# Patient Record
Sex: Female | Born: 1945 | Race: White | Hispanic: No | Marital: Married | Smoking: Former smoker
Health system: Southern US, Community
[De-identification: ages and names within clinical notes are randomized; demographics above are authoritative.]

## PROBLEM LIST (undated history)

## (undated) DIAGNOSIS — J189 Pneumonia, unspecified organism: Secondary | ICD-10-CM

## (undated) DIAGNOSIS — E785 Hyperlipidemia, unspecified: Secondary | ICD-10-CM

## (undated) DIAGNOSIS — C801 Malignant (primary) neoplasm, unspecified: Secondary | ICD-10-CM

## (undated) DIAGNOSIS — F32A Depression, unspecified: Secondary | ICD-10-CM

## (undated) DIAGNOSIS — J449 Chronic obstructive pulmonary disease, unspecified: Secondary | ICD-10-CM

## (undated) DIAGNOSIS — I10 Essential (primary) hypertension: Secondary | ICD-10-CM

## (undated) DIAGNOSIS — M1990 Unspecified osteoarthritis, unspecified site: Secondary | ICD-10-CM

## (undated) DIAGNOSIS — F329 Major depressive disorder, single episode, unspecified: Secondary | ICD-10-CM

## (undated) DIAGNOSIS — Z8719 Personal history of other diseases of the digestive system: Secondary | ICD-10-CM

## (undated) HISTORY — PX: CATARACT EXTRACTION: SUR2

## (undated) HISTORY — PX: COLONOSCOPY: SHX174

## (undated) HISTORY — PX: TONSILLECTOMY: SUR1361

---

## 1997-05-28 HISTORY — PX: CARDIAC CATHETERIZATION: SHX172

## 2000-01-29 ENCOUNTER — Emergency Department (HOSPITAL_COMMUNITY): Admission: EM | Admit: 2000-01-29 | Discharge: 2000-01-29 | Payer: Self-pay | Admitting: Emergency Medicine

## 2000-01-31 ENCOUNTER — Emergency Department (HOSPITAL_COMMUNITY): Admission: EM | Admit: 2000-01-31 | Discharge: 2000-01-31 | Payer: Self-pay

## 2000-05-28 HISTORY — PX: CERVICAL FUSION: SHX112

## 2001-04-21 ENCOUNTER — Encounter: Payer: Self-pay | Admitting: Family Medicine

## 2001-04-21 ENCOUNTER — Encounter: Admission: RE | Admit: 2001-04-21 | Discharge: 2001-04-21 | Payer: Self-pay | Admitting: Family Medicine

## 2001-05-15 ENCOUNTER — Observation Stay (HOSPITAL_COMMUNITY): Admission: RE | Admit: 2001-05-15 | Discharge: 2001-05-16 | Payer: Self-pay | Admitting: Neurosurgery

## 2001-07-01 ENCOUNTER — Encounter: Admission: RE | Admit: 2001-07-01 | Discharge: 2001-09-29 | Payer: Self-pay | Admitting: Neurosurgery

## 2005-06-25 ENCOUNTER — Encounter: Admission: RE | Admit: 2005-06-25 | Discharge: 2005-09-23 | Payer: Self-pay | Admitting: Nurse Practitioner

## 2005-11-21 ENCOUNTER — Other Ambulatory Visit: Admission: RE | Admit: 2005-11-21 | Discharge: 2005-11-21 | Payer: Self-pay | Admitting: *Deleted

## 2005-12-13 ENCOUNTER — Encounter: Admission: RE | Admit: 2005-12-13 | Discharge: 2005-12-13 | Payer: Self-pay | Admitting: Family Medicine

## 2008-06-03 ENCOUNTER — Other Ambulatory Visit: Admission: RE | Admit: 2008-06-03 | Discharge: 2008-06-03 | Payer: Self-pay | Admitting: Family Medicine

## 2009-06-17 ENCOUNTER — Encounter: Admission: RE | Admit: 2009-06-17 | Discharge: 2009-06-17 | Payer: Self-pay | Admitting: Orthopedic Surgery

## 2009-06-20 ENCOUNTER — Encounter: Admission: RE | Admit: 2009-06-20 | Discharge: 2009-06-20 | Payer: Self-pay | Admitting: Family Medicine

## 2009-12-13 ENCOUNTER — Other Ambulatory Visit: Admission: RE | Admit: 2009-12-13 | Discharge: 2009-12-13 | Payer: Self-pay | Admitting: Family Medicine

## 2010-10-13 NOTE — Op Note (Signed)
[place]  Patient:    [name], [name] Visit Number: [id number] MRN: [id number]          Service Type: SUR Location: [id number] Attending Physician:  Cristi Loron Dictated by:   Cristi Loron, M.D. Proc. Date: 05/15/01 Admit Date:  05/15/2001 Discharge Date: 05/16/2001                             Operative Report  PREOPERATIVE DIAGNOSES:  C5-6 herniated nucleus pulposus, spinal stenosis, degenerative disk disease, cervical myelopathy, cervicalgia.  POSTOPERATIVE DIAGNOSES:  C5-6 herniated nucleus pulposus, spinal stenosis, degenerative disk disease, cervical myelopathy, cervicalgia.  PROCEDURE:  C5-6 extensive anterior cervical diskectomy, interbody iliac crest allograft arthrodesis, anterior cervical plating (Codman titanium plate and screws).  SURGEON:  Cristi Loron, M.D.  ANESTHESIA:  General endotracheal.  ESTIMATED BLOOD LOSS:  100 cc.  SPECIMENS:  None.  DRAINS:  None.  COMPLICATIONS:  None.  BRIEF HISTORY:  The patient is a 65 year old white female who has suffered from neck and bilateral arm pain.  She failed medical management and was worked up with a cervical MRI that demonstrated a large herniated disk at C5-6 with spinal cord gliosis at that level.  I recommended surgery.  The patient weighed the risks, benefits, and alternatives of surgery and decided to proceed with the operation.  DESCRIPTION OF PROCEDURE:  The patient was brought to the operating room by the anesthesia team.  General endotracheal anesthesia was induced.  The patient remained in supine position.  A roll was placed under her shoulder to place her neck in slight extension.  Her anterior cervical region was then prepared with Betadine scrub and Betadine solution, sterile drapes were applied.  I then injected the area to be incised with Marcaine with epinephrine solution and used a scalpel to make a transverse incision in the patients  left anterior neck.  I used the Metzenbaum scissors to divide the platysma muscle and then to dissect medial to the sternocleidomastoid muscle, jugular vein, and carotid artery.  I bluntly dissected down toward the anterior cervical spine, carefully identifying the esophagus and retracting it medially.  I cleared the soft tissue from the anterior cervical spine using Kitner swabs.  I inserted a bent spinal needle into the upper exposed interspace and then obtained the intraoperative radiograph to confirm the location.  I then used electrocautery to detach the medial border of the longus colli muscle bilaterally from the C5-6 interspace.  I inserted the Caspar self-retaining retractor for exposure and then used the 15 blade scalpel to incise the C5-6 intervertebral disk.  I performed a partial diskectomy using the pituitary forceps and the Karlin curettes.  I then inserted the distraction screws at C5 and C6 and distracted the C5-6 interspace.  I used the high-speed drill to decorticate the vertebral end plates at C5 and C6 and drill away the remainder of the C5-6 intervertebral disk.  I thinned out the posterior longitudinal ligament with a drill and then incised it with the arachnoid knife.  There was a huge central disk herniation, which I removed with the pituitary forceps.  I then removed the remainder of the posterior longitudinal ligament with the Kerrison punch, undercutting the vertebral end plates, decompressing the thecal sac.  I performed a foraminotomy about the bilateral C6 nerve roots.  Having completed the decompression, I now turned my attention to the arthrodesis.  I obtained the iliac crest  tricortical allograft bone graft and fashioned it to these approximate dimensions:  7 mm in height, 1 cm in depth. I inserted the bone graft in the distracted interspace and then removed the distraction screws.  There was a good, snug fit of the bone graft.  I now turned my attention  to anterior spinal instrumentation.  I obtained the appropriate length Codman anterior cervical plate and laid it along the anterior aspect of the vertebral bodies at C5 and C6, drilled two holes at C5, two at C6, tapped the holes, then secured the plate to the vertebral bodies with two 12 mm screws at each level.  I then obtained the intraoperative radiograph that demonstrated good position of the plate, screws, and interbody graft, and then secured these screws to the plate using the cam tightener.  I then achieved stringent hemostasis using bipolar electrocautery and Gelfoam. I copiously irrigated the wound out with bacitracin solution and removed the solution, then removed the Caspar self-retaining retractor.  I inspected the esophagus for any damage.  There was none evident.  I then reapproximated the patients platysma muscle with interrupted 3-0 Vicryl suture, the subcutaneous tissue with interrupted 3-0 Vicryl suture, and the skin with Steri-Strips and benzoin.  The wound was then coated with bacitracin ointment and sterile dressings applied.  The drapes were removed.  The patient was subsequently extubated by the anesthesia team and transported to the postanesthesia care unit in stable condition.  All sponge, instrument, and needle counts were correct at the end of this case. Dictated by:   Cristi Loron, M.D. Attending Physician:  Tressie Stalker D DD:  05/15/01 TD:  05/17/01 Job: [id number]

## 2011-04-13 ENCOUNTER — Other Ambulatory Visit: Payer: Self-pay | Admitting: Orthopedic Surgery

## 2011-04-13 ENCOUNTER — Ambulatory Visit
Admission: RE | Admit: 2011-04-13 | Discharge: 2011-04-13 | Disposition: A | Discharge status: Home / Self Care | Payer: Self-pay | Source: Ambulatory Visit | Attending: Orthopedic Surgery | Admitting: Orthopedic Surgery

## 2011-04-13 DIAGNOSIS — M79641 Pain in right hand: Secondary | ICD-10-CM

## 2011-06-29 HISTORY — PX: CARPAL TUNNEL RELEASE: SHX101

## 2011-08-10 ENCOUNTER — Encounter (HOSPITAL_BASED_OUTPATIENT_CLINIC_OR_DEPARTMENT_OTHER): Payer: Self-pay | Admitting: *Deleted

## 2011-08-10 NOTE — Progress Notes (Signed)
Had rt ctr [facility] 2/13-they did not do ekg To come in for ekg,bmet

## 2011-08-13 ENCOUNTER — Encounter (HOSPITAL_BASED_OUTPATIENT_CLINIC_OR_DEPARTMENT_OTHER)
Admission: RE | Admit: 2011-08-13 | Discharge: 2011-08-13 | Disposition: A | Discharge status: Home / Self Care | Payer: BC Managed Care – PPO | Source: Ambulatory Visit | Attending: Orthopedic Surgery | Admitting: Orthopedic Surgery

## 2011-08-13 LAB — BASIC METABOLIC PANEL
CO2: 29 mEq/L (ref 19–32)
Chloride: 98 mEq/L (ref 96–112)
GFR calc non Af Amer: >90 mL/min (ref >90)
Glucose, Bld: 159 mg/dL — ABNORMAL HIGH (ref 70–99)
Potassium: 4 mEq/L (ref 3.5–5.1)

## 2011-08-15 ENCOUNTER — Encounter (HOSPITAL_BASED_OUTPATIENT_CLINIC_OR_DEPARTMENT_OTHER): Payer: Self-pay | Admitting: *Deleted

## 2011-08-15 ENCOUNTER — Ambulatory Visit (HOSPITAL_BASED_OUTPATIENT_CLINIC_OR_DEPARTMENT_OTHER)
Admission: RE | Admit: 2011-08-15 | Discharge: 2011-08-15 | Disposition: A | Discharge status: Home / Self Care | Payer: BC Managed Care – PPO | Source: Ambulatory Visit | Attending: Orthopedic Surgery | Admitting: Orthopedic Surgery

## 2011-08-15 ENCOUNTER — Encounter (HOSPITAL_BASED_OUTPATIENT_CLINIC_OR_DEPARTMENT_OTHER)
Admission: RE | Disposition: A | Discharge status: Home / Self Care | Payer: Self-pay | Source: Ambulatory Visit | Attending: Orthopedic Surgery

## 2011-08-15 ENCOUNTER — Encounter (HOSPITAL_BASED_OUTPATIENT_CLINIC_OR_DEPARTMENT_OTHER): Payer: Self-pay | Admitting: Anesthesiology

## 2011-08-15 ENCOUNTER — Ambulatory Visit (HOSPITAL_BASED_OUTPATIENT_CLINIC_OR_DEPARTMENT_OTHER): Payer: BC Managed Care – PPO | Admitting: Anesthesiology

## 2011-08-15 DIAGNOSIS — M7130 Other bursal cyst, unspecified site: Secondary | ICD-10-CM | POA: Insufficient documentation

## 2011-08-15 DIAGNOSIS — Z01810 Encounter for preprocedural cardiovascular examination: Secondary | ICD-10-CM | POA: Insufficient documentation

## 2011-08-15 DIAGNOSIS — E119 Type 2 diabetes mellitus without complications: Secondary | ICD-10-CM | POA: Insufficient documentation

## 2011-08-15 DIAGNOSIS — F3289 Other specified depressive episodes: Secondary | ICD-10-CM | POA: Insufficient documentation

## 2011-08-15 DIAGNOSIS — I10 Essential (primary) hypertension: Secondary | ICD-10-CM | POA: Insufficient documentation

## 2011-08-15 DIAGNOSIS — E785 Hyperlipidemia, unspecified: Secondary | ICD-10-CM | POA: Insufficient documentation

## 2011-08-15 DIAGNOSIS — M129 Arthropathy, unspecified: Secondary | ICD-10-CM | POA: Insufficient documentation

## 2011-08-15 DIAGNOSIS — G5602 Carpal tunnel syndrome, left upper limb: Secondary | ICD-10-CM

## 2011-08-15 DIAGNOSIS — J45909 Unspecified asthma, uncomplicated: Secondary | ICD-10-CM | POA: Insufficient documentation

## 2011-08-15 DIAGNOSIS — G5600 Carpal tunnel syndrome, unspecified upper limb: Secondary | ICD-10-CM | POA: Insufficient documentation

## 2011-08-15 DIAGNOSIS — F329 Major depressive disorder, single episode, unspecified: Secondary | ICD-10-CM | POA: Insufficient documentation

## 2011-08-15 HISTORY — DX: Essential (primary) hypertension: I10

## 2011-08-15 HISTORY — DX: Major depressive disorder, single episode, unspecified: F32.9

## 2011-08-15 HISTORY — DX: Depression, unspecified: F32.A

## 2011-08-15 HISTORY — DX: Hyperlipidemia, unspecified: E78.5

## 2011-08-15 HISTORY — PX: CARPAL TUNNEL RELEASE: SHX101

## 2011-08-15 HISTORY — DX: Unspecified osteoarthritis, unspecified site: M19.90

## 2011-08-15 LAB — GLUCOSE, CAPILLARY: Glucose-Capillary: 150 mg/dL — ABNORMAL HIGH (ref 70–99)

## 2011-08-15 SURGERY — CARPAL TUNNEL RELEASE
Anesthesia: General | Site: Wrist | Laterality: Left | Wound class: Clean

## 2011-08-15 MED ORDER — FENTANYL CITRATE 0.05 MG/ML IJ SOLN: 50.0000 ug | INTRAMUSCULAR | Status: DC | PRN

## 2011-08-15 MED ORDER — BUPIVACAINE HCL (PF) 0.5 % IJ SOLN
INTRAMUSCULAR | Status: DC | PRN
  Administered 2011-08-15: 5 mL

## 2011-08-15 MED ORDER — CHLORHEXIDINE GLUCONATE 4 % EX LIQD: 60.0000 mL | Freq: Once | CUTANEOUS | Status: DC

## 2011-08-15 MED ORDER — CEFAZOLIN SODIUM 1-5 GM-% IV SOLN: 1.0000 g | INTRAVENOUS | Status: DC

## 2011-08-15 MED ORDER — METOCLOPRAMIDE HCL 5 MG/ML IJ SOLN
INTRAMUSCULAR | Status: DC | PRN
  Administered 2011-08-15: 10 mg via INTRAVENOUS

## 2011-08-15 MED ORDER — MIDAZOLAM HCL 2 MG/2ML IJ SOLN: 1.0000 mg | INTRAMUSCULAR | Status: DC | PRN

## 2011-08-15 MED ORDER — LIDOCAINE HCL (PF) 1 % IJ SOLN
INTRAMUSCULAR | Status: DC | PRN
  Administered 2011-08-15: 5 mL

## 2011-08-15 MED ORDER — MIDAZOLAM HCL 5 MG/5ML IJ SOLN
INTRAMUSCULAR | Status: DC | PRN
  Administered 2011-08-15: 2 mg via INTRAVENOUS

## 2011-08-15 MED ORDER — LIDOCAINE HCL (CARDIAC) 20 MG/ML IV SOLN
INTRAVENOUS | Status: DC | PRN
  Administered 2011-08-15: 50 mg via INTRAVENOUS

## 2011-08-15 MED ORDER — FENTANYL CITRATE 0.05 MG/ML IJ SOLN
INTRAMUSCULAR | Status: DC | PRN
  Administered 2011-08-15 (×2): 50 ug via INTRAVENOUS

## 2011-08-15 MED ORDER — PROPOFOL 10 MG/ML IV EMUL
INTRAVENOUS | Status: DC | PRN
  Administered 2011-08-15: 120 mg via INTRAVENOUS

## 2011-08-15 MED ORDER — EPHEDRINE SULFATE 50 MG/ML IJ SOLN
INTRAMUSCULAR | Status: DC | PRN
  Administered 2011-08-15: 10 mg via INTRAVENOUS

## 2011-08-15 MED ORDER — LACTATED RINGERS IV SOLN
INTRAVENOUS | Status: DC
  Administered 2011-08-15 (×2): via INTRAVENOUS

## 2011-08-15 MED ORDER — ONDANSETRON HCL 4 MG/2ML IJ SOLN
INTRAMUSCULAR | Status: DC | PRN
  Administered 2011-08-15: 4 mg via INTRAVENOUS

## 2011-08-15 SURGICAL SUPPLY — 48 items
BANDAGE ELASTIC 3 VELCRO ST LF (GAUZE/BANDAGES/DRESSINGS) ×2 IMPLANT
BANDAGE GAUZE ELAST BULKY 4 IN (GAUZE/BANDAGES/DRESSINGS) ×2 IMPLANT
BLADE SURG 15 STRL LF DISP TIS (BLADE) ×1 IMPLANT
BLADE SURG 15 STRL SS (BLADE) ×2
BNDG CMPR 9X4 STRL LF SNTH (GAUZE/BANDAGES/DRESSINGS) ×1
BNDG ESMARK 4X9 LF (GAUZE/BANDAGES/DRESSINGS) ×2 IMPLANT
CLOTH BEACON ORANGE TIMEOUT ST (SAFETY) ×2 IMPLANT
CORDS BIPOLAR (ELECTRODE) ×1 IMPLANT
COVER MAYO STAND STRL (DRAPES) ×2 IMPLANT
COVER TABLE BACK 60X90 (DRAPES) ×2 IMPLANT
CUFF TOURNIQUET SINGLE 18IN (TOURNIQUET CUFF) ×1 IMPLANT
DRAIN PENROSE 1/4X12 LTX STRL (WOUND CARE) IMPLANT
DRAPE EXTREMITY T 121X128X90 (DRAPE) ×2 IMPLANT
DRAPE SURG 17X23 STRL (DRAPES) ×2 IMPLANT
DRSG EMULSION OIL 3X3 NADH (GAUZE/BANDAGES/DRESSINGS) ×2 IMPLANT
GAUZE SPONGE 4X4 12PLY STRL LF (GAUZE/BANDAGES/DRESSINGS) ×1 IMPLANT
GAUZE SPONGE 4X4 16PLY XRAY LF (GAUZE/BANDAGES/DRESSINGS) IMPLANT
GLOVE BIO SURGEON STRL SZ8 (GLOVE) ×2 IMPLANT
GLOVE BIOGEL PI IND STRL 8 (GLOVE) IMPLANT
GLOVE BIOGEL PI INDICATOR 8 (GLOVE) ×1
GLOVE SURG SS PI 8.0 STRL IVOR (GLOVE) ×1 IMPLANT
GOWN PREVENTION PLUS XLARGE (GOWN DISPOSABLE) ×1 IMPLANT
GOWN PREVENTION PLUS XXLARGE (GOWN DISPOSABLE) ×2 IMPLANT
KNIFE CARPAL TUNNEL (BLADE) ×1 IMPLANT
LOOP VESSEL MAXI BLUE (MISCELLANEOUS) IMPLANT
NDL HYPO 25X1 1.5 SAFETY (NEEDLE) ×1 IMPLANT
NDL SAFETY ECLIPSE 18X1.5 (NEEDLE) ×2 IMPLANT
NEEDLE HYPO 18GX1.5 SHARP (NEEDLE)
NEEDLE HYPO 25X1 1.5 SAFETY (NEEDLE) ×4 IMPLANT
NS IRRIG 1000ML POUR BTL (IV SOLUTION) ×1 IMPLANT
PACK BASIN DAY SURGERY FS (CUSTOM PROCEDURE TRAY) ×2 IMPLANT
PAD ALCOHOL SWAB (MISCELLANEOUS) ×4 IMPLANT
PAD CAST 3X4 CTTN HI CHSV (CAST SUPPLIES) ×1 IMPLANT
PADDING CAST ABS 3INX4YD NS (CAST SUPPLIES)
PADDING CAST ABS 4INX4YD NS (CAST SUPPLIES) ×1
PADDING CAST ABS COTTON 3X4 (CAST SUPPLIES) IMPLANT
PADDING CAST ABS COTTON 4X4 ST (CAST SUPPLIES) ×1 IMPLANT
PADDING CAST COTTON 3X4 STRL (CAST SUPPLIES) ×2
SPLINT FIBERGLASS 3X35 (CAST SUPPLIES) ×1 IMPLANT
STOCKINETTE 4X48 STRL (DRAPES) ×2 IMPLANT
STOCKINETTE SYNTHETIC 3 UNSTER (CAST SUPPLIES) IMPLANT
SUT PROLENE 4 0 PS 2 18 (SUTURE) ×3 IMPLANT
SYR BULB 3OZ (MISCELLANEOUS) ×2 IMPLANT
SYR CONTROL 10ML LL (SYRINGE) ×3 IMPLANT
TOWEL OR 17X24 6PK STRL BLUE (TOWEL DISPOSABLE) ×2 IMPLANT
TRAY DSU PREP LF (CUSTOM PROCEDURE TRAY) ×1 IMPLANT
UNDERPAD 30X30 INCONTINENT (UNDERPADS AND DIAPERS) ×2 IMPLANT
WATER STERILE IRR 1000ML POUR (IV SOLUTION) ×1 IMPLANT

## 2011-08-15 NOTE — Anesthesia Procedure Notes (Addendum)
Performed by: Signa Kell C   Procedure Name: LMA Insertion Date/Time: 08/15/2011 8:44 AM Performed by: Burna Cash Pre-anesthesia Checklist: Patient identified, Emergency Drugs available, Suction available and Patient being monitored Patient Re-evaluated:Patient Re-evaluated prior to inductionOxygen Delivery Method: Circle System Utilized Preoxygenation: Pre-oxygenation with 100% oxygen Intubation Type: IV induction Ventilation: Mask ventilation without difficulty LMA: LMA inserted LMA Size: 4.0 Number of attempts: 1 Airway Equipment and Method: bite block Placement Confirmation: positive ETCO2 Tube secured with: Tape Dental Injury: Teeth and Oropharynx as per pre-operative assessment

## 2011-08-15 NOTE — Op Note (Signed)
[name], [name]                       ACCOUNT NO.:  MEDICAL RECORD NO.:  [id number]  LOCATION:                                 FACILITY:  PHYSICIAN:  Kelsey Done, MD       DATE OF BIRTH:  DATE OF PROCEDURE:  08/15/2011 DATE OF DISCHARGE:                              OPERATIVE REPORT   PREOPERATIVE DIAGNOSES: 1. Left hand carpal tunnel syndrome. 2. Left wrist mass, greater than 3-cm deep mass, subfascial.  POSTOPERATIVE DIAGNOSES: 1. Left hand carpal tunnel syndrome. 2. Left wrist mass, greater than 3-cm deep mass, subfascial.  ATTENDING PHYSICIAN:  Kelsey Covert IV, MD, who scrubbed and present for the entire procedure.  ASSISTANT SURGEON:  None.  ANESTHESIA:  General via LMA.  TOURNIQUET TIME:  Less than 30 minutes at 250 mmHg.  SURGICAL INDICATIONS:  Ms. Baxter is a right-hand-dominant female with signs and symptoms consistent with left hand volar mass as well as carpal tunnel.  The patient elected to undergo the above procedure. Risks, benefits, and alternatives were discussed in detail with the patient and signed informed consent was obtained.  Risks include, but not limited to, bleeding, infection, damage to nearby nerves, arteries, or tendons, loss of motion of the wrist and digits, and need for further surgical intervention.  DESCRIPTION OF THE PROCEDURE:  The patient was appropriately identified in the preop holding area, mark with a permanent marker was made on the left wrist to indicate correct operative site.  The patient was brought back to the operating room and placed supine on the anesthesia room table where general anesthesia was administered.  The patient received preoperative antibiotics.  A well-padded tourniquet was then placed on the left brachium, sealed with 1000 drape.  The left upper extremity was then prepped and draped in normal sterile fashion.  Time-out was called, correct side was identified, and procedure was then begun.   Attention was then turned to left wrist.  Attention was turned to carpal tunnel where a several centimeter incision was made directly in the mid palm. Dissection was then carried down through the skin and subcutaneous tissue.  The palmar fascia was incised longitudinally.  Direct exposure of the transverse carpal ligament was then carried out both proximally and distally, and then under direct visualization, the transverse carpal ligament was released along the ulnar margin.  The contents of the carpal tunnel were inspected.  No other significant abnormalities or space-occupying lesions were noted within the contents of the carpal tunnel.  Following this, the wound was then thoroughly irrigated, several mL of 0.25% Marcaine was infiltrated locally.  The skin was then closed using horizontal mattress nylon sutures.  Attention was then turned to the left, toward the volar mass.  Through a separate incision, a small V-shaped incision, dissection was then carried down through the skin and subcutaneous tissue.  The fascia layer was incised, and deep to the fascia layer, the volar mass was then encountered.  Circumferential dissection was then carried out of the mass all the way down to the radiocarpal joint.  Small joint arthrotomy was then carried out, and the mass was then  excised at the base of its stalk.  No other abnormality was noted upon arthrotomy.  Bipolar cautery was used to cauterize the capsule at the origin of the stalk.  The tourniquet was then deflated. The mass was greater than 3 cm in length.  The wound was then thoroughly irrigated, the tourniquet was then deflated.  After the tourniquet was then deflated, hemostasis was obtained.  The skin was then closed using simple and horizontal mattress Prolene sutures.  Adaptic dressing, sterile compressive bandage were then applied.  The patient tolerated the procedure well, was placed in a short-arm volar splint, and extubated and  taken to recovery room in good condition.  POSTOPERATIVE PLAN:  The patient will be discharged home, seen back in the office in approximately 12 days for wound check, suture removal, and then transition to a short-arm volar splint for the next 2 weeks and then gradually use and activity.  INTRAOPERATIVE SPECIMENS:  Volar mass to pathology.     Kelsey Done, MD     FWO/MEDQ  D:  08/15/2011  T:  08/15/2011  Job:  [id number]

## 2011-08-15 NOTE — Brief Op Note (Signed)
08/15/2011  9:28 AM  PATIENT:  [name]  66 y.o. female  PRE-OPERATIVE DIAGNOSIS:  left carpal tunnel syndrome Left wrist mass   POST-OPERATIVE DIAGNOSIS:  left carpal tunnel syndrome Left wrist mass  PROCEDURE:  Procedure(s) (LRB): CARPAL TUNNEL RELEASE (Left) Left wrist mass excision deep mass  SURGEON:  Surgeon(s) and Role:    * Sharma Covert, MD - Primary  PHYSICIAN ASSISTANT:   ASSISTANTS: none   ANESTHESIA:   general  EBL:  Total I/O In: 1000 [I.V.:1000] Out: -   BLOOD ADMINISTERED:none  DRAINS: none   LOCAL MEDICATIONS USED:  MARCAINE     SPECIMEN:  Source of Specimen:  wrist  DISPOSITION OF SPECIMEN:  PATHOLOGY  COUNTS:  YES  TOURNIQUET:  * Missing tourniquet times found for documented tourniquets in log:  29159 * Total Tourniquet Time Documented: Upper Arm (Left) - 22 minutes  DICTATION: .  PLAN OF CARE: Discharge to home after PACU  PATIENT DISPOSITION:  PACU - hemodynamically stable.   Delay start of Pharmacological VTE agent (>24hrs) due to surgical blood loss or risk of bleeding: not applicable

## 2011-08-15 NOTE — Transfer of Care (Signed)
Immediate Anesthesia Transfer of Care Note  Patient: [name]  Procedure(s) Performed: Procedure(s) (LRB): CARPAL TUNNEL RELEASE (Left)  Patient Location: PACU  Anesthesia Type: General  Level of Consciousness: sedated  Airway & Oxygen Therapy: Patient Spontanous Breathing and Patient connected to face mask oxygen  Post-op Assessment: Report given to PACU RN and Post -op Vital signs reviewed and stable  Post vital signs: Reviewed and stable  Complications: No apparent anesthesia complications

## 2011-08-15 NOTE — Anesthesia Postprocedure Evaluation (Signed)
  Anesthesia Post-op Note  Patient: [name]  Procedure(s) Performed: Procedure(s) (LRB): CARPAL TUNNEL RELEASE (Left)  Patient Location: PACU  Anesthesia Type: General  Level of Consciousness: awake and alert   Airway and Oxygen Therapy: Patient Spontanous Breathing  Post-op Pain: mild  Post-op Assessment: Post-op Vital signs reviewed, Patient's Cardiovascular Status Stable, Respiratory Function Stable, Patent Airway, No signs of Nausea or vomiting and Pain level controlled  Post-op Vital Signs: stable  Complications: No apparent anesthesia complications

## 2011-08-15 NOTE — H&P (Signed)
[name] is an 66 y.o. female.   Chief Complaint: Left wrist mass and s/s c/w left hand carpal tunnel HPI: Pt underwent similar procedure about six weeks ago Identical symptoms on left side pt elects surgery Pt seen in office and office notes reflect history   Past Medical History  Diagnosis Date  . Asthma   . Hypertension   . Diabetes mellitus     diet controlled  . Arthritis   . Depression   . Hyperlipemia     Past Surgical History  Procedure Date  . Cervical fusion 2002  . Tonsillectomy   . Colonoscopy   . Carpal tunnel release 2/13    right-GSC  . Cardiac catheterization 1999    History reviewed. No pertinent family history. Social History:  reports that she has been smoking.  She does not have any smokeless tobacco history on file. She reports that she does not drink alcohol or use illicit drugs.  Allergies:  Allergies  Allergen Reactions  . Penicillins Hives    Medications Prior to Admission  Medication Dose Route Frequency Provider Last Rate Last Dose  . ceFAZolin (ANCEF) IVPB 1 g/50 mL premix  1 g Intravenous 60 min Pre-Op Sharma Covert, MD      . chlorhexidine (HIBICLENS) 4 % liquid 4 application  60 mL Topical Once Sharma Covert, MD      . fentaNYL (SUBLIMAZE) injection 50-100 mcg  50-100 mcg Intravenous PRN Bedelia Person, MD      . lactated ringers infusion   Intravenous Continuous Hart Robinsons, MD 20 mL/hr at 08/15/11 [phone]    . midazolam (VERSED) injection 1-2 mg  1-2 mg Intravenous PRN Bedelia Person, MD       Medications Prior to Admission  Medication Sig Dispense Refill  . Ascorbic Acid (VITAMIN C) 1000 MG tablet Take 1,000 mg by mouth daily.      Marland Kitchen aspirin 81 MG tablet Take 81 mg by mouth daily.      . citalopram (CELEXA) 20 MG tablet Take 20 mg by mouth daily.      Marland Kitchen ipratropium (ATROVENT HFA) 17 MCG/ACT inhaler Inhale 2 puffs into the lungs every 6 (six) hours.      Marland Kitchen omeprazole (PRILOSEC) 20 MG capsule Take 20 mg by mouth daily.      Marland Kitchen  pyridOXINE (VITAMIN B-6) 100 MG tablet Take 100 mg by mouth daily.      . simvastatin (ZOCOR) 40 MG tablet Take 40 mg by mouth every evening.      . valsartan (DIOVAN) 80 MG tablet Take 80 mg by mouth daily.      Marland Kitchen HYDROcodone-acetaminophen (NORCO) 5-325 MG per tablet Take 1 tablet by mouth every 6 (six) hours as needed.        Results for orders placed during the hospital encounter of 08/15/11 (from the past 48 hour(s))  BASIC METABOLIC PANEL     Status: Abnormal   Collection Time   08/13/11 11:30 AM      Component Value Range Comment   Sodium 136  135 - 145 (mEq/L)    Potassium 4.0  3.5 - 5.1 (mEq/L)    Chloride 98  96 - 112 (mEq/L)    CO2 29  19 - 32 (mEq/L)    Glucose, Bld 159 (*) 70 - 99 (mg/dL)    BUN 11  6 - 23 (mg/dL)    Creatinine, Ser 9.60 (*) 0.50 - 1.10 (mg/dL)    Calcium 9.6  8.4 - 10.5 (  mg/dL)    GFR calc non Af Amer >90  >90 (mL/min)    GFR calc Af Amer >90  >90 (mL/min)   POCT HEMOGLOBIN-HEMACUE     Status: Abnormal   Collection Time   08/15/11  7:41 AM      Component Value Range Comment   Hemoglobin 15.7 (*) 12.0 - 15.0 (g/dL)    No results found.  No recent illnesses or hospitalizations  Blood pressure 136/80, pulse 96, temperature 97.8 F (36.6 C), temperature source Oral, resp. rate 18, height 5' (1.524 m), weight 52.164 kg (115 lb), SpO2 93.00%. General Appearance:  Alert, cooperative, no distress, appears stated age  Head:  Normocephalic, without obvious abnormality, atraumatic  Eyes:  Pupils equal, conjunctiva/corneas clear,         Throat: Lips, mucosa, and tongue normal; teeth and gums normal  Neck: No visible masses     Lungs:   respirations unlabored  Chest Wall:  No tenderness or deformity  Heart:  Regular rate and rhythm,  Abdomen:   Soft, non-tender,         Extremities: Left wrist: skin intact, mild thenar atrophy Volar mass adjacent to radial artery Good digital and thumb mobility Fingers warm well perfuse  Pulses: 2+ and symmetric    Skin: Skin color, texture, turgor normal, no rashes or lesions     Neurologic: Normal    Assessment/Plan Left hand carpal tunnel  Left wrist volar mass  Left hand carpal tunnel release Left wrist deep mass excision  R/B/A DISCUSSED WITH PT IN OFFICE.  PT VOICED UNDERSTANDING OF PLAN CONSENT SIGNED DAY OF SURGERY PT SEEN AND EXAMINED PRIOR TO OPERATIVE PROCEDURE/DAY OF SURGERY SITE MARKED. QUESTIONS ANSWERED WILL [facility] FOLLOWING SURGERY  Sharma Covert 08/15/2011, 8:26 AM

## 2011-08-15 NOTE — Anesthesia Preprocedure Evaluation (Signed)
Anesthesia Evaluation  Patient identified by MRN, date of birth, ID band Patient awake    Reviewed: Allergy & Precautions, H&P , NPO status , Patient's Chart, lab work & pertinent test results  Airway Mallampati: I TM Distance: >3 FB Neck ROM: Full    Dental   Pulmonary asthma ,    Pulmonary exam normal       Cardiovascular hypertension,     Neuro/Psych Depression    GI/Hepatic   Endo/Other  Diabetes mellitus-  Renal/GU      Musculoskeletal   Abdominal   Peds  Hematology   Anesthesia Other Findings   Reproductive/Obstetrics                           Anesthesia Physical Anesthesia Plan  ASA: III  Anesthesia Plan: General   Post-op Pain Management:    Induction: Intravenous  Airway Management Planned: LMA  Additional Equipment:   Intra-op Plan:   Post-operative Plan: Extubation in OR  Informed Consent: I have reviewed the patients History and Physical, chart, labs and discussed the procedure including the risks, benefits and alternatives for the proposed anesthesia with the patient or authorized representative who has indicated his/her understanding and acceptance.     Plan Discussed with: CRNA and Surgeon  Anesthesia Plan Comments:         Anesthesia Quick Evaluation

## 2011-08-15 NOTE — Discharge Instructions (Signed)
KEEP BANDAGE CLEAN AND DRY CALL OFFICE FOR F/U APPT [phone] in 12 days KEEP HAND ELEVATED ABOVE HEART OK TO APPLY ICE TO OPERATIVE AREA CONTACT OFFICE IF ANY WORSENING PAIN OR CONCERNS.    Post Anesthesia Home Care Instructions  Activity: Get plenty of rest for the remainder of the day. A responsible adult should stay with you for 24 hours following the procedure.  For the next 24 hours, DO NOT: -Drive a car -Advertising copywriter -Drink alcoholic beverages -Take any medication unless instructed by your physician -Make any legal decisions or sign important papers.  Meals: Start with liquid foods such as gelatin or soup. Progress to regular foods as tolerated. Avoid greasy, spicy, heavy foods. If nausea and/or vomiting occur, drink only clear liquids until the nausea and/or vomiting subsides. Call your physician if vomiting continues.  Special Instructions/Symptoms: Your throat may feel dry or sore from the anesthesia or the breathing tube placed in your throat during surgery. If this causes discomfort, gargle with warm salt water. The discomfort should disappear within 24 hours.    Call your surgeon if you experience:   1.  Fever over 101.0. 2.  Inability to urinate. 3.  Nausea and/or vomiting. 4.  Extreme swelling or bruising at the surgical site. 5.  Continued bleeding from the incision. 6.  Increased pain, redness or drainage from the incision 7.  Problems related to your pain medication.           HAND SURGERY    HOME CARE INSTRUCTIONS    The following instructions have been prepared to help you care for yourself upon your return home today.  Wound Care:  Keep your hand elevated above the level of your heart. Do not allow it to dangle by your side. Keep the dressing dry and do not remove it unless your doctor advises you to do so. He will usually change it at the time of you post-op visit. Moving your fingers is advised to stimulate circulation but will depend on the  site of your surgery. Of course, if you have a splint applied your doctor will advise you about movement.  Activity:  Do not drive or operate machinery today. Rest today and then you may return to your normal activity and work as indicated by your physician.  Diet: Drink liquids today or eat a light diet. You may resume a regular diet tomorrow.  General expectations: Pain for two or three days. Fingers may become slightly swollen.   Unexpected Observations- Call your doctor if any of these occur: Severe pain not relieved by pain medication. Elevated temperature. Dressing soaked with blood. Inability to move fingers. White or bluish color to fingers.  Return to office:  CALL OFFICE FOR F/U APPT [phone] in 12 days

## 2011-08-16 ENCOUNTER — Encounter (HOSPITAL_BASED_OUTPATIENT_CLINIC_OR_DEPARTMENT_OTHER): Payer: Self-pay | Admitting: Orthopedic Surgery

## 2011-09-13 ENCOUNTER — Ambulatory Visit (HOSPITAL_COMMUNITY)
Admission: RE | Admit: 2011-09-13 | Discharge: 2011-09-13 | Disposition: A | Discharge status: Home / Self Care | Payer: BC Managed Care – PPO | Source: Ambulatory Visit | Attending: Family Medicine | Admitting: Family Medicine

## 2011-09-13 DIAGNOSIS — R0989 Other specified symptoms and signs involving the circulatory and respiratory systems: Secondary | ICD-10-CM | POA: Insufficient documentation

## 2011-09-13 MED ORDER — ALBUTEROL SULFATE (5 MG/ML) 0.5% IN NEBU
2.5000 mg | INHALATION_SOLUTION | Freq: Once | RESPIRATORY_TRACT | Status: AC
  Administered 2011-09-13: 2.5 mg via RESPIRATORY_TRACT

## 2013-10-06 ENCOUNTER — Other Ambulatory Visit: Payer: Self-pay | Admitting: Family Medicine

## 2013-10-06 ENCOUNTER — Other Ambulatory Visit (HOSPITAL_COMMUNITY)
Admission: RE | Admit: 2013-10-06 | Discharge: 2013-10-06 | Disposition: A | Discharge status: Home / Self Care | Payer: BC Managed Care – PPO | Source: Ambulatory Visit | Attending: Family Medicine | Admitting: Family Medicine

## 2013-10-06 DIAGNOSIS — Z1151 Encounter for screening for human papillomavirus (HPV): Secondary | ICD-10-CM | POA: Insufficient documentation

## 2013-10-06 DIAGNOSIS — Z124 Encounter for screening for malignant neoplasm of cervix: Secondary | ICD-10-CM | POA: Insufficient documentation

## 2013-10-16 ENCOUNTER — Ambulatory Visit (INDEPENDENT_AMBULATORY_CARE_PROVIDER_SITE_OTHER): Payer: BC Managed Care – PPO | Admitting: Physical Therapy

## 2013-10-16 DIAGNOSIS — M6281 Muscle weakness (generalized): Secondary | ICD-10-CM

## 2013-10-16 DIAGNOSIS — M542 Cervicalgia: Secondary | ICD-10-CM

## 2013-10-16 DIAGNOSIS — M25519 Pain in unspecified shoulder: Secondary | ICD-10-CM

## 2013-10-16 DIAGNOSIS — R5381 Other malaise: Secondary | ICD-10-CM

## 2013-10-27 ENCOUNTER — Encounter (INDEPENDENT_AMBULATORY_CARE_PROVIDER_SITE_OTHER): Payer: BC Managed Care – PPO | Admitting: Physical Therapy

## 2013-10-27 DIAGNOSIS — M25519 Pain in unspecified shoulder: Secondary | ICD-10-CM

## 2013-10-27 DIAGNOSIS — M6281 Muscle weakness (generalized): Secondary | ICD-10-CM

## 2013-10-27 DIAGNOSIS — M542 Cervicalgia: Secondary | ICD-10-CM

## 2013-10-27 DIAGNOSIS — R5381 Other malaise: Secondary | ICD-10-CM

## 2013-11-02 ENCOUNTER — Other Ambulatory Visit: Payer: Self-pay | Admitting: Gastroenterology

## 2013-11-03 ENCOUNTER — Encounter (INDEPENDENT_AMBULATORY_CARE_PROVIDER_SITE_OTHER): Payer: BC Managed Care – PPO | Admitting: Physical Therapy

## 2013-11-03 DIAGNOSIS — M542 Cervicalgia: Secondary | ICD-10-CM

## 2013-11-03 DIAGNOSIS — M25519 Pain in unspecified shoulder: Secondary | ICD-10-CM

## 2013-11-03 DIAGNOSIS — R5381 Other malaise: Secondary | ICD-10-CM

## 2013-11-03 DIAGNOSIS — M6281 Muscle weakness (generalized): Secondary | ICD-10-CM

## 2013-11-16 ENCOUNTER — Encounter (INDEPENDENT_AMBULATORY_CARE_PROVIDER_SITE_OTHER): Payer: BC Managed Care – PPO | Admitting: Physical Therapy

## 2013-11-16 DIAGNOSIS — M6281 Muscle weakness (generalized): Secondary | ICD-10-CM

## 2013-11-16 DIAGNOSIS — M542 Cervicalgia: Secondary | ICD-10-CM

## 2013-11-16 DIAGNOSIS — M25519 Pain in unspecified shoulder: Secondary | ICD-10-CM

## 2013-11-16 DIAGNOSIS — R5381 Other malaise: Secondary | ICD-10-CM

## 2013-11-23 ENCOUNTER — Encounter (INDEPENDENT_AMBULATORY_CARE_PROVIDER_SITE_OTHER): Payer: BC Managed Care – PPO | Admitting: Physical Therapy

## 2013-11-23 DIAGNOSIS — M542 Cervicalgia: Secondary | ICD-10-CM

## 2013-11-23 DIAGNOSIS — M6281 Muscle weakness (generalized): Secondary | ICD-10-CM

## 2013-11-23 DIAGNOSIS — M25519 Pain in unspecified shoulder: Secondary | ICD-10-CM

## 2013-12-01 ENCOUNTER — Encounter (INDEPENDENT_AMBULATORY_CARE_PROVIDER_SITE_OTHER): Payer: BC Managed Care – PPO | Admitting: Physical Therapy

## 2013-12-01 DIAGNOSIS — M6281 Muscle weakness (generalized): Secondary | ICD-10-CM

## 2013-12-01 DIAGNOSIS — M542 Cervicalgia: Secondary | ICD-10-CM

## 2013-12-01 DIAGNOSIS — M25519 Pain in unspecified shoulder: Secondary | ICD-10-CM

## 2013-12-01 DIAGNOSIS — R5381 Other malaise: Secondary | ICD-10-CM

## 2013-12-08 ENCOUNTER — Encounter (INDEPENDENT_AMBULATORY_CARE_PROVIDER_SITE_OTHER): Payer: BC Managed Care – PPO | Admitting: Physical Therapy

## 2013-12-08 DIAGNOSIS — M542 Cervicalgia: Secondary | ICD-10-CM

## 2013-12-08 DIAGNOSIS — R5381 Other malaise: Secondary | ICD-10-CM

## 2013-12-08 DIAGNOSIS — M25519 Pain in unspecified shoulder: Secondary | ICD-10-CM

## 2013-12-08 DIAGNOSIS — M6281 Muscle weakness (generalized): Secondary | ICD-10-CM

## 2013-12-15 ENCOUNTER — Encounter (INDEPENDENT_AMBULATORY_CARE_PROVIDER_SITE_OTHER): Payer: BC Managed Care – PPO | Admitting: Physical Therapy

## 2013-12-15 DIAGNOSIS — M25519 Pain in unspecified shoulder: Secondary | ICD-10-CM

## 2013-12-15 DIAGNOSIS — M6281 Muscle weakness (generalized): Secondary | ICD-10-CM

## 2013-12-15 DIAGNOSIS — R5381 Other malaise: Secondary | ICD-10-CM

## 2013-12-15 DIAGNOSIS — M542 Cervicalgia: Secondary | ICD-10-CM

## 2013-12-22 ENCOUNTER — Encounter (INDEPENDENT_AMBULATORY_CARE_PROVIDER_SITE_OTHER): Payer: BC Managed Care – PPO | Admitting: Physical Therapy

## 2013-12-22 DIAGNOSIS — R5381 Other malaise: Secondary | ICD-10-CM

## 2013-12-22 DIAGNOSIS — M25519 Pain in unspecified shoulder: Secondary | ICD-10-CM

## 2013-12-22 DIAGNOSIS — M6281 Muscle weakness (generalized): Secondary | ICD-10-CM

## 2013-12-22 DIAGNOSIS — M542 Cervicalgia: Secondary | ICD-10-CM

## 2015-10-18 ENCOUNTER — Other Ambulatory Visit: Payer: Self-pay | Admitting: Family Medicine

## 2015-10-18 DIAGNOSIS — Z1231 Encounter for screening mammogram for malignant neoplasm of breast: Secondary | ICD-10-CM

## 2015-11-28 ENCOUNTER — Ambulatory Visit
Admission: RE | Admit: 2015-11-28 | Discharge: 2015-11-28 | Disposition: A | Discharge status: Home / Self Care | Payer: Medicare Other | Source: Ambulatory Visit | Attending: Family Medicine | Admitting: Family Medicine

## 2015-11-28 DIAGNOSIS — Z1231 Encounter for screening mammogram for malignant neoplasm of breast: Secondary | ICD-10-CM

## 2016-06-06 ENCOUNTER — Encounter: Payer: Medicare Other | Attending: Family Medicine | Admitting: *Deleted

## 2016-06-06 DIAGNOSIS — E119 Type 2 diabetes mellitus without complications: Secondary | ICD-10-CM | POA: Insufficient documentation

## 2016-06-06 DIAGNOSIS — Z713 Dietary counseling and surveillance: Secondary | ICD-10-CM | POA: Insufficient documentation

## 2016-06-06 NOTE — Patient Instructions (Addendum)
Plan:  Aim for 2-3 Carb Choices per meal (30-45 grams)  Aim for 0-1 Carbs per snack if hungry  Include protein in moderation with your meals and snacks Consider reading food labels for Total Carbohydrate of foods Consider  increasing your activity level by Arm Chair Exercises or other workout videos on your iPad for 10 minutes daily and increase as tolerated Consider checking BG at alternate times per day including after a meal or exericise on occasion

## 2016-06-06 NOTE — Progress Notes (Signed)
Diabetes Self-Management Education  Visit Type: First/Initial  Appt. Start Time: 0830 Appt. End Time: 1000  06/06/2016  [name], identified by name and date of birth, is a 71 y.o. female with a diagnosis of Diabetes: Type 2. She was diagnosed in 2005 but has been in excellent control the past 10 years. She is aware that her A1c has started to increase but is not aware of the actual value. 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 Willing to exercise but needs a plan of action.  ASSESSMENT  Height 5' (1.524 m), weight 128 lb 1.6 oz (58.1 kg). Body mass index is 25.02 kg/m.      Diabetes Self-Management Education - 06/06/16 0840      Visit Information   Visit Type First/Initial     Initial Visit   Diabetes Type Type 2   Are you taking your medications as prescribed? Not on Medications   Date Diagnosed 2005     Health Coping   How would you rate your overall health? Good     Psychosocial Assessment   Self-care barriers None   Other persons present Patient   Patient Concerns Nutrition/Meal planning;Glycemic Control   Special Needs None   Preferred Learning Style Auditory;Visual;Hands on   How often do you need to have someone help you when you read instructions, pamphlets, or other written materials from your doctor or pharmacy? 1 - Never   What is the last grade level you completed in school? GED     Pre-Education Assessment   Patient understands the diabetes disease and treatment process. Needs Review   Patient understands incorporating nutritional management into lifestyle. Needs Review   Patient undertands incorporating physical activity into lifestyle. Needs Review   Patient understands monitoring blood glucose, interpreting and using results Needs Review   Patient  understands prevention, detection, and treatment of acute complications. Needs Review   Patient understands prevention, detection, and treatment of chronic complications. Needs Review   Patient understands how to develop strategies to address psychosocial issues. Needs Review   Patient understands how to develop strategies to promote health/change behavior. Needs Review     Complications   Last HgB A1C per patient/outside source --  not provided by MD   How often do you check your blood sugar? 0 times/day (not testing)   Have you had a dilated eye exam in the past 12 months? Yes   Have you had a dental exam in the past 12 months? No   Are you checking your feet? Yes   How many days per week are you checking your feet? 7     Dietary Intake   Breakfast unsweetened cereal with 2% milk OR oatmeal occasionally OR infrequently sausage and egg on toast   Snack (morning) not usually now   Lunch sandwich with chicken, cheese and small serving mayo, small serving of chips   Snack (afternoon) no   Dinner meat, starch and always vegetables, occasionally bread or corn bread   Snack (evening) if hungry - PNB crackers OR occasionally something sweet   Beverage(s) coffee, water     Exercise   Exercise Type Light (walking / raking leaves)   How many days per week to you exercise? 1   How many minutes per day do you  exercise? 20   Total minutes per week of exercise 20     Patient Education   Previous Diabetes Education Yes (please comment)  2005   Disease state  Definition of diabetes, type 1 and 2, and the diagnosis of diabetes   Nutrition management  Role of diet in the treatment of diabetes and the relationship between the three main macronutrients and blood glucose level;Food label reading, portion sizes and measuring food.;Carbohydrate counting   Physical activity and exercise  Role of exercise on diabetes management, blood pressure control and cardiac health.;Helped patient identify appropriate  exercises in relation to his/her diabetes, diabetes complications and other health issue.   Monitoring Taught/evaluated SMBG meter.;Identified appropriate SMBG and/or A1C goals.   Chronic complications Relationship between chronic complications and blood glucose control   Psychosocial adjustment Role of stress on diabetes     Individualized Goals (developed by patient)   Nutrition Follow meal plan discussed   Physical Activity Exercise 3-5 times per week   Monitoring  test blood glucose pre and post meals as discussed     Post-Education Assessment   Patient understands the diabetes disease and treatment process. Demonstrates understanding / competency   Patient understands incorporating nutritional management into lifestyle. Demonstrates understanding / competency   Patient undertands incorporating physical activity into lifestyle. Demonstrates understanding / competency   Patient understands using medications safely. Demonstrates understanding / competency   Patient understands monitoring blood glucose, interpreting and using results Demonstrates understanding / competency   Patient understands prevention, detection, and treatment of acute complications. Demonstrates understanding / competency   Patient understands prevention, detection, and treatment of chronic complications. Demonstrates understanding / competency   Patient understands how to develop strategies to address psychosocial issues. Demonstrates understanding / competency   Patient understands how to develop strategies to promote health/change behavior. Demonstrates understanding / competency     Outcomes   Expected Outcomes Demonstrated interest in learning. Expect positive outcomes   Future DMSE PRN   Program Status Completed      Individualized Plan for Diabetes Self-Management Training:   Learning Objective:  Patient will have a greater understanding of diabetes self-management. Patient education plan is to attend  individual and/or group sessions per assessed needs and concerns.   Plan:   Patient Instructions  Plan:  Aim for 2-3 Carb Choices per meal (30-45 grams)  Aim for 0-1 Carbs per snack if hungry  Include protein in moderation with your meals and snacks Consider reading food labels for Total Carbohydrate of foods Consider  increasing your activity level by Arm Chair Exercises or other workout videos on your iPad for 10 minutes daily and increase as tolerated Consider checking BG at alternate times per day including after a meal or exericise on occasion     Expected Outcomes:  Demonstrated interest in learning. Expect positive outcomes  Education material provided: Living Well with Diabetes, A1C conversion sheet, Meal plan card and Carbohydrate counting sheet  If problems or questions, patient to contact team via:  Phone and Email  Future DSME appointment: PRN

## 2017-02-22 ENCOUNTER — Emergency Department (HOSPITAL_COMMUNITY): Payer: Medicare Other

## 2017-02-22 ENCOUNTER — Encounter (HOSPITAL_COMMUNITY): Payer: Self-pay

## 2017-02-22 ENCOUNTER — Inpatient Hospital Stay (HOSPITAL_COMMUNITY)
Admission: EM | Admit: 2017-02-22 | Discharge: 2017-02-26 | DRG: 194 | Disposition: A | Discharge status: Home / Self Care | Payer: Medicare Other | Attending: Internal Medicine | Admitting: Internal Medicine

## 2017-02-22 DIAGNOSIS — J381 Polyp of vocal cord and larynx: Secondary | ICD-10-CM | POA: Diagnosis present

## 2017-02-22 DIAGNOSIS — M1991 Primary osteoarthritis, unspecified site: Secondary | ICD-10-CM | POA: Diagnosis present

## 2017-02-22 DIAGNOSIS — J440 Chronic obstructive pulmonary disease with acute lower respiratory infection: Secondary | ICD-10-CM | POA: Diagnosis present

## 2017-02-22 DIAGNOSIS — E785 Hyperlipidemia, unspecified: Secondary | ICD-10-CM | POA: Diagnosis present

## 2017-02-22 DIAGNOSIS — E119 Type 2 diabetes mellitus without complications: Secondary | ICD-10-CM

## 2017-02-22 DIAGNOSIS — Z7982 Long term (current) use of aspirin: Secondary | ICD-10-CM

## 2017-02-22 DIAGNOSIS — J189 Pneumonia, unspecified organism: Principal | ICD-10-CM | POA: Diagnosis present

## 2017-02-22 DIAGNOSIS — I10 Essential (primary) hypertension: Secondary | ICD-10-CM | POA: Diagnosis present

## 2017-02-22 DIAGNOSIS — F329 Major depressive disorder, single episode, unspecified: Secondary | ICD-10-CM | POA: Diagnosis present

## 2017-02-22 DIAGNOSIS — J449 Chronic obstructive pulmonary disease, unspecified: Secondary | ICD-10-CM | POA: Diagnosis present

## 2017-02-22 DIAGNOSIS — Z79899 Other long term (current) drug therapy: Secondary | ICD-10-CM

## 2017-02-22 DIAGNOSIS — R918 Other nonspecific abnormal finding of lung field: Secondary | ICD-10-CM

## 2017-02-22 DIAGNOSIS — F32A Depression, unspecified: Secondary | ICD-10-CM | POA: Diagnosis present

## 2017-02-22 DIAGNOSIS — R042 Hemoptysis: Secondary | ICD-10-CM | POA: Diagnosis present

## 2017-02-22 DIAGNOSIS — F17200 Nicotine dependence, unspecified, uncomplicated: Secondary | ICD-10-CM | POA: Diagnosis present

## 2017-02-22 DIAGNOSIS — I119 Hypertensive heart disease without heart failure: Secondary | ICD-10-CM | POA: Diagnosis present

## 2017-02-22 DIAGNOSIS — J441 Chronic obstructive pulmonary disease with (acute) exacerbation: Secondary | ICD-10-CM | POA: Diagnosis present

## 2017-02-22 HISTORY — DX: Chronic obstructive pulmonary disease, unspecified: J44.9

## 2017-02-22 LAB — URINALYSIS, ROUTINE W REFLEX MICROSCOPIC
BACTERIA UA: NONE SEEN
BILIRUBIN URINE: NEGATIVE
Glucose, UA: 150 mg/dL — AB
HGB URINE DIPSTICK: NEGATIVE
Ketones, ur: NEGATIVE mg/dL
NITRITE: NEGATIVE
PROTEIN: NEGATIVE mg/dL
Specific Gravity, Urine: 1.016 (ref 1.005–1.030)
pH: 6 (ref 5.0–8.0)

## 2017-02-22 LAB — BASIC METABOLIC PANEL
Anion gap: 15 (ref 5–15)
BUN: 10 mg/dL (ref 6–20)
CALCIUM: 9 mg/dL (ref 8.9–10.3)
CHLORIDE: 94 mmol/L — AB (ref 101–111)
CO2: 26 mmol/L (ref 22–32)
CREATININE: 0.58 mg/dL (ref 0.44–1.00)
Glucose, Bld: 161 mg/dL — ABNORMAL HIGH (ref 65–99)
Potassium: 4.2 mmol/L (ref 3.5–5.1)
SODIUM: 135 mmol/L (ref 135–145)

## 2017-02-22 LAB — CBC WITH DIFFERENTIAL/PLATELET
BASOS ABS: 0 10*3/uL (ref 0.0–0.1)
BASOS PCT: 0 %
Eosinophils Absolute: 0.2 10*3/uL (ref 0.0–0.7)
Eosinophils Relative: 2 %
HEMATOCRIT: 41.3 % (ref 36.0–46.0)
Hemoglobin: 14.2 g/dL (ref 12.0–15.0)
Lymphocytes Relative: 26 %
Lymphs Abs: 3.2 10*3/uL (ref 0.7–4.0)
MCH: 32.9 pg (ref 26.0–34.0)
MCHC: 34.4 g/dL (ref 30.0–36.0)
MCV: 95.8 fL (ref 78.0–100.0)
Monocytes Absolute: 1.2 10*3/uL — ABNORMAL HIGH (ref 0.1–1.0)
Monocytes Relative: 9 %
NEUTROS ABS: 8 10*3/uL — AB (ref 1.7–7.7)
Neutrophils Relative %: 63 %
Platelets: 429 10*3/uL — ABNORMAL HIGH (ref 150–400)
RBC: 4.31 MIL/uL (ref 3.87–5.11)
RDW: 12.2 % (ref 11.5–15.5)
WBC: 12.6 10*3/uL — AB (ref 4.0–10.5)

## 2017-02-22 LAB — TROPONIN I

## 2017-02-22 LAB — I-STAT CG4 LACTIC ACID, ED: Lactic Acid, Venous: 1.11 mmol/L (ref 0.5–1.9)

## 2017-02-22 MED ORDER — ALBUTEROL SULFATE (2.5 MG/3ML) 0.083% IN NEBU
2.5000 mg | INHALATION_SOLUTION | Freq: Once | RESPIRATORY_TRACT | Status: AC
  Administered 2017-02-22: 2.5 mg via RESPIRATORY_TRACT
  Filled 2017-02-22: qty 3

## 2017-02-22 MED ORDER — IPRATROPIUM-ALBUTEROL 0.5-2.5 (3) MG/3ML IN SOLN
3.0000 mL | Freq: Once | RESPIRATORY_TRACT | Status: AC
  Administered 2017-02-22: 3 mL via RESPIRATORY_TRACT
  Filled 2017-02-22: qty 3

## 2017-02-22 NOTE — ED Provider Notes (Signed)
[facility] DEPT Provider Note   CSN: [id number] Arrival date & time: 02/22/17  1533     History   Chief Complaint Chief Complaint  Patient presents with  . Pneumonia  . Cough    HPI [name] is a 71 y.o. female.   Pneumonia   Cough     Pt was seen at 2150. Per pt, c/o gradual onset and persistence of constant "cough" for the past 3+ weeks. Pt states she has been evaluated by her PMD multiple times, dx pneumonia, rx antibiotics (pt on #5/10 day course). Pt's PMD told pt to come to the ED for further evaluation "with a CT scan" and likely admission today for "worsening pneumonia." Has been associated with home fevers to "99" and occasional wheezing. Denies CP/palpitations, no SOB, no abd pain, no N/V/D, no back pain, no calf/LE pain or unilateral swelling.   Past Medical History:  Diagnosis Date  . Arthritis   . Asthma   . COPD (chronic obstructive pulmonary disease) ([place])   . Depression   . Diabetes mellitus    diet controlled  . Hyperlipemia   . Hypertension     There are no active problems to display for this patient.   Past Surgical History:  Procedure Laterality Date  . CARDIAC CATHETERIZATION  1999  . CARPAL TUNNEL RELEASE  2/13   right-[facility]  . CARPAL TUNNEL RELEASE  08/15/2011   Procedure: CARPAL TUNNEL RELEASE;  Surgeon: Linna Hoff, MD;  Location: [facility];  Service: Orthopedics;  Laterality: Left;  . CERVICAL FUSION  2002  . COLONOSCOPY    . TONSILLECTOMY      OB History    No data available       Home Medications    Prior to Admission medications   Medication Sig Start Date End Date Taking? Authorizing Provider  aspirin 81 MG tablet Take 81 mg by mouth daily.   Yes [provider]  citalopram (CELEXA) 20 MG tablet Take 10 mg by mouth daily.    Yes [provider]  losartan (COZAAR) 50 MG tablet Take 50 mg by mouth daily. 12/26/16  Yes [provider]  omeprazole (PRILOSEC) 20 MG capsule Take  20 mg by mouth daily.   Yes [provider]  simvastatin (ZOCOR) 40 MG tablet Take 40 mg by mouth every evening.   Yes [provider]  Ascorbic Acid (VITAMIN C) 1000 MG tablet Take 1,000 mg by mouth daily.    [provider]  ipratropium (ATROVENT HFA) 17 MCG/ACT inhaler Inhale 2 puffs into the lungs every 6 (six) hours.    [provider]  pyridOXINE (VITAMIN B-6) 100 MG tablet Take 100 mg by mouth daily.    [provider]  valsartan (DIOVAN) 80 MG tablet Take 80 mg by mouth daily.    [provider]    Family History Family History  Problem Relation Age of Onset  . Diabetes Mother   . Cancer Father     Social History Social History  Substance Use Topics  . Smoking status: Current Every Day Smoker    Packs/day: 0.50  . Smokeless tobacco: Never Used  . Alcohol use No     Allergies   Penicillins and Tiotropium bromide monohydrate   Review of Systems Review of Systems  Respiratory: Positive for cough.   ROS: Statement: All systems negative except as marked or noted in the HPI; Constitutional: Negative for fever and chills. ; ; Eyes: Negative for eye  pain, redness and discharge. ; ; ENMT: Negative for ear pain, hoarseness, nasal congestion, sinus pressure and sore throat. ; ; Cardiovascular: Negative for chest pain, palpitations, diaphoresis, dyspnea and peripheral edema. ; ; Respiratory: +cough, wheezing. Negative for stridor. ; ; Gastrointestinal: Negative for nausea, vomiting, diarrhea, abdominal pain, blood in stool, hematemesis, jaundice and rectal bleeding. . ; ; Genitourinary: Negative for dysuria, flank pain and hematuria. ; ; Musculoskeletal: Negative for back pain and neck pain. Negative for swelling and trauma.; ; Skin: Negative for pruritus, rash, abrasions, blisters, bruising and skin lesion.; ; Neuro: Negative for headache, lightheadedness and neck stiffness. Negative for weakness, altered level of consciousness,  altered mental status, extremity weakness, paresthesias, involuntary movement, seizure and syncope.        Physical Exam Updated Vital Signs BP 132/63 (BP Location: Right Arm)   Pulse (!) 125   Temp 99.8 F (37.7 C) (Rectal)   Resp (!) 24   Ht 5' (1.524 m)   Wt 54.4 kg (120 lb)   SpO2 94%   BMI 23.44 kg/m   Patient Vitals for the past 24 hrs:  BP Temp Temp src Pulse Resp SpO2 Height Weight  02/22/17 2222 132/63 99.8 F (37.7 C) Rectal (!) 125 (!) 24 94 % - -  02/22/17 1626 - - - - - - 5' (1.524 m) 54.4 kg (120 lb)  02/22/17 1624 132/81 98.3 F (36.8 C) Oral (!) 115 16 95 % - -     Physical Exam 2155: Physical examination:  Nursing notes reviewed; Vital signs and O2 SAT reviewed;  Constitutional: Well developed, Well nourished, Well hydrated, In no acute distress; Head:  Normocephalic, atraumatic; Eyes: EOMI, PERRL, No scleral icterus; ENMT: Mouth and pharynx normal, Mucous membranes moist; Neck: Supple, Full range of motion, No lymphadenopathy; Cardiovascular: Tachycardic rate and rhythm, No gallop; Respiratory: Breath sounds diminished & equal bilaterally, No wheezes. Dry cough during exam.  Speaking full sentences with ease, Normal respiratory effort/excursion; Chest: Nontender, Movement normal; Abdomen: Soft, Nontender, Nondistended, Normal bowel sounds; Genitourinary: No CVA tenderness; Extremities: Pulses normal, No tenderness, No edema, No calf edema or asymmetry.; Neuro: AA&Ox3, Major CN grossly intact.  Speech clear. No gross focal motor or sensory deficits in extremities. Climbs on and off stretcher easily by herself. Gait steady.; Skin: Color normal, Warm, Dry.    ED Treatments / Results  Labs (all labs ordered are listed, but only abnormal results are displayed)   EKG  EKG Interpretation  Date/Time:  Friday February 22 2017 22:23:40 EDT Ventricular Rate:  125 PR Interval:    QRS Duration: 114 QT Interval:  334 QTC Calculation: 482 R Axis:   46 Text  Interpretation:  Sinus tachycardia Ventricular premature complex Consider right atrial enlargement Baseline wander Artifact When compared with ECG of 08/13/2011 Rate faster Confirmed by Francine Graven [phone]) on 02/22/2017 10:38:44 PM       Radiology   Procedures Procedures (including critical care time)  Medications Ordered in ED Medications  ipratropium-albuterol (DUONEB) 0.5-2.5 (3) MG/3ML nebulizer solution 3 mL (3 mLs Nebulization Given 02/22/17 2249)  albuterol (PROVENTIL) (2.5 MG/3ML) 0.083% nebulizer solution 2.5 mg (2.5 mg Nebulization Given 02/22/17 2249)     Initial Impression / Assessment and Plan / ED Course  I have reviewed the triage vital signs and the nursing notes.  Pertinent labs & imaging results that were available during my care of the patient were reviewed by me and considered in my medical decision making (see chart for details).  MDM Reviewed: previous  chart, nursing note and vitals Reviewed previous: labs and ECG Interpretation: labs, ECG and x-ray   Results for orders placed or performed during the hospital encounter of 75/10/25  Basic metabolic panel  Result Value Ref Range   Sodium 135 135 - 145 mmol/L   Potassium 4.2 3.5 - 5.1 mmol/L   Chloride 94 (L) 101 - 111 mmol/L   CO2 26 22 - 32 mmol/L   Glucose, Bld 161 (H) 65 - 99 mg/dL   BUN 10 6 - 20 mg/dL   Creatinine, Ser 0.58 0.44 - 1.00 mg/dL   Calcium 9.0 8.9 - 10.3 mg/dL   GFR calc non Af Amer >60 >60 mL/min   GFR calc Af Amer >60 >60 mL/min   Anion gap 15 5 - 15  CBC with Differential  Result Value Ref Range   WBC 12.6 (H) 4.0 - 10.5 K/uL   RBC 4.31 3.87 - 5.11 MIL/uL   Hemoglobin 14.2 12.0 - 15.0 g/dL   HCT 41.3 36.0 - 46.0 %   MCV 95.8 78.0 - 100.0 fL   MCH 32.9 26.0 - 34.0 pg   MCHC 34.4 30.0 - 36.0 g/dL   RDW 12.2 11.5 - 15.5 %   Platelets 429 (H) 150 - 400 K/uL   Neutrophils Relative % 63 %   Neutro Abs 8.0 (H) 1.7 - 7.7 K/uL   Lymphocytes Relative 26 %   Lymphs Abs 3.2 0.7 -  4.0 K/uL   Monocytes Relative 9 %   Monocytes Absolute 1.2 (H) 0.1 - 1.0 K/uL   Eosinophils Relative 2 %   Eosinophils Absolute 0.2 0.0 - 0.7 K/uL   Basophils Relative 0 %   Basophils Absolute 0.0 0.0 - 0.1 K/uL  Urinalysis, Routine w reflex microscopic  Result Value Ref Range   Color, Urine YELLOW YELLOW   APPearance CLEAR CLEAR   Specific Gravity, Urine 1.016 1.005 - 1.030   pH 6.0 5.0 - 8.0   Glucose, UA 150 (A) NEGATIVE mg/dL   Hgb urine dipstick NEGATIVE NEGATIVE   Bilirubin Urine NEGATIVE NEGATIVE   Ketones, ur NEGATIVE NEGATIVE mg/dL   Protein, ur NEGATIVE NEGATIVE mg/dL   Nitrite NEGATIVE NEGATIVE   Leukocytes, UA TRACE (A) NEGATIVE   RBC / HPF 0-5 0 - 5 RBC/hpf   WBC, UA 0-5 0 - 5 WBC/hpf   Bacteria, UA NONE SEEN NONE SEEN   Squamous Epithelial / LPF 0-5 (A) NONE SEEN   Mucus PRESENT   Troponin I  Result Value Ref Range   Troponin I <0.03 <0.03 ng/mL  I-Stat CG4 Lactic Acid, ED  Result Value Ref Range   Lactic Acid, Venous 1.11 0.5 - 1.9 mmol/L   Dg Chest 2 View Result Date: 02/22/2017 CLINICAL DATA:  Shortness of breath.  Cough. EXAM: CHEST  2 VIEW COMPARISON:  Radiograph earlier this day. Also 02/05/2017, 02/01/2017 FINDINGS: Unchanged right apical opacity from exam earlier this day, which has a slightly mass like appearance. Additional patchy opacities throughout the right mid and upper lung zone as well as left mid lung. There is elevation of the right hemidiaphragm with questionable small effusion. No pneumothorax. The heart is normal in size. There is right hilar prominence. IMPRESSION: Increasing right apical opacity, slightly mass like in appearance. Given increase over the past 3 weeks, concerning for malignancy. Recommend chest CT characterization, preferably with IV contrast. Electronically Signed   By: Jeb Levering M.D.   On: 02/22/2017 22:29    0005:  Short neb given for cough. Pt  remains afebrile, labs with mildly elevated WBC count, normal lactic acid.  CXR with increasing opacity, question mass; CT-A chest pending. Pending pt ambulation with O2 Sat. Dispo based on results. Sign out to Dr. Lita Mains.      Final Clinical Impressions(s) / ED Diagnoses   Final diagnoses:  None    New Prescriptions New Prescriptions   No medications on file      Francine Graven, DO 02/23/17 0012

## 2017-02-22 NOTE — ED Notes (Addendum)
EKG given to EDP,Mcmanus,MD., for review.

## 2017-02-22 NOTE — ED Triage Notes (Signed)
Patient states she has been on antibiotics x 5 days then patient was started on a 10 day dose for pneumonia. Patient was told to come to the ED cause of fever and no improvement.

## 2017-02-23 ENCOUNTER — Emergency Department (HOSPITAL_COMMUNITY): Payer: Medicare Other

## 2017-02-23 ENCOUNTER — Inpatient Hospital Stay (HOSPITAL_COMMUNITY): Payer: Medicare Other

## 2017-02-23 ENCOUNTER — Encounter (HOSPITAL_COMMUNITY): Payer: Self-pay | Admitting: Radiology

## 2017-02-23 DIAGNOSIS — E119 Type 2 diabetes mellitus without complications: Secondary | ICD-10-CM | POA: Diagnosis present

## 2017-02-23 DIAGNOSIS — R9431 Abnormal electrocardiogram [ECG] [EKG]: Secondary | ICD-10-CM | POA: Diagnosis not present

## 2017-02-23 DIAGNOSIS — F329 Major depressive disorder, single episode, unspecified: Secondary | ICD-10-CM | POA: Diagnosis present

## 2017-02-23 DIAGNOSIS — J189 Pneumonia, unspecified organism: Secondary | ICD-10-CM | POA: Diagnosis present

## 2017-02-23 DIAGNOSIS — E785 Hyperlipidemia, unspecified: Secondary | ICD-10-CM | POA: Diagnosis present

## 2017-02-23 DIAGNOSIS — Z7982 Long term (current) use of aspirin: Secondary | ICD-10-CM | POA: Diagnosis not present

## 2017-02-23 DIAGNOSIS — M1991 Primary osteoarthritis, unspecified site: Secondary | ICD-10-CM | POA: Diagnosis present

## 2017-02-23 DIAGNOSIS — J381 Polyp of vocal cord and larynx: Secondary | ICD-10-CM | POA: Diagnosis present

## 2017-02-23 DIAGNOSIS — J441 Chronic obstructive pulmonary disease with (acute) exacerbation: Secondary | ICD-10-CM | POA: Diagnosis present

## 2017-02-23 DIAGNOSIS — F32A Depression, unspecified: Secondary | ICD-10-CM | POA: Diagnosis present

## 2017-02-23 DIAGNOSIS — J449 Chronic obstructive pulmonary disease, unspecified: Secondary | ICD-10-CM | POA: Diagnosis present

## 2017-02-23 DIAGNOSIS — F17200 Nicotine dependence, unspecified, uncomplicated: Secondary | ICD-10-CM | POA: Diagnosis present

## 2017-02-23 DIAGNOSIS — R918 Other nonspecific abnormal finding of lung field: Secondary | ICD-10-CM | POA: Diagnosis present

## 2017-02-23 DIAGNOSIS — I10 Essential (primary) hypertension: Secondary | ICD-10-CM | POA: Diagnosis present

## 2017-02-23 DIAGNOSIS — R042 Hemoptysis: Secondary | ICD-10-CM | POA: Diagnosis present

## 2017-02-23 DIAGNOSIS — I119 Hypertensive heart disease without heart failure: Secondary | ICD-10-CM | POA: Diagnosis present

## 2017-02-23 DIAGNOSIS — Z79899 Other long term (current) drug therapy: Secondary | ICD-10-CM | POA: Diagnosis not present

## 2017-02-23 DIAGNOSIS — J440 Chronic obstructive pulmonary disease with acute lower respiratory infection: Secondary | ICD-10-CM | POA: Diagnosis present

## 2017-02-23 LAB — ECHOCARDIOGRAM COMPLETE
HEIGHTINCHES: 60 in
WEIGHTICAEL: 1920 [oz_av]

## 2017-02-23 LAB — GLUCOSE, CAPILLARY
GLUCOSE-CAPILLARY: 260 mg/dL — AB (ref 65–99)
GLUCOSE-CAPILLARY: 195 mg/dL — AB (ref 65–99)

## 2017-02-23 LAB — STREP PNEUMONIAE URINARY ANTIGEN: Strep Pneumo Urinary Antigen: NEGATIVE

## 2017-02-23 LAB — BRAIN NATRIURETIC PEPTIDE: B NATRIURETIC PEPTIDE 5: 15 pg/mL (ref 0.0–100.0)

## 2017-02-23 MED ORDER — IPRATROPIUM BROMIDE 0.02 % IN SOLN: 0.5000 mg | Freq: Four times a day (QID) | RESPIRATORY_TRACT | Status: DC

## 2017-02-23 MED ORDER — CITALOPRAM HYDROBROMIDE 20 MG PO TABS
10.0000 mg | ORAL_TABLET | Freq: Every day | ORAL | Status: DC
  Administered 2017-02-23 – 2017-02-26 (×4): 10 mg via ORAL
  Filled 2017-02-23 (×4): qty 1

## 2017-02-23 MED ORDER — SODIUM CHLORIDE 0.9 % IV SOLN
INTRAVENOUS | Status: AC
  Administered 2017-02-23: 14:00:00 via INTRAVENOUS

## 2017-02-23 MED ORDER — IPRATROPIUM-ALBUTEROL 0.5-2.5 (3) MG/3ML IN SOLN
3.0000 mL | Freq: Four times a day (QID) | RESPIRATORY_TRACT | Status: DC
  Administered 2017-02-23: 3 mL via RESPIRATORY_TRACT
  Filled 2017-02-23: qty 3

## 2017-02-23 MED ORDER — SODIUM CHLORIDE 0.9 % IV SOLN
500.0000 mg | Freq: Two times a day (BID) | INTRAVENOUS | Status: DC
  Administered 2017-02-23 – 2017-02-24 (×3): 500 mg via INTRAVENOUS
  Filled 2017-02-23 (×4): qty 500

## 2017-02-23 MED ORDER — DEXTROSE 5 % IV SOLN
2.0000 g | Freq: Two times a day (BID) | INTRAVENOUS | Status: DC
  Administered 2017-02-23: 2 g via INTRAVENOUS
  Filled 2017-02-23: qty 2

## 2017-02-23 MED ORDER — POLYETHYLENE GLYCOL 3350 17 G PO PACK
17.0000 g | PACK | Freq: Every day | ORAL | Status: DC
  Administered 2017-02-24 – 2017-02-26 (×2): 17 g via ORAL
  Filled 2017-02-23 (×2): qty 1

## 2017-02-23 MED ORDER — IOPAMIDOL (ISOVUE-370) INJECTION 76%
100.0000 mL | Freq: Once | INTRAVENOUS | Status: AC | PRN
  Administered 2017-02-23: 100 mL via INTRAVENOUS

## 2017-02-23 MED ORDER — ALBUTEROL SULFATE (2.5 MG/3ML) 0.083% IN NEBU: 2.5000 mg | INHALATION_SOLUTION | Freq: Four times a day (QID) | RESPIRATORY_TRACT | Status: DC

## 2017-02-23 MED ORDER — ALBUTEROL SULFATE (2.5 MG/3ML) 0.083% IN NEBU: 2.5000 mg | INHALATION_SOLUTION | RESPIRATORY_TRACT | Status: DC | PRN

## 2017-02-23 MED ORDER — IOPAMIDOL (ISOVUE-370) INJECTION 76%
INTRAVENOUS | Status: AC
  Filled 2017-02-23: qty 100

## 2017-02-23 MED ORDER — PANTOPRAZOLE SODIUM 40 MG PO TBEC
40.0000 mg | DELAYED_RELEASE_TABLET | Freq: Every day | ORAL | Status: DC
  Administered 2017-02-24 – 2017-02-26 (×3): 40 mg via ORAL
  Filled 2017-02-23 (×4): qty 1

## 2017-02-23 MED ORDER — AZTREONAM IN DEXTROSE 1 GM/50ML IV SOLN
1.0000 g | Freq: Three times a day (TID) | INTRAVENOUS | Status: DC
  Administered 2017-02-23 – 2017-02-25 (×5): 1 g via INTRAVENOUS
  Filled 2017-02-23 (×7): qty 50

## 2017-02-23 MED ORDER — SODIUM CHLORIDE 0.9 % IV BOLUS (SEPSIS)
1000.0000 mL | Freq: Once | INTRAVENOUS | Status: AC
  Administered 2017-02-23: 1000 mL via INTRAVENOUS

## 2017-02-23 MED ORDER — VANCOMYCIN HCL IN DEXTROSE 1-5 GM/200ML-% IV SOLN
1000.0000 mg | Freq: Once | INTRAVENOUS | Status: AC
  Administered 2017-02-23: 1000 mg via INTRAVENOUS
  Filled 2017-02-23: qty 200

## 2017-02-23 MED ORDER — INSULIN ASPART 100 UNIT/ML ~~LOC~~ SOLN
0.0000 [IU] | Freq: Every day | SUBCUTANEOUS | Status: DC
  Administered 2017-02-24: 3 [IU] via SUBCUTANEOUS
  Administered 2017-02-25: 2 [IU] via SUBCUTANEOUS

## 2017-02-23 MED ORDER — ONDANSETRON HCL 4 MG/2ML IJ SOLN: 4.0000 mg | Freq: Four times a day (QID) | INTRAMUSCULAR | Status: DC | PRN

## 2017-02-23 MED ORDER — SODIUM CHLORIDE 0.9 % IV BOLUS (SEPSIS): 1000.0000 mL | Freq: Once | INTRAVENOUS | Status: DC

## 2017-02-23 MED ORDER — HEPARIN SODIUM (PORCINE) 5000 UNIT/ML IJ SOLN
5000.0000 [IU] | Freq: Three times a day (TID) | INTRAMUSCULAR | Status: DC
  Administered 2017-02-23: 5000 [IU] via SUBCUTANEOUS
  Filled 2017-02-23 (×2): qty 1

## 2017-02-23 MED ORDER — GUAIFENESIN ER 600 MG PO TB12
1200.0000 mg | ORAL_TABLET | Freq: Two times a day (BID) | ORAL | Status: DC
  Administered 2017-02-23 – 2017-02-26 (×6): 1200 mg via ORAL
  Filled 2017-02-23 (×6): qty 2

## 2017-02-23 MED ORDER — ASPIRIN EC 81 MG PO TBEC
81.0000 mg | DELAYED_RELEASE_TABLET | Freq: Every day | ORAL | Status: DC
  Filled 2017-02-23 (×2): qty 1

## 2017-02-23 MED ORDER — INSULIN ASPART 100 UNIT/ML ~~LOC~~ SOLN
0.0000 [IU] | Freq: Three times a day (TID) | SUBCUTANEOUS | Status: DC
  Administered 2017-02-24: 2 [IU] via SUBCUTANEOUS
  Administered 2017-02-25: 3 [IU] via SUBCUTANEOUS
  Administered 2017-02-25: 1 [IU] via SUBCUTANEOUS
  Administered 2017-02-26: 2 [IU] via SUBCUTANEOUS
  Administered 2017-02-26: 1 [IU] via SUBCUTANEOUS

## 2017-02-23 MED ORDER — IPRATROPIUM-ALBUTEROL 0.5-2.5 (3) MG/3ML IN SOLN
3.0000 mL | Freq: Three times a day (TID) | RESPIRATORY_TRACT | Status: DC
  Administered 2017-02-23 – 2017-02-24 (×3): 3 mL via RESPIRATORY_TRACT
  Filled 2017-02-23 (×4): qty 3

## 2017-02-23 MED ORDER — LOSARTAN POTASSIUM 50 MG PO TABS
50.0000 mg | ORAL_TABLET | Freq: Every day | ORAL | Status: DC
  Administered 2017-02-24 – 2017-02-26 (×3): 50 mg via ORAL
  Filled 2017-02-23 (×4): qty 1

## 2017-02-23 MED ORDER — ONDANSETRON HCL 4 MG PO TABS: 4.0000 mg | ORAL_TABLET | Freq: Four times a day (QID) | ORAL | Status: DC | PRN

## 2017-02-23 MED ORDER — SIMVASTATIN 40 MG PO TABS
40.0000 mg | ORAL_TABLET | Freq: Every evening | ORAL | Status: DC
  Administered 2017-02-23 – 2017-02-25 (×3): 40 mg via ORAL
  Filled 2017-02-23 (×3): qty 1

## 2017-02-23 NOTE — Progress Notes (Signed)
  Echocardiogram 2D Echocardiogram has been performed.   [name] M 02/23/2017, 8:47 AM

## 2017-02-23 NOTE — H&P (Signed)
History and Physical    RAYEN PALEN [facility]:[id number] DOB: July 25, 1945 DOA: 02/22/2017  PCP: [name], MD   Patient coming from: Home.  I have personally briefly reviewed patient's old medical records in [place]  Chief Complaint: Pneumonia.  HPI: [name] is a 71 y.o. female with medical history significant of osteoarthritis, asthma, COPD, depression, diet-controlled type 2 diabetes, hyperlipidemia, hypertension who is coming to the emergency department with complaints of progressively worse dyspnea, productive cough, decreased appetite, fatigue, hemoptysis for almost a month, fever today despite using oral antibiotics for several days (Levaquin). She also mentions a weight loss of 4 pounds in the last month. She denies headache, sore throat, chest pain, palpitations, dizziness, diaphoresis or pitting edema of the lower extremities. Denies abdominal pain, nausea, emesis, diarrhea, constipation, dysuria, frequency, hematuria, polyuria, polydipsia or blurred vision.  ED Course: Initial vital signs temperature 98.46F, pulse 115, blood pressure 132/81 mmHg, respirations 16 and O2 sat 95% on room air. She received a DuoNeb and albuterol 2.5 mg nebulizer treatment.  Her workup shows some urinalysis with trace leukocyte esterase. WBC 12.6 with 63% neutrophils, hemoglobin 14.2 g/dL and platelets 429. Troponin was normal. EKG shows sinus tachycardia at 125 BPM, VPC's with right atrial enlargement pattern. When compared to previous injury did not show significant changes, except for a faster rate.  Review of Systems: As per HPI otherwise 10 point review of systems negative.    Past Medical History:  Diagnosis Date  . Arthritis   . Asthma   . COPD (chronic obstructive pulmonary disease) ([place])   . Depression   . Diabetes mellitus    diet controlled  . Hyperlipemia   . Hypertension     Past Surgical History:  Procedure Laterality Date  . CARDIAC CATHETERIZATION  1999  . CARPAL  TUNNEL RELEASE  2/13   right-GSC  . CARPAL TUNNEL RELEASE  08/15/2011   Procedure: CARPAL TUNNEL RELEASE;  Surgeon: Linna Hoff, MD;  Location: [facility];  Service: Orthopedics;  Laterality: Left;  . CERVICAL FUSION  2002  . COLONOSCOPY    . TONSILLECTOMY       reports that she has been smoking.  She has been smoking about 0.50 packs per day. She has never used smokeless tobacco. She reports that she does not drink alcohol or use drugs.  Allergies  Allergen Reactions  . Penicillins Hives    Has patient had a PCN reaction causing immediate rash, facial/tongue/throat swelling, SOB or lightheadedness with hypotension: no Has patient had a PCN reaction causing severe rash involving mucus membranes or skin necrosis: no Has patient had a PCN reaction that required hospitalization: yes Has patient had a PCN reaction occurring within the last 10 years: no If all of the above answers are "NO", then may proceed with Cephalosporin use.   . Tiotropium Bromide Monohydrate Other (See Comments)    EYE PAIN KIDNEY FUNCTION SLOWED DOWN   Family History  Problem Relation Age of Onset  . Diabetes Mother   . Colon cancer Father   . Cerebral palsy Brother   . Breast cancer Paternal Aunt   . Breast cancer Cousin   . Prostate cancer Paternal Uncle     Prior to Admission medications   Medication Sig Start Date End Date Taking? Authorizing Provider  aspirin 81 MG tablet Take 81 mg by mouth daily.   Yes [provider]  citalopram (CELEXA) 20 MG tablet Take 10 mg by mouth daily.  Yes [provider]  losartan (COZAAR) 50 MG tablet Take 50 mg by mouth daily. 12/26/16  Yes [provider]  omeprazole (PRILOSEC) 20 MG capsule Take 20 mg by mouth daily.   Yes [provider]  simvastatin (ZOCOR) 40 MG tablet Take 40 mg by mouth every evening.   Yes [provider]  Ascorbic Acid (VITAMIN C) 1000 MG tablet Take 1,000 mg by mouth daily.     [provider]  ipratropium (ATROVENT HFA) 17 MCG/ACT inhaler Inhale 2 puffs into the lungs every 6 (six) hours.    [provider]  pyridOXINE (VITAMIN B-6) 100 MG tablet Take 100 mg by mouth daily.    [provider]  valsartan (DIOVAN) 80 MG tablet Take 80 mg by mouth daily.    [provider]    Physical Exam: Vitals:   02/22/17 1624 02/22/17 1626 02/22/17 2222 02/23/17 0156  BP: 132/81  132/63 110/60  Pulse: (!) 115  (!) 125 (!) 106  Resp: 16  (!) 24 19  Temp: 98.3 F (36.8 C)  99.8 F (37.7 C) 97.7 F (36.5 C)  TempSrc: Oral  Rectal Oral  SpO2: 95%  94% 95%  Weight:  54.4 kg (120 lb)    Height:  5' (1.524 m)      Constitutional: Looks chronically ill. Eyes: PERRL, lids and conjunctivae normal ENMT: Mucous membranes are mildly dry. Posterior pharynx clear of any exudate or lesions. Neck: normal, supple, no masses, no thyromegaly Respiratory: Decreased breath sounds with wheezing and rhonchi bilaterally, no wheezing. Normal respiratory effort. No accessory muscle use.  Cardiovascular: Tachycardic at 106 BPM, no murmurs / rubs / gallops. No extremity edema. 2+ pedal pulses. No carotid bruits.  Abdomen: Soft, no tenderness, no masses palpated. No hepatosplenomegaly. Bowel sounds positive.  Musculoskeletal: Mild clubbing, no cyanosis. Good ROM, no contractures. Normal muscle tone.  Skin: no rashes, lesions, ulcers on limited skin exam. Neurologic: CN 2-12 grossly intact. Sensation intact, DTR normal. Strength 5/5 in all 4.  Psychiatric: Normal judgment and insight. Alert and oriented x 4. Normal mood.    Labs on Admission: I have personally reviewed following labs and imaging studies  CBC:  Recent Labs Lab 02/22/17 2249  WBC 12.6*  NEUTROABS 8.0*  HGB 14.2  HCT 41.3  MCV 95.8  PLT 546*   Basic Metabolic Panel:  Recent Labs Lab 02/22/17 2249  NA 135  K 4.2  CL 94*  CO2 26  GLUCOSE 161*  BUN 10  CREATININE 0.58  CALCIUM  9.0   GFR: Estimated Creatinine Clearance: 47.7 mL/min (by C-G formula based on SCr of 0.58 mg/dL). Liver Function Tests: No results for input(s): AST, ALT, ALKPHOS, BILITOT, PROT, ALBUMIN in the last 168 hours. No results for input(s): LIPASE, AMYLASE in the last 168 hours. No results for input(s): AMMONIA in the last 168 hours. Coagulation Profile: No results for input(s): INR, PROTIME in the last 168 hours. Cardiac Enzymes:  Recent Labs Lab 02/22/17 2249  TROPONINI <0.03   BNP (last 3 results) No results for input(s): PROBNP in the last 8760 hours. HbA1C: No results for input(s): HGBA1C in the last 72 hours. CBG: No results for input(s): GLUCAP in the last 168 hours. Lipid Profile: No results for input(s): CHOL, HDL, LDLCALC, TRIG, CHOLHDL, LDLDIRECT in the last 72 hours. Thyroid Function Tests: No results for input(s): TSH, T4TOTAL, FREET4, T3FREE, THYROIDAB in the last 72 hours. Anemia Panel: No results for input(s): VITAMINB12, FOLATE, FERRITIN, TIBC, IRON, RETICCTPCT in  the last 72 hours. Urine analysis:    Component Value Date/Time   COLORURINE YELLOW 02/22/2017 2152   APPEARANCEUR CLEAR 02/22/2017 2152   LABSPEC 1.016 02/22/2017 2152   PHURINE 6.0 02/22/2017 2152   GLUCOSEU 150 (A) 02/22/2017 2152   HGBUR NEGATIVE 02/22/2017 2152   BILIRUBINUR NEGATIVE 02/22/2017 2152   KETONESUR NEGATIVE 02/22/2017 2152   PROTEINUR NEGATIVE 02/22/2017 2152   NITRITE NEGATIVE 02/22/2017 2152   LEUKOCYTESUR TRACE (A) 02/22/2017 2152    Radiological Exams on Admission: Dg Chest 2 View  Result Date: 02/22/2017 CLINICAL DATA:  Shortness of breath.  Cough. EXAM: CHEST  2 VIEW COMPARISON:  Radiograph earlier this day. Also 02/05/2017, 02/01/2017 FINDINGS: Unchanged right apical opacity from exam earlier this day, which has a slightly mass like appearance. Additional patchy opacities throughout the right mid and upper lung zone as well as left mid lung. There is elevation of the  right hemidiaphragm with questionable small effusion. No pneumothorax. The heart is normal in size. There is right hilar prominence. IMPRESSION: Increasing right apical opacity, slightly mass like in appearance. Given increase over the past 3 weeks, concerning for malignancy. Recommend chest CT characterization, preferably with IV contrast. Electronically Signed   By: Jeb Levering M.D.   On: 02/22/2017 22:29   Ct Angio Chest Pe W/cm &/or Wo Cm  Result Date: 02/23/2017 CLINICAL DATA:  Persistent cough.  Fever. EXAM: CT ANGIOGRAPHY CHEST WITH CONTRAST TECHNIQUE: Multidetector CT imaging of the chest was performed using the standard protocol during bolus administration of intravenous contrast. Multiplanar CT image reconstructions and MIPs were obtained to evaluate the vascular anatomy. CONTRAST:  100 cc Isovue 370 IV COMPARISON:  Chest radiograph yesterday, additional priors. FINDINGS: Cardiovascular: There are no filling defects within the pulmonary arteries to suggest pulmonary embolus. Right hilar mass encases the right pulmonary artery causing mild luminal narrowing, however no evidence of invasion. Aortic atherosclerosis without aneurysm. The heart is normal in size. Small pericardial effusion measures up to 8 mm in depth. Right superior mediastinal soft tissue density causes mass effect on the IVC with narrowing, possible IVC invasion image 115 series 6. Contrast refluxes into the hemi azygous system. Mediastinum/Nodes: Right apical masslike opacity is contiguous with right hilar nodal tissue, discrete nodes are difficult to differentiate. Anterior right hilar mass measures approximately 3.4 x 2.0 cm. This mass causes significant narrowing of the right upper lobe bronchus and encases the right main pulmonary artery. A lower paratracheal node measures 7 mm short axis. Prominent subcarinal node measures approximately 12 mm. There is no left hilar adenopathy. No definite supraclavicular adenopathy. No  axillary adenopathy. The esophagus is decompressed. Visualized thyroid gland is normal. Lungs/Pleura: Ill-defined right apical masslike opacity measures 3.5 x 3.1 cm with irregular spiculated borders and surrounding ground-glass opacity. Spiculations and ground-glass opacity extend to the pleural surface peripherally anteriorly. This is contiguous with right hilar soft tissue density that measures approximately 3.4 x 2.0 cm. There is postobstructive atelectasis of the anterior right upper lobe. Right hilar mass causes significant narrowing of the right upper lobe bronchus with subsequent volume loss in the right lung. Within the anterior left upper lobe is a 2.1 x 1.8 cm spiculated nodule with central air consistent with necrosis. Mild emphysema.  No pleural fluid. Upper Abdomen: Heterogeneous right adrenal nodule measures 2.6 x 1.4 cm. Left adrenal gland not included in the field of view. Low-density adjacent with falciform ligament in the liver, typically focal fatty infiltration. Musculoskeletal: No evidence of osseous metastatic disease. No blastic  or destructive lytic lesions. Age related degenerative change in the spine. Review of the MIP images confirms the above findings. IMPRESSION: 1. Right apical masslike opacity, contiguous with right hilar soft tissue density, consistent with bronchogenic malignancy. Unclear whether the primary lesion is the apical or hilar mass. Right hilar mass encases the right upper lobe bronchus causing luminal narrowing as well as narrowing of the right pulmonary artery. There is mass effect on the IVC with possible invasion. Postobstructive atelectasis of the anterior right upper lung. 2. Necrotic left lung nodule measuring 2.1 cm may be a satellite nodule or second primary bronchogenic malignancy. 3. Right adrenal nodule, suspicious for metastatic disease but technically indeterminate. 4. No pulmonary embolus. 5. Aortic Atherosclerosis (ICD10-I70.0) and Emphysema (ICD10-J43.9).  Electronically Signed   By: Jeb Levering M.D.   On: 02/23/2017 01:46    EKG: Independently reviewed. Vent. rate 125 BPM PR interval * ms QRS duration 114 ms QT/QTc 334/482 ms P-R-T axes 71 46 94 Sinus tachycardia Ventricular premature complex Consider right atrial enlargement Baseline wander Artifact Faster rate when compared to previous.  Assessment/Plan Principal Problem:   Postobstructive pneumonia Admit to telemetry/inpatient. Continue supplemental oxygen. Gentle IV hydration. Continue scheduled and as needed bronchodilators. Blood cultures 2. Aztreonam and vancomycin per pharmacy. Check sputum Gram stain, culture and sensitivity. Check strep pneumoniae urinary antigen.  Active Problems:   Lung mass Informed the patient about the likelihood of malignancy. Please consult pulmonology in the morning.    Tobacco use Declined nicotine replacement therapy.    COPD (chronic obstructive pulmonary disease) (HCC) Continue supplemental oxygen. Bronchodilators as needed.    Hyperlipemia Continue simvastatin 40 mg by mouth daily for pharmacy formulary equivalent. Follow-up LFTs as needed.    Hypertension Continue losartan 50 mg by mouth daily. Monitor for low pressure, renal function and electrolytes.    Depression Continue Celexa 20 mg by mouth daily..    Type 2 diabetes mellitus (HCC) Carbohydrate modified diet. CBG monitoring. Start regular insulin sliding scale if needed.     DVT prophylaxis: Heparin SQ. Code Status: Full code. Family Communication:  Disposition Plan: Admit for IV antibiotic therapy and further workup. Consults called:  Admission status: Inpatient/telemetry.   Reubin Milan MD [facility] Pager [phone]  If 7PM-7AM, please contact night-coverage [email or web address] Password [email or web address]  02/23/2017, 4:00 AM

## 2017-02-23 NOTE — Consult Note (Signed)
[facility] Consult  Physician Requesting Consult:  Berle Mull, M.D. / [facility]  Date of Consult:  02/23/2017  Reason for Consult/Chief Complaint:  Lung Mass w/ Hemoptysis  History of Presenting Illness:  71 y.o. female with known history of COPD as well as ongoing tobacco use disorder. Patient was treated as an outpatient with Levaquin for progressively worsening dyspnea and productive cough with hemoptysis. Patient endorsed a 4 pound weight loss over the last month on presentation. She also endorsed increased fatigue and decreased appetite. In the emergency department the patient was mildly tachycardic and afebrile saturating normally on room air. Workup performed included a CT angiogram of the chest revealing what appears to be a postobstructive pneumonia and lung mass suspicious for malignancy. This prompted pulmonary consultation.  The patient reports that she was seen by her physician in June and at that time was gaining weight. This prompted scheduling a follow-up appointment approximately 3 months afterwards. She reports that at the end of August and approximately one week before her follow-up appointment on 9/7 she began to experience anterior chest discomfort especially with bending over. Patient also endorsed dyspnea with exertion and with bending over. The patient's chest discomfort has since resolved but her dyspnea remains and currently is at baseline. She was evaluated by her primary care physician on 9/7 and with a leukocytosis as well as abnormal x-ray treated with 5 days of Levaquin. With continued leukocytosis and her regimen was subsequently transitioned to 10 days of doxycycline when she began to note improvement in her symptoms particularly her cough. She reports that she has lost approximately 4 pounds but admittedly has been trying to. She endorses chronic sweats but has also experienced chills lately. Patient reports her cough is at baseline and is productive of a  thick sputum in the morning with a blood tinge. The remainder of the day her cough produces a green and yellow mucus that is thick as well. She denies any perceived lymphadenopathy in her neck, groin, or axilla. He does endorse some mild throat pain. She denies any bone pain. She denies any dysphagia or odynophagia although she reportedly did aspirate on some food approximately 2 months ago. She denies any headache or acute vision changes. She denies any focal weakness, numbness, or tingling. She denies any abdominal pain or nausea. She denies any melena or hematochezia.  Review of Systems:  No dysuria or hematuria. No rashes or abnormal bruising. A pertinent 14 point review of systems is negative except as per the history of presenting illness.  Allergies  Allergen Reactions  . Penicillins Hives    Has patient had a PCN reaction causing immediate rash, facial/tongue/throat swelling, SOB or lightheadedness with hypotension: no Has patient had a PCN reaction causing severe rash involving mucus membranes or skin necrosis: no Has patient had a PCN reaction that required hospitalization: yes Has patient had a PCN reaction occurring within the last 10 years: no If all of the above answers are "NO", then may proceed with Cephalosporin use.   . Tiotropium Bromide Monohydrate Other (See Comments)    EYE PAIN KIDNEY FUNCTION SLOWED DOWN    No current facility-administered medications on file prior to encounter.    Current Outpatient Prescriptions on File Prior to Encounter  Medication Sig Dispense Refill  . aspirin 81 MG tablet Take 81 mg by mouth daily.    . citalopram (CELEXA) 20 MG tablet Take 10 mg by mouth daily.     Marland Kitchen omeprazole (PRILOSEC) 20 MG  capsule Take 20 mg by mouth daily.    . simvastatin (ZOCOR) 40 MG tablet Take 40 mg by mouth every evening.    . Ascorbic Acid (VITAMIN C) 1000 MG tablet Take 1,000 mg by mouth daily.    Marland Kitchen ipratropium (ATROVENT HFA) 17 MCG/ACT inhaler Inhale 2 puffs  into the lungs every 6 (six) hours.    . pyridOXINE (VITAMIN B-6) 100 MG tablet Take 100 mg by mouth daily.    . valsartan (DIOVAN) 80 MG tablet Take 80 mg by mouth daily.      Past Medical History:  Diagnosis Date  . Arthritis   . Asthma   . COPD (chronic obstructive pulmonary disease) ([place])   . Depression   . Diabetes mellitus    diet controlled  . Hyperlipemia   . Hypertension     Past Surgical History:  Procedure Laterality Date  . CARDIAC CATHETERIZATION  1999  . CARPAL TUNNEL RELEASE  2/13   right-GSC  . CARPAL TUNNEL RELEASE  08/15/2011   Procedure: CARPAL TUNNEL RELEASE;  Surgeon: Linna Hoff, MD;  Location: [facility];  Service: Orthopedics;  Laterality: Left;  . CATARACT EXTRACTION    . CERVICAL FUSION  2002  . COLONOSCOPY    . TONSILLECTOMY      Family History  Problem Relation Age of Onset  . Diabetes Mother   . Colon cancer Father   . Cerebral palsy Brother   . Breast cancer Paternal Aunt   . Breast cancer Cousin   . Prostate cancer Paternal Uncle   . Bone cancer Maternal Aunt     Social History   Social History  . Marital status: Married    Spouse name: N/A  . Number of children: N/A  . Years of education: N/A   Social History Main Topics  . Smoking status: Current Every Day Smoker    Packs/day: 1.00    Years: 53.00    Start date: 12/22/1962  . Smokeless tobacco: Never Used     Comment: Peak rate 1.5ppd - quit at most 6 months  . Alcohol use No  . Drug use: No  . Sexual activity: Not Asked   Other Topics Concern  . None   Social History Narrative   [facility] (02/23/17):   Originally from [place]. Previously has lived in [place] as well as [place]. Previously worked with a Engineer, structural and in Engineer, materials. Remote exposure to a parrot. Currently lives with her husband. Retired.    Temp:  [97.7 F (36.5 C)-99.8 F (37.7 C)] 97.7 F (36.5 C) (09/29 0156) Pulse Rate:  [90-125] 93 (09/29 1200) Resp:   [16-30] 23 (09/29 1200) BP: (92-132)/(45-81) 114/52 (09/29 1200) SpO2:  [91 %-98 %] 92 % (09/29 1200) Weight:  [120 lb (54.4 kg)] 120 lb (54.4 kg) (09/28 1626)  General:  Awake. Alert. No acute distress. Husband at bedside. Integument:  Warm & dry. No rash or bruising on exposed skin.  Extremities:  No cyanosis or clubbing.  Lymphatics:  No appreciated cervical or supraclavicular lymphadenoapthy. HEENT:  Moist mucus membranes. No oral ulcers. No scleral injection or icterus.  Cardiovascular:  Regular rate. No edema. No JVD appreciated.  Pulmonary:  Somewhat diminished breath sounds on the right with bronchial breath sounds. Symmetric chest wall expansion. No accessory muscle use on room air. Abdomen: Soft. Normal bowel sounds. Nondistended. Nontender. Musculoskeletal:  Normal bulk and tone. Hand grip strength 5/5 bilaterally. No joint deformity or effusion appreciated. Neurological:  Cranial nerves 2-12  grossly in tact. No meningismus. Moving all 4 extremities equally. Symmetric brachioradialis deep tendon reflexes. Psychiatric:  Mood and affect congruent. Speech normal rhythm, rate & tone.   CBC Latest Ref Rng & Units 02/22/2017 08/15/2011  WBC 4.0 - 10.5 K/uL 12.6(H) -  Hemoglobin 12.0 - 15.0 g/dL 14.2 15.7(H)  Hematocrit 36.0 - 46.0 % 41.3 -  Platelets 150 - 400 K/uL 429(H) -   BMP Latest Ref Rng & Units 02/22/2017 08/13/2011  Glucose 65 - 99 mg/dL 161(H) 159(H)  BUN 6 - 20 mg/dL 10 11  Creatinine 0.44 - 1.00 mg/dL 0.58 0.43(L)  Sodium 135 - 145 mmol/L 135 136  Potassium 3.5 - 5.1 mmol/L 4.2 4.0  Chloride 101 - 111 mmol/L 94(L) 98  CO2 22 - 32 mmol/L 26 29  Calcium 8.9 - 10.3 mg/dL 9.0 9.6    IMAGING/STUDIES: CTA CHEST 9/29:  Personally reviewed by me. Small pericardial effusion. Mild apical predominant emphysematous changes. Approximately 2.6 cm right adrenal nodule appreciated. No obvious skeletal metastases. Right upper lobe central mass noted with evidence of what appears to be  lymphangitic spread. Right suprahilar soft tissue density suspicious for mass versus pathologically enlarged lymph nodes causing either extrinsic compression of airway or endobronchial invasion. No obvious pulmonary arterial invasion. Unable to appreciate pathologic mediastinal or right hilar adenopathy with possible mass invasion. No pleural effusion or thickening appreciated. Linear opacity left lung base suggestive of atelectasis. Necrotic mass present within the lingula measuring up to 2.1 cm. Additional left upper lobe nodule measuring approximately 1 cm identified. TTE 9/29 >>>  MICROBIOLOGY: Blood Cultures x2 9/29 >>> Urine Culture 9/29 >>> Sputum Culture >>> Urine Streptococcal Antigen >>> Urine Legionella Antigen >>>  ANTIBIOTICS: Vancomycin 9/29 >>> Aztreonam 9/29 >>>  ASSESSMENT/PLAN:  71 y.o. female with long-standing history of tobacco use presenting with mild hemoptysis and chest CT findings concerning for malignancy as well as postobstructive pneumonia. Ideally I would like to perform bronchoscopy with endobronchial ultrasound-guided sampling of any mediastinal lymph nodes; however, in the setting of acute infection this would skew my biopsy results and potentially raise the risk of causing mediastinal infection as well. As such, I would favor treating the patient empirically while awaiting for sputum culture and cytology results. If these are inconclusive for unable to be obtained then I would proceed with bronchoscopy with airway inspection to obtain hopefully brushings as well as direct cultures to guide antibiotic therapy given her sensitivity to penicillin. I discussed the results with the patient as well as with her husband who was present.  1. Lung mass:  Suspect malignancy. Extensive spread unclear. Checking sputum cytology. 2. Hemoptysis: Likely secondary to lung malignancy versus infectious process. Checking sputum cytology. 3. Postobstructive pneumonia: Agree with  continuing broad-spectrum antibiotics with vancomycin and aztreonam. Awaiting sputum culture result. Awaiting additional infectious workup as noted above. Starting Mucinex BID. 4. Tobacco use disorder: Patient counseled on the importance of complete tobacco cessation. 5. COPD/asthma: Agree with continuing patient on DuoNeb 3 times a day to help with mucus expectoration. 6. Pericardial effusion: Awaiting complete echocardiogram result.  Remainder of care as per primary service and other consultants.  Sonia Baller Ashok Cordia, M.D. [facility] Pager:  [phone] After 3pm or if no response, call [phone] 3:25 PM 02/23/17

## 2017-02-23 NOTE — ED Notes (Signed)
Please call Sheyla-RN [phone] for report at [phone]

## 2017-02-23 NOTE — Progress Notes (Signed)
[place] OF CARE NOTE Patient: [name] XYD:[id number]   PCP: [name], MD DOB: 06-Mar-1946   DOA: 02/22/2017   DOS: 02/23/2017    Patient was admitted by my colleague Dr. Olevia Bowens earlier on 02/23/2017. I have reviewed the H&P as well as assessment and plan and agree with the same. Important changes in the plan are listed below.  Plan of care: Principal Problem:   Postobstructive pneumonia Active Problems:   Tobacco use   COPD (chronic obstructive pulmonary disease) (HCC)   Hyperlipemia   Hypertension   Depression   Type 2 diabetes mellitus ([place])   Lung mass Consulted pulmonary for lung mass. Recommend currently sputum cytology only. We'll monitor on antibiotics.  Author: Berle Mull, MD [facility] Pager: [phone] 02/23/2017 5:11 PM   If 7PM-7AM, please contact night-coverage at [email or web address], password [email or web address]

## 2017-02-23 NOTE — ED Notes (Signed)
Pt. Ambulated down the hall and back to her room without difficulty on 92% room air. Pt. Gait steady on her feet.

## 2017-02-23 NOTE — Progress Notes (Signed)
Pharmacy Antibiotic Note  [name] is a 71 y.o. female admitted on 02/22/2017 with pneumonia.  Pharmacy has been consulted for vancomycin, aztreonam dosing.  Plan: Vancomycin 500mg  IV every 12 hours.  Goal trough 15-20 mcg/mL.  Aztreonam 1gm iv q8hr  Height: 5' (152.4 cm) Weight: 120 lb (54.4 kg) IBW/kg (Calculated) : 45.5  Temp (24hrs), Avg:98.6 F (37 C), Min:97.7 F (36.5 C), Max:99.8 F (37.7 C)   Recent Labs Lab 02/22/17 2249 02/22/17 2304  WBC 12.6*  --   CREATININE 0.58  --   LATICACIDVEN  --  1.11    Estimated Creatinine Clearance: 47.7 mL/min (by C-G formula based on SCr of 0.58 mg/dL).    Allergies  Allergen Reactions  . Penicillins Hives    Has patient had a PCN reaction causing immediate rash, facial/tongue/throat swelling, SOB or lightheadedness with hypotension: no Has patient had a PCN reaction causing severe rash involving mucus membranes or skin necrosis: no Has patient had a PCN reaction that required hospitalization: yes Has patient had a PCN reaction occurring within the last 10 years: no If all of the above answers are "NO", then may proceed with Cephalosporin use.   . Tiotropium Bromide Monohydrate Other (See Comments)    EYE PAIN KIDNEY FUNCTION SLOWED DOWN    Antimicrobials this admission: Vancomycin 02/23/2017 >> Aztreonam 02/23/2017 >>   Dose adjustments this admission: -  Microbiology results: pending  Thank you for allowing pharmacy to be a part of this patient's care.  [name] 02/23/2017 6:18 AM

## 2017-02-24 LAB — COMPREHENSIVE METABOLIC PANEL
ALBUMIN: 2.7 g/dL — AB (ref 3.5–5.0)
ALK PHOS: 48 U/L (ref 38–126)
ALT: 9 U/L — AB (ref 14–54)
AST: 6 U/L — AB (ref 15–41)
Anion gap: 9 (ref 5–15)
BUN: 8 mg/dL (ref 6–20)
CALCIUM: 8.4 mg/dL — AB (ref 8.9–10.3)
CO2: 25 mmol/L (ref 22–32)
CREATININE: 0.43 mg/dL — AB (ref 0.44–1.00)
Chloride: 101 mmol/L (ref 101–111)
GFR calc non Af Amer: >60 mL/min (ref >60)
GLUCOSE: 171 mg/dL — AB (ref 65–99)
Potassium: 4.4 mmol/L (ref 3.5–5.1)
SODIUM: 135 mmol/L (ref 135–145)
Total Bilirubin: 0.4 mg/dL (ref 0.3–1.2)
Total Protein: 6 g/dL — ABNORMAL LOW (ref 6.5–8.1)

## 2017-02-24 LAB — URINE CULTURE

## 2017-02-24 LAB — CBC WITH DIFFERENTIAL/PLATELET
BASOS ABS: 0 10*3/uL (ref 0.0–0.1)
BASOS PCT: 0 %
EOS ABS: 0.2 10*3/uL (ref 0.0–0.7)
Eosinophils Relative: 3 %
HEMATOCRIT: 36.9 % (ref 36.0–46.0)
Hemoglobin: 12.1 g/dL (ref 12.0–15.0)
LYMPHS ABS: 1.4 10*3/uL (ref 0.7–4.0)
Lymphocytes Relative: 19 %
MCH: 31.8 pg (ref 26.0–34.0)
MCHC: 32.8 g/dL (ref 30.0–36.0)
MCV: 97.1 fL (ref 78.0–100.0)
MONO ABS: 0.8 10*3/uL (ref 0.1–1.0)
MONOS PCT: 11 %
NEUTROS ABS: 4.9 10*3/uL (ref 1.7–7.7)
Neutrophils Relative %: 67 %
Platelets: 322 10*3/uL (ref 150–400)
RBC: 3.8 MIL/uL — ABNORMAL LOW (ref 3.87–5.11)
RDW: 12.4 % (ref 11.5–15.5)
WBC: 7.3 10*3/uL (ref 4.0–10.5)

## 2017-02-24 LAB — GLUCOSE, CAPILLARY
GLUCOSE-CAPILLARY: 254 mg/dL — AB (ref 65–99)
Glucose-Capillary: 156 mg/dL — ABNORMAL HIGH (ref 65–99)
Glucose-Capillary: 197 mg/dL — ABNORMAL HIGH (ref 65–99)
Glucose-Capillary: 90 mg/dL (ref 65–99)

## 2017-02-24 LAB — HEMOGLOBIN A1C
Hgb A1c MFr Bld: 7.5 % — ABNORMAL HIGH (ref 4.8–5.6)
Mean Plasma Glucose: 168.55 mg/dL

## 2017-02-24 LAB — PROTIME-INR
INR: 1.13
Prothrombin Time: 14.4 seconds (ref 11.4–15.2)

## 2017-02-24 LAB — LEGIONELLA PNEUMOPHILA SEROGP 1 UR AG: L. PNEUMOPHILA SEROGP 1 UR AG: NEGATIVE

## 2017-02-24 LAB — MAGNESIUM: Magnesium: 1.7 mg/dL (ref 1.7–2.4)

## 2017-02-24 MED ORDER — IPRATROPIUM-ALBUTEROL 0.5-2.5 (3) MG/3ML IN SOLN
3.0000 mL | Freq: Two times a day (BID) | RESPIRATORY_TRACT | Status: DC
  Administered 2017-02-24 – 2017-02-26 (×4): 3 mL via RESPIRATORY_TRACT
  Filled 2017-02-24 (×4): qty 3

## 2017-02-24 MED ORDER — ASPIRIN EC 81 MG PO TBEC
81.0000 mg | DELAYED_RELEASE_TABLET | Freq: Every day | ORAL | Status: DC
  Administered 2017-02-24 – 2017-02-26 (×3): 81 mg via ORAL
  Filled 2017-02-24 (×3): qty 1

## 2017-02-24 NOTE — Progress Notes (Signed)
[facility] Consult  Physician Requesting Consult:  Berle Mull, M.D. / [facility]  Date of Consult:  02/23/2017  Reason for Consult/Chief Complaint:  Lung Mass w/ Hemoptysis  History of Presenting Illness:  71 y.o. female with known history of COPD as well as ongoing tobacco use disorder. Patient was treated as an outpatient with Levaquin for progressively worsening dyspnea and productive cough with hemoptysis. Patient endorsed a 4 pound weight loss over the last month on presentation. She also endorsed increased fatigue and decreased appetite. In the emergency department the patient was mildly tachycardic and afebrile saturating normally on room air. Workup performed included a CT angiogram of the chest revealing what appears to be a postobstructive pneumonia and lung mass suspicious for malignancy. This prompted pulmonary consultation.  The patient reports that she was seen by her physician in June and at that time was gaining weight. This prompted scheduling a follow-up appointment approximately 3 months afterwards. She reports that at the end of August and approximately one week before her follow-up appointment on 9/7 she began to experience anterior chest discomfort especially with bending over. Patient also endorsed dyspnea with exertion and with bending over. The patient's chest discomfort has since resolved but her dyspnea remains and currently is at baseline. She was evaluated by her primary care physician on 9/7 and with a leukocytosis as well as abnormal x-ray treated with 5 days of Levaquin. With continued leukocytosis and her regimen was subsequently transitioned to 10 days of doxycycline when she began to note improvement in her symptoms particularly her cough. She reports that she has lost approximately 4 pounds but admittedly has been trying to. She endorses chronic sweats but has also experienced chills lately. Patient reports her cough is at baseline and is productive of a  thick sputum in the morning with a blood tinge. The remainder of the day her cough produces a green and yellow mucus that is thick as well. She denies any perceived lymphadenopathy in her neck, groin, or axilla. He does endorse some mild throat pain. She denies any bone pain. She denies any dysphagia or odynophagia although she reportedly did aspirate on some food approximately 2 months ago. She denies any headache or acute vision changes. She denies any focal weakness, numbness, or tingling. She denies any abdominal pain or nausea. She denies any melena or hematochezia.  Subjective:  No chest pain or pressure. Minimal to no cough now. No dyspnea at rest.  Review of Systems:  No subjective fever or chills. No abdominal pain or nausea.  Temp:  [97.7 F (36.5 C)-98 F (36.7 C)] 97.7 F (36.5 C) (09/29 2036) Pulse Rate:  [90-105] 104 (09/29 2036) Resp:  [18-30] 20 (09/29 2036) BP: (92-137)/(43-77) 124/54 (09/29 2036) SpO2:  [91 %-98 %] 93 % (09/29 2119) Weight:  [55.5 kg (122 lb 5.7 oz)] 55.5 kg (122 lb 5.7 oz) (09/29 1807)  General:  Awake. Alert. No acute distress. Watching TV. No family present at bedside.  Integument:  Warm & dry. No rash on exposed skin. No bruising on exposed skin. Extremities:  No cyanosis or clubbing.  HEENT:  No nasal turbinate swelling. Moist pedis members. No scleral icterus. Cardiovascular:  Regular rate. No edema. No appreciable JVD.  Pulmonary:  Slightly diminished breath sounds on the right persists. Otherwise normal work of breathing on room air. Abdomen: Soft. Normal bowel sounds. Nondistended.  Neurological: No meningismus. Radial nerves grossly intact. Alert and oriented 4.  CBC Latest Ref Rng & Units 02/22/2017  08/15/2011  WBC 4.0 - 10.5 K/uL 12.6(H) -  Hemoglobin 12.0 - 15.0 g/dL 14.2 15.7(H)  Hematocrit 36.0 - 46.0 % 41.3 -  Platelets 150 - 400 K/uL 429(H) -   BMP Latest Ref Rng & Units 02/22/2017 08/13/2011  Glucose 65 - 99 mg/dL 161(H) 159(H)  BUN 6  - 20 mg/dL 10 11  Creatinine 0.44 - 1.00 mg/dL 0.58 0.43(L)  Sodium 135 - 145 mmol/L 135 136  Potassium 3.5 - 5.1 mmol/L 4.2 4.0  Chloride 101 - 111 mmol/L 94(L) 98  CO2 22 - 32 mmol/L 26 29  Calcium 8.9 - 10.3 mg/dL 9.0 9.6    IMAGING/STUDIES: CTA CHEST 9/29:  Previously reviewed by me. Small pericardial effusion. Mild apical predominant emphysematous changes. Approximately 2.6 cm right adrenal nodule appreciated. No obvious skeletal metastases. Right upper lobe central mass noted with evidence of what appears to be lymphangitic spread. Right suprahilar soft tissue density suspicious for mass versus pathologically enlarged lymph nodes causing either extrinsic compression of airway or endobronchial invasion. No obvious pulmonary arterial invasion. Unable to appreciate pathologic mediastinal or right hilar adenopathy with possible mass invasion. No pleural effusion or thickening appreciated. Linear opacity left lung base suggestive of atelectasis. Necrotic mass present within the lingula measuring up to 2.1 cm. Additional left upper lobe nodule measuring approximately 1 cm identified. TTE 9/29:  LV normal in size with EF 65-70%. No regional wall motion abnormalities & grade 1 diastolic dysfunction. LA & RA normal in size. RV normal in size and function. Pulmonary artery systolic pressure 32 mmHg. No aortic stenosis or regurgitation. No mitral stenosis or regurgitation. No significant pulmonic regurgitation with poorly visualized valve. No tricuspid regurgitation. No pericardial effusion.  MICROBIOLOGY: Blood Cultures x2 9/29 >>> Urine Culture 9/28 >>> Sputum Culture >>> Urine Streptococcal Antigen 9/29:  Negative  Urine Legionella Antigen 9/29 >>>  ANTIBIOTICS: Vancomycin 9/29 >>> Aztreonam 9/29 >>>  ASSESSMENT/PLAN:  71 y.o. female with long-standing history of tobacco use. Presenting with hemoptysis. Has evidence of what appears to be postobstructive pneumonia and likely lung mass with at  least lymphangitic spread. Symptomatically the patient is improving; however, with her inability to produce sputum we cannot obtain cytology or further cultures to guide antibiotics. I did discuss bronchoscopy with the patient again as well as the risks of the procedure. She is willing to undergo bronchoscopy as soon as we can arrange it.  1. Postobstructive pneumonia: Patient continuing on treatment with vancomycin and aztreonam. Continue Mucinex twice a day. Plan for bronchoscopy with lavage as soon as I can arrange this. Patient will be nothing by mouth after midnight. 2. Lung mass: Plan for bronchoscopy with lavage and airway inspection as soon as possible, hopefully tomorrow. Ultimately will need staging. 3. Hemoptysis: Resolving. 4. COPD/asthma: Patient continuing on Mucinex twice a day & Duonebs 3 times a day. 5. Tobacco use disorder: Tobacco cessation education prior to discharge.  I have spent a total of 37 minutes of time today caring for the patient, reviewing the patient's electronic medical record, and with more than 50% of that time spent coordinating care with the patient as well as reviewing the continuing plan of care with the patient and her nurse at bedside.  Remainder of care as per primary service and other consultants.  Sonia Baller Ashok Cordia, M.D. [facility] Pager:  [phone] After 3pm or if no response, call [phone] 4:06 AM 02/24/17

## 2017-02-24 NOTE — Progress Notes (Signed)
[facility] Progress Note  Patient: [name] IRS:[id number]   PCP: [name], MD DOB: November 26, 1945   DOA: 02/22/2017   DOS: 02/24/2017   Date of Service: the patient was seen and examined on 02/24/2017  Subjective: Feeling better, hemoptysis is also getting better. Shortness of breath is almost resolved. No chest pain or abdominal pain no nausea no vomiting.  Brief hospital course: Pt. with PMH of osteoarthritis, asthma, COPD, depression, type II DM, HLD, HTN; admitted on 02/22/2017, presented with complaint of hemoptysis, was found to have pneumonia and lung mass. Currently further plan is continue IV antibiotics and scheduled for bronchoscopy.  Assessment and Plan:  Postobstructive pneumonia Admit to telemetry/inpatient. Continue supplemental oxygen. Gentle IV hydration. Continue scheduled and as needed bronchodilators. Blood cultures 2. Aztreonam and vancomycin per pharmacy. Check sputum Gram stain, culture and sensitivity. Negative strep pneumoniae urinary antigen.  Active Problems:   Lung mass Appreciate consult from pulmonary, pt will be scheduled for bronchoscopy this week. Will monitor result.    Tobacco use Declined nicotine replacement therapy.    COPD (chronic obstructive pulmonary disease) (HCC) Continue supplemental oxygen. Bronchodilators as needed.    Hyperlipemia Continue simvastatin 40 mg by mouth daily for pharmacy formulary equivalent. Follow-up LFTs as needed.    Hypertension Continue losartan 50 mg by mouth daily. Monitor for low pressure, renal function and electrolytes.    Depression Continue Celexa 20 mg by mouth daily.    Type 2 diabetes mellitus (HCC) Carbohydrate modified diet. CBG monitoring. Start regular insulin sliding scale if needed.  Diet:  DVT Prophylaxis: subcutaneous Heparin  Advance goals of care discussion: full code  Family Communication: family was present at bedside, at the time of interview. The pt  provided permission to discuss medical plan with the family. Opportunity was given to ask question and all questions were answered satisfactorily.   Disposition:  Discharge to home.  Consultants: PCCM Procedures: none  Antibiotics: Anti-infectives    Start     Dose/Rate Route Frequency Ordered Stop   02/23/17 2200  vancomycin (VANCOCIN) 500 mg in sodium chloride 0.9 % 100 mL IVPB     500 mg 100 mL/hr over 60 Minutes Intravenous Every 12 hours 02/23/17 0617     02/23/17 1800  aztreonam (AZACTAM) 1 GM IVPB     1 g 100 mL/hr over 30 Minutes Intravenous Every 8 hours 02/23/17 1103     02/23/17 0630  aztreonam (AZACTAM) 2 g in dextrose 5 % 50 mL IVPB  Status:  Discontinued     2 g 100 mL/hr over 30 Minutes Intravenous 2 times daily 02/23/17 0614 02/23/17 1103   02/23/17 0630  vancomycin (VANCOCIN) IVPB 1000 mg/200 mL premix     1,000 mg 200 mL/hr over 60 Minutes Intravenous  Once 02/23/17 0617 02/23/17 0840       Objective: Physical Exam: Vitals:   02/23/17 2036 02/23/17 2119 02/24/17 0443 02/24/17 1418  BP: (!) 124/54  117/84 (!) 101/45  Pulse: (!) 104  (!) 109 98  Resp: 20  20 17   Temp: 97.7 F (36.5 C)  97.6 F (36.4 C) 97.9 F (36.6 C)  TempSrc: Oral  Oral Oral  SpO2: 91% 93% 92% 91%  Weight:      Height:        Intake/Output Summary (Last 24 hours) at 02/24/17 1610 Last data filed at 02/24/17 1300  Gross per 24 hour  Intake              440 ml  Output                0 ml  Net              440 ml   Filed Weights   02/22/17 1626 02/23/17 1807  Weight: 54.4 kg (120 lb) 55.5 kg (122 lb 5.7 oz)   General: Alert, Awake and Oriented to Time, Place and Person. Appear in mild distress, affect appropriate Eyes: PERRL, Conjunctiva normal ENT: Oral Mucosa clear moist. Neck: no JVD, no Abnormal Mass Or lumps Cardiovascular: S1 and S2 Present, no Murmur, Peripheral Pulses Present Respiratory: normal respiratory effort, Bilateral Air entry equal and Decreased, no use of  accessory muscle, bilateral Crackles, no wheezes Abdomen: Bowel Sound present, Soft and no tenderness, no hernia Skin: no redness, on Rash, on induration Extremities: no Pedal edema, no calf tenderness Neurologic: Grossly no focal neuro deficit. Bilaterally Equal motor strength  Data Reviewed: CBC:  Recent Labs Lab 02/22/17 2249 02/24/17 0559  WBC 12.6* 7.3  NEUTROABS 8.0* 4.9  HGB 14.2 12.1  HCT 41.3 36.9  MCV 95.8 97.1  PLT 429* 242   Basic Metabolic Panel:  Recent Labs Lab 02/22/17 2249 02/24/17 0559  NA 135 135  K 4.2 4.4  CL 94* 101  CO2 26 25  GLUCOSE 161* 171*  BUN 10 8  CREATININE 0.58 0.43*  CALCIUM 9.0 8.4*  MG  --  1.7    Liver Function Tests:  Recent Labs Lab 02/24/17 0559  AST 6*  ALT 9*  ALKPHOS 48  BILITOT 0.4  PROT 6.0*  ALBUMIN 2.7*   No results for input(s): LIPASE, AMYLASE in the last 168 hours. No results for input(s): AMMONIA in the last 168 hours. Coagulation Profile:  Recent Labs Lab 02/24/17 0559  INR 1.13   Cardiac Enzymes:  Recent Labs Lab 02/22/17 2249  TROPONINI <0.03   BNP (last 3 results) No results for input(s): PROBNP in the last 8760 hours. CBG:  Recent Labs Lab 02/23/17 1807 02/23/17 2033 02/24/17 0739 02/24/17 1144  GLUCAP 260* 195* 156* 197*   Studies: No results found.  Scheduled Meds: . aspirin EC  81 mg Oral Daily  . citalopram  10 mg Oral Daily  . guaiFENesin  1,200 mg Oral BID  . insulin aspart  0-5 Units Subcutaneous QHS  . insulin aspart  0-9 Units Subcutaneous TID WC  . ipratropium-albuterol  3 mL Nebulization BID  . losartan  50 mg Oral Daily  . pantoprazole  40 mg Oral Daily  . polyethylene glycol  17 g Oral Daily  . simvastatin  40 mg Oral QPM   Continuous Infusions: . aztreonam Stopped (02/24/17 1327)  . vancomycin Stopped (02/24/17 1221)   PRN Meds: albuterol, ondansetron **OR** ondansetron (ZOFRAN) IV  Time spent: 35 minutes  Author: Berle Mull, MD [facility] Pager: [phone] 02/24/2017 4:10 PM  If 7PM-7AM, please contact night-coverage at [email or web address], password [facility]

## 2017-02-25 ENCOUNTER — Encounter (HOSPITAL_COMMUNITY)
Admission: EM | Disposition: A | Discharge status: Home / Self Care | Payer: Self-pay | Source: Home / Self Care | Attending: Internal Medicine

## 2017-02-25 ENCOUNTER — Inpatient Hospital Stay (HOSPITAL_COMMUNITY): Payer: Medicare Other

## 2017-02-25 ENCOUNTER — Encounter (HOSPITAL_COMMUNITY): Payer: Self-pay

## 2017-02-25 HISTORY — PX: VIDEO BRONCHOSCOPY: SHX5072

## 2017-02-25 LAB — BASIC METABOLIC PANEL
Anion gap: 7 (ref 5–15)
BUN: 7 mg/dL (ref 6–20)
CALCIUM: 8.5 mg/dL — AB (ref 8.9–10.3)
CO2: 28 mmol/L (ref 22–32)
CREATININE: 0.41 mg/dL — AB (ref 0.44–1.00)
Chloride: 102 mmol/L (ref 101–111)
Glucose, Bld: 87 mg/dL (ref 65–99)
Potassium: 4.3 mmol/L (ref 3.5–5.1)
SODIUM: 137 mmol/L (ref 135–145)

## 2017-02-25 LAB — CBC WITH DIFFERENTIAL/PLATELET
BASOS ABS: 0 10*3/uL (ref 0.0–0.1)
BASOS PCT: 0 %
EOS ABS: 0.2 10*3/uL (ref 0.0–0.7)
Eosinophils Relative: 3 %
HCT: 36.8 % (ref 36.0–46.0)
Hemoglobin: 12.5 g/dL (ref 12.0–15.0)
Lymphocytes Relative: 31 %
Lymphs Abs: 1.8 10*3/uL (ref 0.7–4.0)
MCH: 32.4 pg (ref 26.0–34.0)
MCHC: 34 g/dL (ref 30.0–36.0)
MCV: 95.3 fL (ref 78.0–100.0)
MONO ABS: 0.7 10*3/uL (ref 0.1–1.0)
MONOS PCT: 11 %
NEUTROS PCT: 55 %
Neutro Abs: 3.2 10*3/uL (ref 1.7–7.7)
PLATELETS: 310 10*3/uL (ref 150–400)
RBC: 3.86 MIL/uL — ABNORMAL LOW (ref 3.87–5.11)
RDW: 12.2 % (ref 11.5–15.5)
WBC: 5.8 10*3/uL (ref 4.0–10.5)

## 2017-02-25 LAB — MAGNESIUM: MAGNESIUM: 1.9 mg/dL (ref 1.7–2.4)

## 2017-02-25 LAB — GLUCOSE, CAPILLARY
GLUCOSE-CAPILLARY: 106 mg/dL — AB (ref 65–99)
GLUCOSE-CAPILLARY: 219 mg/dL — AB (ref 65–99)
GLUCOSE-CAPILLARY: 237 mg/dL — AB (ref 65–99)
Glucose-Capillary: 123 mg/dL — ABNORMAL HIGH (ref 65–99)

## 2017-02-25 SURGERY — VIDEO BRONCHOSCOPY WITHOUT FLUORO
Anesthesia: Moderate Sedation | Laterality: Bilateral

## 2017-02-25 MED ORDER — LIDOCAINE HCL 1 % IJ SOLN
INTRAMUSCULAR | Status: DC | PRN
  Administered 2017-02-25: 10 mL

## 2017-02-25 MED ORDER — MIDAZOLAM HCL 5 MG/ML IJ SOLN
INTRAMUSCULAR | Status: AC
  Filled 2017-02-25: qty 2

## 2017-02-25 MED ORDER — FENTANYL CITRATE (PF) 100 MCG/2ML IJ SOLN
INTRAMUSCULAR | Status: AC
  Filled 2017-02-25: qty 4

## 2017-02-25 MED ORDER — MIDAZOLAM HCL 10 MG/2ML IJ SOLN
INTRAMUSCULAR | Status: DC | PRN
  Administered 2017-02-25: 2 mg via INTRAVENOUS

## 2017-02-25 MED ORDER — DOXYCYCLINE HYCLATE 100 MG IV SOLR
100.0000 mg | Freq: Two times a day (BID) | INTRAVENOUS | Status: DC
  Administered 2017-02-25 – 2017-02-26 (×3): 100 mg via INTRAVENOUS
  Filled 2017-02-25 (×3): qty 100

## 2017-02-25 MED ORDER — SODIUM CHLORIDE 0.9 % IV BOLUS (SEPSIS)
250.0000 mL | Freq: Once | INTRAVENOUS | Status: AC
  Administered 2017-02-25: 250 mL via INTRAVENOUS

## 2017-02-25 MED ORDER — SODIUM CHLORIDE 0.9 % IV SOLN
Freq: Once | INTRAVENOUS | Status: AC
  Administered 2017-02-25: 13:00:00 via INTRAVENOUS

## 2017-02-25 MED ORDER — FENTANYL CITRATE (PF) 100 MCG/2ML IJ SOLN
INTRAMUSCULAR | Status: DC | PRN
  Administered 2017-02-25 (×3): 25 ug via INTRAVENOUS

## 2017-02-25 MED ORDER — BUTAMBEN-TETRACAINE-BENZOCAINE 2-2-14 % EX AERO
INHALATION_SPRAY | CUTANEOUS | Status: DC | PRN
  Administered 2017-02-25: 1 via TOPICAL

## 2017-02-25 NOTE — Consult Note (Signed)
Reason for Consult: left vocal cord lesion Referring Physician: Dr. Janit Pagan is an 71 y.o. female.  HPI: I am seeing her in consultation for left vocal cord lesion. This was noted today during a bronchoscopy for recent hemoptysis and lung mass. She is currently admitted for PNA and COPD exacerbation. She reports voice changes for 16 years, since undergoing ACDF on the left side. No recent voice changes. She denies aspiration, dysphagia, otalgia, cervical LN enlargement. She endorses occasional throat pain and dysphonia. No vocal breaks, though she does struggle with projection. She has been a smoker for many years and is still smoking. Recent hemoptysis.   Past Medical History:  Diagnosis Date  . Arthritis   . Asthma   . COPD (chronic obstructive pulmonary disease) ([place])   . Depression   . Diabetes mellitus    diet controlled  . Hyperlipemia   . Hypertension     Past Surgical History:  Procedure Laterality Date  . CARDIAC CATHETERIZATION  1999  . CARPAL TUNNEL RELEASE  2/13   right-GSC  . CARPAL TUNNEL RELEASE  08/15/2011   Procedure: CARPAL TUNNEL RELEASE;  Surgeon: Linna Hoff, MD;  Location: [place];  Service: Orthopedics;  Laterality: Left;  . CATARACT EXTRACTION    . CERVICAL FUSION  2002  . COLONOSCOPY    . TONSILLECTOMY      Family History  Problem Relation Age of Onset  . Diabetes Mother   . Colon cancer Father   . Cerebral palsy Brother   . Breast cancer Paternal Aunt   . Breast cancer Cousin   . Prostate cancer Paternal Uncle   . Bone cancer Maternal Aunt     Social History:  reports that she has been smoking.  She started smoking about 54 years ago. She has a 53.00 pack-year smoking history. She has never used smokeless tobacco. She reports that she does not drink alcohol or use drugs.  Allergies:  Allergies  Allergen Reactions  . Penicillins Hives    Has patient had a PCN reaction causing immediate rash, facial/tongue/throat  swelling, SOB or lightheadedness with hypotension: no Has patient had a PCN reaction causing severe rash involving mucus membranes or skin necrosis: no Has patient had a PCN reaction that required hospitalization: yes Has patient had a PCN reaction occurring within the last 10 years: no If all of the above answers are "NO", then may proceed with Cephalosporin use.   . Tiotropium Bromide Monohydrate Other (See Comments)    EYE PAIN KIDNEY FUNCTION SLOWED DOWN    Medications: I have reviewed the patient's current medications.  Results for orders placed or performed during the hospital encounter of 02/22/17 (from the past 48 hour(s))  Glucose, capillary     Status: Abnormal   Collection Time: 02/23/17  6:07 PM  Result Value Ref Range   Glucose-Capillary 260 (H) 65 - 99 mg/dL  Glucose, capillary     Status: Abnormal   Collection Time: 02/23/17  8:33 PM  Result Value Ref Range   Glucose-Capillary 195 (H) 65 - 99 mg/dL  CBC WITH DIFFERENTIAL     Status: Abnormal   Collection Time: 02/24/17  5:59 AM  Result Value Ref Range   WBC 7.3 4.0 - 10.5 K/uL   RBC 3.80 (L) 3.87 - 5.11 MIL/uL   Hemoglobin 12.1 12.0 - 15.0 g/dL   HCT 36.9 36.0 - 46.0 %   MCV 97.1 78.0 - 100.0 fL   MCH 31.8 26.0 - 34.0  pg   MCHC 32.8 30.0 - 36.0 g/dL   RDW 12.4 11.5 - 15.5 %   Platelets 322 150 - 400 K/uL   Neutrophils Relative % 67 %   Neutro Abs 4.9 1.7 - 7.7 K/uL   Lymphocytes Relative 19 %   Lymphs Abs 1.4 0.7 - 4.0 K/uL   Monocytes Relative 11 %   Monocytes Absolute 0.8 0.1 - 1.0 K/uL   Eosinophils Relative 3 %   Eosinophils Absolute 0.2 0.0 - 0.7 K/uL   Basophils Relative 0 %   Basophils Absolute 0.0 0.0 - 0.1 K/uL  Hemoglobin A1c     Status: Abnormal   Collection Time: 02/24/17  5:59 AM  Result Value Ref Range   Hgb A1c MFr Bld 7.5 (H) 4.8 - 5.6 %    Comment: (NOTE) Pre diabetes:          5.7%-6.4% Diabetes:              >6.4% Glycemic control for   <7.0% adults with diabetes    Mean Plasma  Glucose 168.55 mg/dL    Comment: Performed at [facility], [address], [place], [place] [address]  Comprehensive metabolic panel     Status: Abnormal   Collection Time: 02/24/17  5:59 AM  Result Value Ref Range   Sodium 135 135 - 145 mmol/L   Potassium 4.4 3.5 - 5.1 mmol/L   Chloride 101 101 - 111 mmol/L   CO2 25 22 - 32 mmol/L   Glucose, Bld 171 (H) 65 - 99 mg/dL   BUN 8 6 - 20 mg/dL   Creatinine, Ser 0.43 (L) 0.44 - 1.00 mg/dL   Calcium 8.4 (L) 8.9 - 10.3 mg/dL   Total Protein 6.0 (L) 6.5 - 8.1 g/dL   Albumin 2.7 (L) 3.5 - 5.0 g/dL   AST 6 (L) 15 - 41 U/L   ALT 9 (L) 14 - 54 U/L   Alkaline Phosphatase 48 38 - 126 U/L   Total Bilirubin 0.4 0.3 - 1.2 mg/dL   GFR calc non Af Amer >60 >60 mL/min   GFR calc Af Amer >60 >60 mL/min    Comment: (NOTE) The eGFR has been calculated using the CKD EPI equation. This calculation has not been validated in all clinical situations. eGFR's persistently <60 mL/min signify possible Chronic Kidney Disease.    Anion gap 9 5 - 15  Magnesium     Status: None   Collection Time: 02/24/17  5:59 AM  Result Value Ref Range   Magnesium 1.7 1.7 - 2.4 mg/dL  Protime-INR     Status: None   Collection Time: 02/24/17  5:59 AM  Result Value Ref Range   Prothrombin Time 14.4 11.4 - 15.2 seconds   INR 1.13   Glucose, capillary     Status: Abnormal   Collection Time: 02/24/17  7:39 AM  Result Value Ref Range   Glucose-Capillary 156 (H) 65 - 99 mg/dL  Glucose, capillary     Status: Abnormal   Collection Time: 02/24/17 11:44 AM  Result Value Ref Range   Glucose-Capillary 197 (H) 65 - 99 mg/dL  Glucose, capillary     Status: None   Collection Time: 02/24/17  4:41 PM  Result Value Ref Range   Glucose-Capillary 90 65 - 99 mg/dL  Glucose, capillary     Status: Abnormal   Collection Time: 02/24/17  9:12 PM  Result Value Ref Range   Glucose-Capillary 254 (H) 65 - 99 mg/dL  CBC WITH  DIFFERENTIAL     Status: Abnormal   Collection Time: 02/25/17   4:50 AM  Result Value Ref Range   WBC 5.8 4.0 - 10.5 K/uL   RBC 3.86 (L) 3.87 - 5.11 MIL/uL   Hemoglobin 12.5 12.0 - 15.0 g/dL   HCT 36.8 36.0 - 46.0 %   MCV 95.3 78.0 - 100.0 fL   MCH 32.4 26.0 - 34.0 pg   MCHC 34.0 30.0 - 36.0 g/dL   RDW 12.2 11.5 - 15.5 %   Platelets 310 150 - 400 K/uL   Neutrophils Relative % 55 %   Neutro Abs 3.2 1.7 - 7.7 K/uL   Lymphocytes Relative 31 %   Lymphs Abs 1.8 0.7 - 4.0 K/uL   Monocytes Relative 11 %   Monocytes Absolute 0.7 0.1 - 1.0 K/uL   Eosinophils Relative 3 %   Eosinophils Absolute 0.2 0.0 - 0.7 K/uL   Basophils Relative 0 %   Basophils Absolute 0.0 0.0 - 0.1 K/uL  Basic metabolic panel     Status: Abnormal   Collection Time: 02/25/17  4:50 AM  Result Value Ref Range   Sodium 137 135 - 145 mmol/L   Potassium 4.3 3.5 - 5.1 mmol/L   Chloride 102 101 - 111 mmol/L   CO2 28 22 - 32 mmol/L   Glucose, Bld 87 65 - 99 mg/dL   BUN 7 6 - 20 mg/dL   Creatinine, Ser 0.41 (L) 0.44 - 1.00 mg/dL   Calcium 8.5 (L) 8.9 - 10.3 mg/dL   GFR calc non Af Amer >60 >60 mL/min   GFR calc Af Amer >60 >60 mL/min    Comment: (NOTE) The eGFR has been calculated using the CKD EPI equation. This calculation has not been validated in all clinical situations. eGFR's persistently <60 mL/min signify possible Chronic Kidney Disease.    Anion gap 7 5 - 15  Magnesium     Status: None   Collection Time: 02/25/17  4:50 AM  Result Value Ref Range   Magnesium 1.9 1.7 - 2.4 mg/dL  Glucose, capillary     Status: Abnormal   Collection Time: 02/25/17  7:40 AM  Result Value Ref Range   Glucose-Capillary 106 (H) 65 - 99 mg/dL  Glucose, capillary     Status: Abnormal   Collection Time: 02/25/17 11:42 AM  Result Value Ref Range   Glucose-Capillary 123 (H) 65 - 99 mg/dL    No results found.  Review of Systems  Constitutional: Positive for chills and diaphoresis.  HENT: Positive for sore throat. Negative for ear pain.   Respiratory: Positive for cough, hemoptysis,  sputum production, shortness of breath and wheezing. Negative for stridor.   Musculoskeletal: Positive for neck pain.  Neurological: Negative for focal weakness.  Psychiatric/Behavioral: Negative for depression.   Blood pressure 103/65, pulse 77, temperature 97.7 F (36.5 C), temperature source Oral, resp. rate 16, height 5' (1.524 m), weight 55.3 kg (122 lb), SpO2 98 %. Physical Exam  Constitutional: She is oriented to person, place, and time. She appears well-developed. No distress.  HENT:  Head: Normocephalic and atraumatic. Not macrocephalic.  Right Ear: Hearing, external ear and ear canal normal. No drainage.  Left Ear: Hearing, external ear and ear canal normal. No drainage.  Nose: Mucosal edema and rhinorrhea present. No sinus tenderness or septal deviation. No epistaxis.  Mouth/Throat: Mucous membranes are normal. She has dentures. No oropharyngeal exudate, posterior oropharyngeal edema or tonsillar abscesses.  There is adequate neck extension.  Patient is edentulous No  cervical LAD  Eyes: Pupils are equal, round, and reactive to light. Conjunctivae are normal.  Neck: No tracheal deviation present. No thyromegaly present.  Cardiovascular: Normal rate and regular rhythm.   Respiratory: Effort normal. No stridor. No respiratory distress. She has no wheezes.  GI: She exhibits no distension.  Musculoskeletal: She exhibits no edema or tenderness.  Lymphadenopathy:    She has no cervical adenopathy.  Neurological: She is alert and oriented to person, place, and time. She has normal reflexes. No cranial nerve deficit.  Skin: Skin is warm and dry. She is not diaphoretic. There is erythema.  Psychiatric: She has a normal mood and affect. Her behavior is normal. Judgment and thought content normal.   Preop diagnosis: left vocal cord lesion Postop diagnosis: same Procedure: Transnasal fiberoptic laryngoscopy Surgeon: Blenda Nicely Anesth: Topical with xylocaine 2%  jelly lidocaine Compl:  None Findings: The superior surface of the left vocal cord demonstrates a lesion which is soft and floppy. This lesion moves and subluxes between the vocal cords and then out and superior to the vocal cords depending on the phase of breathing. There is adequate airway patency anterior and posterior to this. There are no subglottic lesions. Vocal cord motion is preserved.  Description:  After discussing risks, benefits, and alternatives, the patient was placed in a seated position and the right nasal passage was sprayed with topical anesthetic.  The fiberoptic scope was passed through the right nasal passage to view the pharynx and larynx.  Findings are noted above.  The scope was then removed and he was returned to nursing care in stable condition. .  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 We will plan to biopsy this lesion in the next couple weeks as an outpatient at [facility] once she has recovered from her PNA. We discussed the risks and benefits of this procedure including airway compromise, pain, infection, bleeding, voice changes, damage to lips/teeth/tongue, need for further surgery. She is in agreement and wishes to proceed with surgery, which we will schedule on an outpatient basis.   [place]ty Marcellino 02/25/2017, 4:20 PM

## 2017-02-25 NOTE — Progress Notes (Signed)
[facility] Progress Note  Patient: [name] WNU:[id number]   PCP: [name], MD DOB: July 18, 1945   DOA: 02/22/2017   DOS: 02/25/2017   Date of Service: the patient was seen and examined on 02/25/2017  Subjective: Still has some blood in the cough. No chest pain or abdominal pain. No nausea no vomiting. No fever no chills.   Brief hospital course: Pt. with PMH of osteoarthritis, asthma, COPD, depression, type II DM, HLD, HTN; admitted on 02/22/2017, presented with complaint of hemoptysis, was found to have pneumonia and lung mass. Currently further plan is continue IV antibiotics and scheduled for bronchoscopy.  Assessment and Plan:  Postobstructive pneumonia Admit to telemetry/inpatient. Continue supplemental oxygen. Gentle IV hydration. Continue scheduled and as needed bronchodilators. Blood cultures 2. Aztreonam and vancomycin now changed to doxycycline Will follow-up on the results of the BAL. Negative strep pneumoniae urinary antigen.  Active Problems:   Lung mass Appreciate consult from pulmonary, S/P bronchoscopy. Follow-up on the results.    Tobacco use Declined nicotine replacement therapy.    COPD (chronic obstructive pulmonary disease) (HCC) Continue supplemental oxygen. Bronchodilators as needed.    Hyperlipemia Continue simvastatin 40 mg by mouth daily for pharmacy formulary equivalent. Follow-up LFTs as needed.    Hypertension Continue losartan 50 mg by mouth daily. Monitor for low pressure, renal function and electrolytes.    Depression Continue Celexa 20 mg by mouth daily.    Type 2 diabetes mellitus (HCC) Carbohydrate modified diet. CBG monitoring. Start regular insulin sliding scale if needed.  Vocal cord polyp. Appreciate ENT assistance. Seen at the time of the bronchoscopy, appreciate pulmonary assistance as well. Patient will be getting an outpatient [place] biopsy of this lesion to rule out any malignancy there as  well.  Diet: Regular diet DVT Prophylaxis: subcutaneous Heparin  Advance goals of care discussion: full code  Family Communication: no family was present at bedside, at the time of interview.  Disposition:  Discharge to home.  Consultants: PCCM, ENT Procedures: Bronchoscopy, bedside ENT eval with transnasal fiberoptic laryngoscopy  Antibiotics: Anti-infectives    Start     Dose/Rate Route Frequency Ordered Stop   02/25/17 0800  doxycycline (VIBRAMYCIN) 100 mg in dextrose 5 % 250 mL IVPB     100 mg 125 mL/hr over 120 Minutes Intravenous Every 12 hours 02/25/17 0727     02/23/17 2200  vancomycin (VANCOCIN) 500 mg in sodium chloride 0.9 % 100 mL IVPB  Status:  Discontinued     500 mg 100 mL/hr over 60 Minutes Intravenous Every 12 hours 02/23/17 0617 02/25/17 0725   02/23/17 1800  aztreonam (AZACTAM) 1 GM IVPB  Status:  Discontinued     1 g 100 mL/hr over 30 Minutes Intravenous Every 8 hours 02/23/17 1103 02/25/17 0725   02/23/17 0630  aztreonam (AZACTAM) 2 g in dextrose 5 % 50 mL IVPB  Status:  Discontinued     2 g 100 mL/hr over 30 Minutes Intravenous 2 times daily 02/23/17 0614 02/23/17 1103   02/23/17 0630  vancomycin (VANCOCIN) IVPB 1000 mg/200 mL premix     1,000 mg 200 mL/hr over 60 Minutes Intravenous  Once 02/23/17 0617 02/23/17 0840       Objective: Physical Exam: Vitals:   02/25/17 1345 02/25/17 1405 02/25/17 1410 02/25/17 1521  BP: (!) 84/35 (!) 78/36 (!) 90/36 103/65  Pulse:    77  Resp: (!) _0 Temp:    97.7 F (36.5 C)  TempSrc:  Oral  SpO2: 92% 93%  98%  Weight:      Height:        Intake/Output Summary (Last 24 hours) at 02/25/17 1817 Last data filed at 02/25/17 1400  Gross per 24 hour  Intake              450 ml  Output                0 ml  Net              450 ml   Filed Weights   02/22/17 1626 02/23/17 1807 02/25/17 1234  Weight: 54.4 kg (120 lb) 55.5 kg (122 lb 5.7 oz) 55.3 kg (122 lb)   General: Alert, Awake and Oriented to  Time, Place and Person. Appear in mild distress, affect appropriate Eyes: PERRL, Conjunctiva normal ENT: Oral Mucosa clear moist. Neck: no JVD, no Abnormal Mass Or lumps Cardiovascular: S1 and S2 Present, no Murmur, Peripheral Pulses Present Respiratory: normal respiratory effort, Bilateral Air entry equal and Decreased, no use of accessory muscle, bilateral Crackles, no wheezes Abdomen: Bowel Sound present, Soft and no tenderness, no hernia Skin: no redness, on Rash, on induration Extremities: no Pedal edema, no calf tenderness Neurologic: Grossly no focal neuro deficit. Bilaterally Equal motor strength  Data Reviewed: CBC:  Recent Labs Lab 02/22/17 2249 02/24/17 0559 02/25/17 0450  WBC 12.6* 7.3 5.8  NEUTROABS 8.0* 4.9 3.2  HGB 14.2 12.1 12.5  HCT 41.3 36.9 36.8  MCV 95.8 97.1 95.3  PLT 429* 322 081   Basic Metabolic Panel:  Recent Labs Lab 02/22/17 2249 02/24/17 0559 02/25/17 0450  NA 135 135 137  K 4.2 4.4 4.3  CL 94* 101 102  CO2 _0 GLUCOSE 161* 171* 87  BUN _1 CREATININE 0.58 0.43* 0.41*  CALCIUM 9.0 8.4* 8.5*  MG  --  1.7 1.9    Liver Function Tests:  Recent Labs Lab 02/24/17 0559  AST 6*  ALT 9*  ALKPHOS 48  BILITOT 0.4  PROT 6.0*  ALBUMIN 2.7*   No results for input(s): LIPASE, AMYLASE in the last 168 hours. No results for input(s): AMMONIA in the last 168 hours. Coagulation Profile:  Recent Labs Lab 02/24/17 0559  INR 1.13   Cardiac Enzymes:  Recent Labs Lab 02/22/17 2249  TROPONINI <0.03   BNP (last 3 results) No results for input(s): PROBNP in the last 8760 hours. CBG:  Recent Labs Lab 02/24/17 1144 02/24/17 1641 02/24/17 2112 02/25/17 0740 02/25/17 1142  GLUCAP 197* 90 254* 106* 123*   Studies: No results found.  Scheduled Meds: . aspirin EC  81 mg Oral Daily  . citalopram  10 mg Oral Daily  . guaiFENesin  1,200 mg Oral BID  . insulin aspart  0-5 Units Subcutaneous QHS  . insulin aspart  0-9 Units  Subcutaneous TID WC  . ipratropium-albuterol  3 mL Nebulization BID  . losartan  50 mg Oral Daily  . pantoprazole  40 mg Oral Daily  . polyethylene glycol  17 g Oral Daily  . simvastatin  40 mg Oral QPM   Continuous Infusions: . doxycycline (VIBRAMYCIN) IV Stopped (02/25/17 1208)   PRN Meds: albuterol, ondansetron **OR** ondansetron (ZOFRAN) IV  Time spent: 35 minutes  Author: Berle Mull, MD [facility] Pager: [phone] 02/25/2017 6:17 PM  If 7PM-7AM, please contact night-coverage at [email or web address], password [facility]

## 2017-02-25 NOTE — ED Provider Notes (Signed)
Signout from Dr. Thurnell Garbe pending CT chest. No evidence of right upper lobe and hilar masses most consistent with metastatic disease. Patient does have some postobstructive atelectasis versus infiltrate. We'll cover with broad-spectrum antibiotics. Discussed with hospitalist will see patient in emergency department and admit.   Julianne Rice, MD 02/25/17 [phone]

## 2017-02-25 NOTE — Op Note (Addendum)
Video Bronchoscopy Procedure Note  Pre-Procedure Diagnoses: 1.  Right Upper Lobe Mass  Post-Procedure Diagnoses: 1.  Right Upper Lobe Mass 2.  Post-obstructive Pneumonia 3.  Left Vocal Chord Polyp  Procedures Performed: 1. Bronchoscopy with Airway Inspection 2. Endobronchial Brushings 3. Bronchoalveolar Lavage  Consent:  Informed consent was obtained from the patient after discussing the risks and benefits of the procedure including bleeding, infection, pneumothorax, medication allergy, vocal chord injury, and potentially death.  Conscious Sedation:   Time of First Medication Administration:  1:06 pm Time of Last Medication Administration:  1:22 pm Time Physicial Left Room:  1:35 pm  Medications Administered During Conscious Sedation: 1. Lidocaine 1% gargle 10cc 2. Lidocaine 1% 12cc via bronchoscope 3. Benzocaine Spray 20% retropharyngeal pre-procedure 4. Versed 2 mg IV 5. Fentanyl 75 mcg IV  Pre-Procedure Physical Exam: General:  No acute distress. Awake. Alert. ASA Class 2. HEENT:  Moist mucus membranes. No oral ulcers. Mallampati Class 2. Cardiovascular:  Regular rate. No edema. No appreciable JVD. Pulmonary:  Clear to auscultation with good aeration bilaterally. Normal work of breathing. Abdomen:  Soft. Nontender. Nondistended. Normal bowel sounds. Musculoskeletal:  Normal bulk and tone. Normal neck flexion & extension. Neurological:  Oriented to person, place, and time. Moving all 4 extremities equally.  Description of Procedure: Patient was brought back to the endoscopy procedure room.  A time out was performed to identify the correct patient and procedure.  Lidocaine gargle was performed.  Patient was laid recumbent and conscious sedation was administered by respiratory therapist under my direction.  Bite block was inserted and towel was placed over the patient's eyes.  Flexible bronchoscope was then inserted into the posterior pharynx until vocal chords were in full  view. There was no abnormality of the arytenoids. But the patient did have a polyp on her left vocal cord with white discoloration of the tip suspicious for keratinization.  A total of 6cc of Lidocaine was used to anesthetize the vocal chords.  The bronchoscope was then inserted between the vocal chords with ease into the proximal trachea.  Lidocaine was then used to anesthetize the patient's proximal airways.  An airway inspeciton was performed finding extrinsic compression of the right upper lobe bronchus as well as erythematous and irregular endobronchial mucosa starting just at the origin of the right upper lobe bronchus. There was 80-90% total occlusion of the bronchus. The bronchoscope was then advanced into the right upper lobe.  Endobronchial brushings were performed on the right upper lobe bronchus. A lavage was performed by instilling a total of 120cc of sterile saline and aspirating a total of 15cc of blood-tinged fluid. The flexible bronchoscope was then removed from the patient's airways after suctioning of the remaining secretions.  The bite block was removed and the patient was returned to the upright position.  Blood Loss:  None.  Complications:  None.  Bronchoalveolar Lavage: 1. Performed on the Right Upper Lobe:  Sent for cell count, Aspergillus Antigen, Fungal smear & culture, AFB smear & culture, and routine culture.  Transbronchial Biopsy: 1. Performed on the Right Upper Lobe:  Sent for quantitative culture & cytology.  Endobronchial Brushings: 1. Performed on the Right Upper Lobe Bronchus:  Sent for cytology & brush tip sent to cytology as well.  Post Procedure Instructions: 1. Patient will remain NPO for 1 hour post procedure pending passing swallow evaluation 2. Follow-up culture & cytology specimens 3. Continue broad spectrum antibiotics for now

## 2017-02-25 NOTE — Progress Notes (Signed)
Video bronchoscopy performed.  Intervention bronchial brushing. Intervention bronchial washing.  No complications noted.  Will continue to monitor.

## 2017-02-25 NOTE — Interval H&P Note (Signed)
History and Physical Interval Note:  02/25/2017 11:28 AM  [name]  has presented today for surgery, with the diagnosis of Lung mass  The various methods of treatment have been discussed with the patient and family. After consideration of risks, benefits and other options for treatment, the patient has consented to  Procedure(s): VIDEO BRONCHOSCOPY WITHOUT FLUORO (Bilateral) as a surgical intervention .  The patient's history has been reviewed, patient examined, no change in status, stable for surgery.  I have reviewed the patient's chart and labs.  Questions were answered to the patient's satisfaction.     Tera Partridge

## 2017-02-25 NOTE — Discharge Instructions (Signed)
[facility]  [phone] will call you regarding surgery. Please call this number if you have any questions regarding your surgery scheduling. Marland Kitchen

## 2017-02-25 NOTE — H&P (View-Only) (Signed)
[place] [facility] Consult  Physician Requesting Consult:  Berle Mull, M.D. / [facility]  Date of Consult:  02/23/2017  Reason for Consult/Chief Complaint:  Lung Mass w/ Hemoptysis  History of Presenting Illness:  71 y.o. female with known history of COPD as well as ongoing tobacco use disorder. Patient was treated as an outpatient with Levaquin for progressively worsening dyspnea and productive cough with hemoptysis. Patient endorsed a 4 pound weight loss over the last month on presentation. She also endorsed increased fatigue and decreased appetite. In the emergency department the patient was mildly tachycardic and afebrile saturating normally on room air. Workup performed included a CT angiogram of the chest revealing what appears to be a postobstructive pneumonia and lung mass suspicious for malignancy. This prompted pulmonary consultation.  The patient reports that she was seen by her physician in June and at that time was gaining weight. This prompted scheduling a follow-up appointment approximately 3 months afterwards. She reports that at the end of August and approximately one week before her follow-up appointment on 9/7 she began to experience anterior chest discomfort especially with bending over. Patient also endorsed dyspnea with exertion and with bending over. The patient's chest discomfort has since resolved but her dyspnea remains and currently is at baseline. She was evaluated by her primary care physician on 9/7 and with a leukocytosis as well as abnormal x-ray treated with 5 days of Levaquin. With continued leukocytosis and her regimen was subsequently transitioned to 10 days of doxycycline when she began to note improvement in her symptoms particularly her cough. She reports that she has lost approximately 4 pounds but admittedly has been trying to. She endorses chronic sweats but has also experienced chills lately. Patient reports her cough is at baseline and is productive of a  thick sputum in the morning with a blood tinge. The remainder of the day her cough produces a green and yellow mucus that is thick as well. She denies any perceived lymphadenopathy in her neck, groin, or axilla. He does endorse some mild throat pain. She denies any bone pain. She denies any dysphagia or odynophagia although she reportedly did aspirate on some food approximately 2 months ago. She denies any headache or acute vision changes. She denies any focal weakness, numbness, or tingling. She denies any abdominal pain or nausea. She denies any melena or hematochezia.  Subjective:  No chest pain or pressure. Minimal to no cough now. No dyspnea at rest.  Review of Systems:  No subjective fever or chills. No abdominal pain or nausea.  Temp:  [97.7 F (36.5 C)-98 F (36.7 C)] 97.7 F (36.5 C) (09/29 2036) Pulse Rate:  [90-105] 104 (09/29 2036) Resp:  [18-30] 20 (09/29 2036) BP: (92-137)/(43-77) 124/54 (09/29 2036) SpO2:  [91 %-98 %] 93 % (09/29 2119) Weight:  [55.5 kg (122 lb 5.7 oz)] 55.5 kg (122 lb 5.7 oz) (09/29 1807)  General:  Awake. Alert. No acute distress. Watching TV. No family present at bedside.  Integument:  Warm & dry. No rash on exposed skin. No bruising on exposed skin. Extremities:  No cyanosis or clubbing.  HEENT:  No nasal turbinate swelling. Moist pedis members. No scleral icterus. Cardiovascular:  Regular rate. No edema. No appreciable JVD.  Pulmonary:  Slightly diminished breath sounds on the right persists. Otherwise normal work of breathing on room air. Abdomen: Soft. Normal bowel sounds. Nondistended.  Neurological: No meningismus. Radial nerves grossly intact. Alert and oriented 4.  CBC Latest Ref Rng & Units 02/22/2017  08/15/2011  WBC 4.0 - 10.5 K/uL 12.6(H) -  Hemoglobin 12.0 - 15.0 g/dL 14.2 15.7(H)  Hematocrit 36.0 - 46.0 % 41.3 -  Platelets 150 - 400 K/uL 429(H) -   BMP Latest Ref Rng & Units 02/22/2017 08/13/2011  Glucose 65 - 99 mg/dL 161(H) 159(H)  BUN 6  - 20 mg/dL 10 11  Creatinine 0.44 - 1.00 mg/dL 0.58 0.43(L)  Sodium 135 - 145 mmol/L 135 136  Potassium 3.5 - 5.1 mmol/L 4.2 4.0  Chloride 101 - 111 mmol/L 94(L) 98  CO2 22 - 32 mmol/L 26 29  Calcium 8.9 - 10.3 mg/dL 9.0 9.6    IMAGING/STUDIES: CTA CHEST 9/29:  Previously reviewed by me. Small pericardial effusion. Mild apical predominant emphysematous changes. Approximately 2.6 cm right adrenal nodule appreciated. No obvious skeletal metastases. Right upper lobe central mass noted with evidence of what appears to be lymphangitic spread. Right suprahilar soft tissue density suspicious for mass versus pathologically enlarged lymph nodes causing either extrinsic compression of airway or endobronchial invasion. No obvious pulmonary arterial invasion. Unable to appreciate pathologic mediastinal or right hilar adenopathy with possible mass invasion. No pleural effusion or thickening appreciated. Linear opacity left lung base suggestive of atelectasis. Necrotic mass present within the lingula measuring up to 2.1 cm. Additional left upper lobe nodule measuring approximately 1 cm identified. TTE 9/29:  LV normal in size with EF 65-70%. No regional wall motion abnormalities & grade 1 diastolic dysfunction. LA & RA normal in size. RV normal in size and function. Pulmonary artery systolic pressure 32 mmHg. No aortic stenosis or regurgitation. No mitral stenosis or regurgitation. No significant pulmonic regurgitation with poorly visualized valve. No tricuspid regurgitation. No pericardial effusion.  MICROBIOLOGY: Blood Cultures x2 9/29 >>> Urine Culture 9/28 >>> Sputum Culture >>> Urine Streptococcal Antigen 9/29:  Negative  Urine Legionella Antigen 9/29 >>>  ANTIBIOTICS: Vancomycin 9/29 >>> Aztreonam 9/29 >>>  ASSESSMENT/PLAN:  71 y.o. female with long-standing history of tobacco use. Presenting with hemoptysis. Has evidence of what appears to be postobstructive pneumonia and likely lung mass with at  least lymphangitic spread. Symptomatically the patient is improving; however, with her inability to produce sputum we cannot obtain cytology or further cultures to guide antibiotics. I did discuss bronchoscopy with the patient again as well as the risks of the procedure. She is willing to undergo bronchoscopy as soon as we can arrange it.  1. Postobstructive pneumonia: Patient continuing on treatment with vancomycin and aztreonam. Continue Mucinex twice a day. Plan for bronchoscopy with lavage as soon as I can arrange this. Patient will be nothing by mouth after midnight. 2. Lung mass: Plan for bronchoscopy with lavage and airway inspection as soon as possible, hopefully tomorrow. Ultimately will need staging. 3. Hemoptysis: Resolving. 4. COPD/asthma: Patient continuing on Mucinex twice a day & Duonebs 3 times a day. 5. Tobacco use disorder: Tobacco cessation education prior to discharge.  I have spent a total of 37 minutes of time today caring for the patient, reviewing the patient's electronic medical record, and with more than 50% of that time spent coordinating care with the patient as well as reviewing the continuing plan of care with the patient and her nurse at bedside.  Remainder of care as per primary service and other consultants.  Sonia Baller Ashok Cordia, M.D. [facility] Pulmonary & Critical Care Pager:  [phone] After 3pm or if no response, call [phone] 4:06 AM 02/24/17

## 2017-02-26 ENCOUNTER — Encounter (HOSPITAL_COMMUNITY): Payer: Self-pay | Admitting: Pulmonary Disease

## 2017-02-26 LAB — CBC WITH DIFFERENTIAL/PLATELET
BASOS PCT: 0 %
Basophils Absolute: 0 10*3/uL (ref 0.0–0.1)
Eosinophils Absolute: 0.2 10*3/uL (ref 0.0–0.7)
Eosinophils Relative: 3 %
HEMATOCRIT: 36.2 % (ref 36.0–46.0)
HEMOGLOBIN: 11.7 g/dL — AB (ref 12.0–15.0)
LYMPHS ABS: 1.6 10*3/uL (ref 0.7–4.0)
LYMPHS PCT: 20 %
MCH: 31.6 pg (ref 26.0–34.0)
MCHC: 32.3 g/dL (ref 30.0–36.0)
MCV: 97.8 fL (ref 78.0–100.0)
MONOS PCT: 11 %
Monocytes Absolute: 0.9 10*3/uL (ref 0.1–1.0)
NEUTROS ABS: 5.5 10*3/uL (ref 1.7–7.7)
NEUTROS PCT: 66 %
Platelets: 320 10*3/uL (ref 150–400)
RBC: 3.7 MIL/uL — ABNORMAL LOW (ref 3.87–5.11)
RDW: 12.4 % (ref 11.5–15.5)
WBC: 8.3 10*3/uL (ref 4.0–10.5)

## 2017-02-26 LAB — BASIC METABOLIC PANEL
Anion gap: 7 (ref 5–15)
BUN: 7 mg/dL (ref 6–20)
CHLORIDE: 97 mmol/L — AB (ref 101–111)
CO2: 30 mmol/L (ref 22–32)
CREATININE: 0.41 mg/dL — AB (ref 0.44–1.00)
Calcium: 8.4 mg/dL — ABNORMAL LOW (ref 8.9–10.3)
Glucose, Bld: 110 mg/dL — ABNORMAL HIGH (ref 65–99)
Potassium: 4.1 mmol/L (ref 3.5–5.1)
SODIUM: 134 mmol/L — AB (ref 135–145)

## 2017-02-26 LAB — MAGNESIUM: MAGNESIUM: 1.7 mg/dL (ref 1.7–2.4)

## 2017-02-26 LAB — GLUCOSE, CAPILLARY
GLUCOSE-CAPILLARY: 135 mg/dL — AB (ref 65–99)
Glucose-Capillary: 170 mg/dL — ABNORMAL HIGH (ref 65–99)

## 2017-02-26 MED ORDER — DOXYCYCLINE HYCLATE 100 MG PO TABS: 100.0000 mg | ORAL_TABLET | Freq: Two times a day (BID) | ORAL | Status: DC

## 2017-02-26 MED ORDER — GUAIFENESIN ER 600 MG PO TB12: 1200.0000 mg | ORAL_TABLET | Freq: Two times a day (BID) | ORAL | 0 refills | Status: DC

## 2017-02-26 MED ORDER — POLYETHYLENE GLYCOL 3350 17 G PO PACK: 17.0000 g | PACK | Freq: Every day | ORAL | 0 refills | Status: AC

## 2017-02-26 MED ORDER — DOXYCYCLINE HYCLATE 100 MG PO TABS: 100.0000 mg | ORAL_TABLET | Freq: Two times a day (BID) | ORAL | 0 refills | Status: AC

## 2017-02-26 MED ORDER — IPRATROPIUM-ALBUTEROL 0.5-2.5 (3) MG/3ML IN SOLN: 3.0000 mL | Freq: Four times a day (QID) | RESPIRATORY_TRACT | 0 refills | Status: DC | PRN

## 2017-02-26 NOTE — Progress Notes (Signed)
PHARMACIST - PHYSICIAN COMMUNICATION DR:   Posey Pronto CONCERNING: Antibiotic IV to Oral Route Change Policy  RECOMMENDATION: This patient is receiving doxycycline by the intravenous route.  Based on criteria approved by the Pharmacy and Therapeutics Committee, the antibiotic(s) is/are being converted to the equivalent oral dose form(s).   DESCRIPTION: These criteria include:  Patient being treated for a respiratory tract infection, urinary tract infection, cellulitis or clostridium difficile associated diarrhea if on metronidazole  The patient is not neutropenic and does not exhibit a GI malabsorption state  The patient is eating (either orally or via tube) and/or has been taking other orally administered medications for a least 24 hours  The patient is improving clinically and has a Tmax < 100.5  If you have questions about this conversion, please contact the Pharmacy Department  []   [phone] )  [facility] []   [phone] )  [facility] []   [phone] )  [facility] []   [phone] )  [facility] [x]   [phone] )  [place], [place].D. [phone] 02/26/2017 9:52 AM

## 2017-02-26 NOTE — Progress Notes (Signed)
Assumed care of pt @ 0830.  I agree with previous RN's assessment.  Will continue with plan of care.

## 2017-02-26 NOTE — Progress Notes (Signed)
[place] [facility] Critical [facility] Consult  Physician Requesting Consult:  Berle Mull, M.D. / [facility]  Date of Consult:  02/23/2017  Reason for Consult/Chief Complaint:  Lung Mass w/ Hemoptysis  History of Presenting Illness:  71 y.o. female with known history of COPD as well as ongoing tobacco use disorder. Patient was treated as an outpatient with Levaquin for progressively worsening dyspnea and productive cough with hemoptysis. Patient endorsed a 4 pound weight loss over the last month on presentation. She also endorsed increased fatigue and decreased appetite. In the emergency department the patient was mildly tachycardic and afebrile saturating normally on room air. Workup performed included a CT angiogram of the chest revealing what appears to be a postobstructive pneumonia and lung mass suspicious for malignancy. This prompted pulmonary consultation.  The patient reports that she was seen by her physician in June and at that time was gaining weight. This prompted scheduling a follow-up appointment approximately 3 months afterwards. She reports that at the end of August and approximately one week before her follow-up appointment on 9/7 she began to experience anterior chest discomfort especially with bending over. Patient also endorsed dyspnea with exertion and with bending over. The patient's chest discomfort has since resolved but her dyspnea remains and currently is at baseline. She was evaluated by her primary care physician on 9/7 and with a leukocytosis as well as abnormal x-ray treated with 5 days of Levaquin. With continued leukocytosis and her regimen was subsequently transitioned to 10 days of doxycycline when she began to note improvement in her symptoms particularly her cough. She reports that she has lost approximately 4 pounds but admittedly has been trying to. She endorses chronic sweats but has also experienced chills lately. Patient reports her cough is at baseline and is productive of a  thick sputum in the morning with a blood tinge. The remainder of the day her cough produces a green and yellow mucus that is thick as well. She denies any perceived lymphadenopathy in her neck, groin, or axilla. He does endorse some mild throat pain. She denies any bone pain. She denies any dysphagia or odynophagia although she reportedly did aspirate on some food approximately 2 months ago. She denies any headache or acute vision changes. She denies any focal weakness, numbness, or tingling. She denies any abdominal pain or nausea. She denies any melena or hematochezia.  Subjective:  Patient would bronchoscopy with intraocular brushing and lavage of right upper lobe yesterday. No adverse effects afterwards. Cough remains essentially nonproductive. No hemoptysis. No fever, chills, or sweats.  Review of Systems:  No abdominal pain, nausea, or emesis. No chest pain or tightness.  Temp:  [97.7 F (36.5 C)-98.2 F (36.8 C)] 98 F (36.7 C) (10/02 0841) Pulse Rate:  [77-113] 113 (10/02 0841) Resp:  [14-31] 20 (10/02 0841) BP: (78-137)/(35-117) 137/59 (10/02 0841) SpO2:  [85 %-100 %] 91 % (10/02 0927) Weight:  [122 lb (55.3 kg)] 122 lb (55.3 kg) (10/01 1234)  General:  Awake. Alert. No distress. Integument:  Warm. Dry. No rash. Extremities:  No cyanosis or clubbing.  HEENT:  No scleral icterus. No oral ulcers. Moist mucus membranes. Cardiovascular:  Regular rate. No edema. Regular rhythm.  Pulmonary:  Good aeration bilaterally. Normal work of breathing on nasal cannula. Abdomen: Soft. Normal bowel sounds. Nondistended.  Neurological: No meningismus. Alert and oriented 4.  CBC Latest Ref Rng & Units 02/26/2017 02/25/2017 02/24/2017  WBC 4.0 - 10.5 K/uL 8.3 5.8 7.3  Hemoglobin 12.0 - 15.0 g/dL 11.7(L) 12.5  12.1  Hematocrit 36.0 - 46.0 % 36.2 36.8 36.9  Platelets 150 - 400 K/uL 320 310 322   BMP Latest Ref Rng & Units 02/26/2017 02/25/2017 02/24/2017  Glucose 65 - 99 mg/dL 110(H) 87 171(H)  BUN 6 -  20 mg/dL _0 Creatinine 0.44 - 1.00 mg/dL 0.41(L) 0.41(L) 0.43(L)  Sodium 135 - 145 mmol/L 134(L) 137 135  Potassium 3.5 - 5.1 mmol/L 4.1 4.3 4.4  Chloride 101 - 111 mmol/L 97(L) 102 101  CO2 22 - 32 mmol/L _1 Calcium 8.9 - 10.3 mg/dL 8.4(L) 8.5(L) 8.4(L)    IMAGING/STUDIES: CTA CHEST 9/29:  Previously reviewed by me. Small pericardial effusion. Mild apical predominant emphysematous changes. Approximately 2.6 cm right adrenal nodule appreciated. No obvious skeletal metastases. Right upper lobe central mass noted with evidence of what appears to be lymphangitic spread. Right suprahilar soft tissue density suspicious for mass versus pathologically enlarged lymph nodes causing either extrinsic compression of airway or endobronchial invasion. No obvious pulmonary arterial invasion. Unable to appreciate pathologic mediastinal or right hilar adenopathy with possible mass invasion. No pleural effusion or thickening appreciated. Linear opacity left lung base suggestive of atelectasis. Necrotic mass present within the lingula measuring up to 2.1 cm. Additional left upper lobe nodule measuring approximately 1 cm identified. TTE 9/29:  LV normal in size with EF 65-70%. No regional wall motion abnormalities & grade 1 diastolic dysfunction. LA & RA normal in size. RV normal in size and function. Pulmonary artery systolic pressure 32 mmHg. No aortic stenosis or regurgitation. No mitral stenosis or regurgitation. No significant pulmonic regurgitation with poorly visualized valve. No tricuspid regurgitation. No pericardial effusion. RUL ENDOBRONCHIAL BRUSHING & CYTOLOGY 10/1:  No definitive malignancy   MICROBIOLOGY: Blood Cultures x2 9/29 >>> Urine Culture 9/28:  Multiple species Urine Streptococcal Antigen 9/29:  Negative  Urine Legionella Antigen 9/29:  Negative  Quantitative BAL Culture RUL 10/1 >>>  ANTIBIOTICS: Vancomycin 9/29 10/1 Aztreonam 9/29 - 10/1 Doxycycline 10/1  >>>  ASSESSMENT/PLAN:  71 y.o. female with long history of tobacco use presenting with hemoptysis. Patient with a clinical picture consistent with a right upper lobe postobstructive pneumonia. Bronchoscopy yesterday yielded no cytology/pathology results that would provide definitive diagnosis for or against malignancy. Cultures remain pending from lavage as well.   1. Postobstructive pneumonia: Transitioned from vancomycin & aztreonam to doxycycline yesterday. Awaiting finalization of lavage culture. Recommend a 10 day course of Doxycycline. 2. Right upper lobe mass: Patient will need bronchoscopy with endobronchial ultrasound-guided biopsy again. Plan to schedule at follow-up appointment. 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 Plan for resection in 2-3 weeks.  4. Hemoptysis: Resolved. 5. COPD/asthma: Recommend continuing twice daily Mucinex. Recommend discharge with home nebulizer & Duoneb q4hr prn. I will address controller medication at follow-up appointment. 6. Tobacco use disorder:  Plan to readdress at follow-up appointment.  7. Follow-up:  Patient scheduled for follow-up with me on 10/12 at 9:30am.   Remainder of care as per primary service and other consultants.  Sonia Baller Ashok Cordia, M.D. [facility] Pulmonary & Critical Care Pager:  [phone] After 3pm or if no response, call [phone] 12:30 PM 02/26/17

## 2017-02-26 NOTE — Progress Notes (Signed)
SATURATION QUALIFICATIONS: (This note is used to comply with regulatory documentation for home oxygen)    Patient Saturations on Room Air while Ambulating = 86%  Patient Saturations on 2 Liters of oxygen while Ambulating = 90%  Please briefly explain why patient needs home oxygen: Breathing treatments tried and failed.

## 2017-02-26 NOTE — Evaluation (Signed)
Physical Therapy Evaluation-1x Patient Details Name: [name] MRN: [id number] DOB: [birth year] Today's Date: 02/26/2017    SATURATION QUALIFICATIONS: (This note is used to comply with regulatory documentation for home oxygen)   Patient Saturations on Room Air while Ambulating = 86%  Patient Saturations on 2 Liters of oxygen while Ambulating = 90%     History of Present Illness  71 yo female admitted with pna, lung mass, L vocal cord lesion  Clinical Impression  On eval, pt ws Mod Ind-Ind with mobility. She walked ~1000 feet around unit. No LOB. No acute PT needs. Any further ambulatory O2 assessments can/should be completed by nursing. Will sign off.     Follow Up Recommendations No PT follow up    Equipment Recommendations  None recommended by PT    Recommendations for Other Services       Precautions / Restrictions Precautions Precautions: None Restrictions Weight Bearing Restrictions: No      Mobility  Bed Mobility Overal bed mobility: Independent                Transfers Overall transfer level: Independent                  Ambulation/Gait Ambulation/Gait assistance: Modified independent (Device/Increase time) Ambulation Distance (Feet): 1000 Feet Assistive device: None Gait Pattern/deviations: WFL(Within Functional Limits)     General Gait Details: pt tolerated distance well.   Stairs            Wheelchair Mobility    Modified Rankin (Stroke Patients Only)       Balance Overall balance assessment: No apparent balance deficits (not formally assessed)                                           Pertinent Vitals/Pain Pain Assessment: No/denies pain    Home Living Family/patient expects to be discharged to:: Private residence Living Arrangements: Spouse/significant other Available Help at Discharge: Family Type of Home: House Home Access: [place]: One [place]:  Environmental consultant - 2 wheels;Cane - single point      Prior Function Level of Independence: Independent               Hand Dominance        Extremity/Trunk Assessment   Upper Extremity Assessment Upper Extremity Assessment: Overall WFL for tasks assessed    Lower Extremity Assessment Lower Extremity Assessment: Overall WFL for tasks assessed    Cervical / Trunk Assessment Cervical / Trunk Assessment: Normal  Communication   Communication: No difficulties  Cognition Arousal/Alertness: Awake/alert Behavior During Therapy: WFL for tasks assessed/performed Overall Cognitive Status: Within Functional Limits for tasks assessed                                        General Comments      Exercises     Assessment/Plan    PT Assessment Patent does not need any further PT services  PT Problem List         PT Treatment Interventions      PT Goals (Current goals can be found in the Care Plan section)  Acute Rehab PT Goals Patient Stated Goal: home PT Goal Formulation: All assessment and education complete, DC therapy    Frequency  Barriers to discharge        Co-evaluation               AM-PAC PT "6 Clicks" Daily Activity  Outcome Measure Difficulty turning over in bed (including adjusting bedclothes, sheets and blankets)?: None Difficulty moving from lying on back to sitting on the side of the bed? : None Difficulty sitting down on and standing up from a chair with arms (e.g., wheelchair, bedside commode, etc,.)?: None Help needed moving to and from a bed to chair (including a wheelchair)?: None Help needed walking in hospital room?: None Help needed climbing 3-5 steps with a railing? : None 6 Click Score: 24    End of Session Equipment Utilized During Treatment: Oxygen Activity Tolerance: Patient tolerated treatment well Patient left: in bed;with call bell/phone within reach   PT Visit Diagnosis: Difficulty in walking, not elsewhere  classified (R26.2)    Time: 9381-8299 PT Time Calculation (min) (ACUTE ONLY): 14 min   Charges:   PT Evaluation $PT Eval Low Complexity: 1 Low     PT G Codes:          Weston Anna, MPT Pager: [phone]

## 2017-02-28 ENCOUNTER — Telehealth: Payer: Self-pay | Admitting: *Deleted

## 2017-02-28 LAB — CULTURE, BLOOD (ROUTINE X 2)
Culture: NO GROWTH
Culture: NO GROWTH
SPECIAL REQUESTS: ADEQUATE
SPECIAL REQUESTS: ADEQUATE

## 2017-02-28 LAB — CULTURE, BAL-QUANTITATIVE W GRAM STAIN: Culture: 200 — AB

## 2017-02-28 NOTE — Telephone Encounter (Signed)
Oncology Nurse Navigator Documentation  Oncology Nurse Navigator Flowsheets 02/28/2017  Navigator Location [facility]  Referral date to RadOnc/MedOnc 02/26/2017  Navigator Encounter Type Telephone/I received referral on [name].  I called and scheduled her to be seen at [facility] next week.  She verbalized understanding of appt time and place.   Telephone Outgoing Call  Treatment Phase Abnormal Scans  Barriers/Navigation Needs Coordination of Care  Interventions Coordination of Care  Coordination of Care Appts  Acuity Level 2  Time Spent with Patient 15

## 2017-03-01 NOTE — Discharge Summary (Signed)
[facility] Discharge Summary   Patient: [name] JYN:[id number]   PCP: Kelsey Lass, MD DOB: 05/10/1946   Date of admission: 02/22/2017   Date of discharge: 02/26/2017     Discharge Diagnoses:  Principal Problem:   Postobstructive pneumonia Active Problems:   Tobacco use   COPD (chronic obstructive pulmonary disease) ([place])   Hyperlipemia   Hypertension   Depression   Type 2 diabetes mellitus ([place])   Lung mass   Admitted From: home Disposition:  Home   Recommendations for Outpatient Follow-up:  1. Please follow up with PCP and pulmonary    Diet recommendation: regular diet  Activity: The patient is advised to gradually reintroduce usual activities.  Discharge Condition: good  Code Status: full code  History of present illness: As per the H and P dictated on admission, "[name] is a 72 y.o. female with medical history significant of osteoarthritis, asthma, COPD, depression, diet-controlled type 2 diabetes, hyperlipidemia, hypertension who is coming to the emergency department with complaints of progressively worse dyspnea, productive cough, decreased appetite, fatigue, hemoptysis for almost a month, fever today despite using oral antibiotics for several days (Levaquin). She also mentions a weight loss of 4 pounds in the last month. She denies headache, sore throat, chest pain, palpitations, dizziness, diaphoresis or pitting edema of the lower extremities. Denies abdominal pain, nausea, emesis, diarrhea, constipation, dysuria, frequency, hematuria, polyuria, polydipsia or blurred vision."  Hospital Course:  Summary of her active problems in the hospital is as following. Postobstructive pneumonia Pt was given Aztreonam and vancomycin now changed to doxycycline Infectious work up negative so far.   Active Problems: Lung mass Appreciate consult from pulmonary, S/P bronchoscopy. Follow-up on the results. Prelim finding negative, pt will have outpatient  EBUS.  Tobacco use Declined nicotine replacement therapy.  COPD (chronic obstructive pulmonary disease) (HCC) Continue supplemental oxygen. Bronchodilators as needed.  Hyperlipemia Continue simvastatin 40 mg by mouth daily for pharmacy formulary equivalent. Follow-up LFTs as needed.  Hypertension Continue losartan 50 mg by mouth daily. Monitor for low pressure, renal function and electrolytes.  Depression Continue Celexa 20 mg by mouth daily.  Type 2 diabetes mellitus (HCC) Carbohydrate modified diet.  Vocal cord polyp. Appreciate ENT assistance. Seen at the time of the bronchoscopy, appreciate pulmonary assistance as well. Patient will be getting an outpatient Riley biopsy of this lesion to rule out any malignancy there as well.  All other chronic medical condition were stable during the hospitalization.  Patient was seen by physical therapy, who recommended no PT follow up needed. On the day of the discharge the patient's vitals were stable, and no other acute medical condition were reported by patient. the patient was felt safe to be discharge at home with family.  Procedures and Results:  bronchsopy with brushing   Consultations:  PCCM   DISCHARGE MEDICATION: Discharge Medication List as of 02/26/2017  3:54 PM    START taking these medications   Details  doxycycline (VIBRA-TABS) 100 MG tablet Take 1 tablet (100 mg total) by mouth every 12 (twelve) hours., Starting Tue 02/26/2017, Until Tue 03/05/2017, Normal    guaiFENesin (MUCINEX) 600 MG 12 hr tablet Take 2 tablets (1,200 mg total) by mouth 2 (two) times daily., Starting Tue 02/26/2017, Normal    ipratropium-albuterol (DUONEB) 0.5-2.5 (3) MG/3ML SOLN Take 3 mLs by nebulization every 6 (six) hours as needed., Starting Tue 02/26/2017, Normal    polyethylene glycol (MIRALAX / GLYCOLAX) packet Take 17 g by mouth daily., Starting Wed 02/27/2017, Normal  CONTINUE these medications which have  NOT CHANGED   Details  aspirin 81 MG tablet Take 81 mg by mouth daily., Historical Med    citalopram (CELEXA) 20 MG tablet Take 10 mg by mouth daily. , Historical Med    losartan (COZAAR) 50 MG tablet Take 50 mg by mouth daily., Starting Wed 12/26/2016, Historical Med    omeprazole (PRILOSEC) 20 MG capsule Take 20 mg by mouth daily., Historical Med    simvastatin (ZOCOR) 40 MG tablet Take 40 mg by mouth every evening., Historical Med      STOP taking these medications     Ascorbic Acid (VITAMIN C) 1000 MG tablet      ipratropium (ATROVENT HFA) 17 MCG/ACT inhaler      pyridOXINE (VITAMIN B-6) 100 MG tablet      valsartan (DIOVAN) 80 MG tablet        Allergies  Allergen Reactions  . Penicillins Hives    Has patient had a PCN reaction causing immediate rash, facial/tongue/throat swelling, SOB or lightheadedness with hypotension: no Has patient had a PCN reaction causing severe rash involving mucus membranes or skin necrosis: no Has patient had a PCN reaction that required hospitalization: yes Has patient had a PCN reaction occurring within the last 10 years: no If all of the above answers are "NO", then may proceed with Cephalosporin use.   . Tiotropium Bromide Monohydrate Other (See Comments)    EYE PAIN KIDNEY FUNCTION SLOWED DOWN   Discharge Instructions    Diet general    Complete by:  As directed    Discharge instructions    Complete by:  As directed    It is important that you read following instructions as well as go over your medication list with RN to help you understand your care after this hospitalization.  Discharge Instructions: Please follow-up with PCP in one week  Please request your primary care physician to go over all Hospital Tests and Procedure/Radiological results at the follow up,  Please get all Hospital records sent to your PCP by signing hospital release before you go home.   Do not take more than prescribed Pain, Sleep and Anxiety  Medications. You were cared for by a hospitalist during your hospital stay. If you have any questions about your discharge medications or the care you received while you were in the hospital after you are discharged, you can call the unit and ask to speak with the hospitalist on call if the hospitalist that took care of you is not available.  Once you are discharged, your primary care physician will handle any further medical issues. Please note that NO REFILLS for any discharge medications will be authorized once you are discharged, as it is imperative that you return to your primary care physician (or establish a relationship with a primary care physician if you do not have one) for your aftercare needs so that they can reassess your need for medications and monitor your lab values. You Must read complete instructions/literature along with all the possible adverse reactions/side effects for all the Medicines you take and that have been prescribed to you. Take any new Medicines after you have completely understood and accept all the possible adverse reactions/side effects. Wear Seat belts while driving. If you have smoked or chewed Tobacco in the last 2 yrs please stop smoking and/or stop any Recreational drug use.   Increase activity slowly    Complete by:  As directed      Discharge Exam: Filed Weights  02/22/17 1626 02/23/17 1807 02/25/17 1234  Weight: 54.4 kg (120 lb) 55.5 kg (122 lb 5.7 oz) 55.3 kg (122 lb)   Vitals:   02/26/17 0927 02/26/17 1312  BP:  124/66  Pulse:  99  Resp:  20  Temp:  98.2 F (36.8 C)  SpO2: 91% 92%   General: Appear in mild distress, no Rash; Oral Mucosa moist. Cardiovascular: S1 and S2 Present, no Murmur, no JVD Respiratory: Bilateral Air entry present and basalrackles, Occasional wheezes Abdomen: Bowel Sound present, Soft and no tenderness Extremities: no Pedal edema, no calf tenderness Neurology: Grossly no focal neuro deficit.  The results of  significant diagnostics from this hospitalization (including imaging, microbiology, ancillary and laboratory) are listed below for reference.    Significant Diagnostic Studies: Dg Chest 2 View  Result Date: 02/22/2017 CLINICAL DATA:  Shortness of breath.  Cough. EXAM: CHEST  2 VIEW COMPARISON:  Radiograph earlier this day. Also 02/05/2017, 02/01/2017 FINDINGS: Unchanged right apical opacity from exam earlier this day, which has a slightly mass like appearance. Additional patchy opacities throughout the right mid and upper lung zone as well as left mid lung. There is elevation of the right hemidiaphragm with questionable small effusion. No pneumothorax. The heart is normal in size. There is right hilar prominence. IMPRESSION: Increasing right apical opacity, slightly mass like in appearance. Given increase over the past 3 weeks, concerning for malignancy. Recommend chest CT characterization, preferably with IV contrast. Electronically Signed   By: Jeb Levering M.D.   On: 02/22/2017 22:29   Ct Angio Chest Pe W/cm &/or Wo Cm  Result Date: 02/23/2017 CLINICAL DATA:  Persistent cough.  Fever. EXAM: CT ANGIOGRAPHY CHEST WITH CONTRAST TECHNIQUE: Multidetector CT imaging of the chest was performed using the standard protocol during bolus administration of intravenous contrast. Multiplanar CT image reconstructions and MIPs were obtained to evaluate the vascular anatomy. CONTRAST:  100 cc Isovue 370 IV COMPARISON:  Chest radiograph yesterday, additional priors. FINDINGS: Cardiovascular: There are no filling defects within the pulmonary arteries to suggest pulmonary embolus. Right hilar mass encases the right pulmonary artery causing mild luminal narrowing, however no evidence of invasion. Aortic atherosclerosis without aneurysm. The heart is normal in size. Small pericardial effusion measures up to 8 mm in depth. Right superior mediastinal soft tissue density causes mass effect on the IVC with narrowing, possible  IVC invasion image 115 series 6. Contrast refluxes into the hemi azygous system. Mediastinum/Nodes: Right apical masslike opacity is contiguous with right hilar nodal tissue, discrete nodes are difficult to differentiate. Anterior right hilar mass measures approximately 3.4 x 2.0 cm. This mass causes significant narrowing of the right upper lobe bronchus and encases the right main pulmonary artery. A lower paratracheal node measures 7 mm short axis. Prominent subcarinal node measures approximately 12 mm. There is no left hilar adenopathy. No definite supraclavicular adenopathy. No axillary adenopathy. The esophagus is decompressed. Visualized thyroid gland is normal. Lungs/Pleura: Ill-defined right apical masslike opacity measures 3.5 x 3.1 cm with irregular spiculated borders and surrounding ground-glass opacity. Spiculations and ground-glass opacity extend to the pleural surface peripherally anteriorly. This is contiguous with right hilar soft tissue density that measures approximately 3.4 x 2.0 cm. There is postobstructive atelectasis of the anterior right upper lobe. Right hilar mass causes significant narrowing of the right upper lobe bronchus with subsequent volume loss in the right lung. Within the anterior left upper lobe is a 2.1 x 1.8 cm spiculated nodule with central air consistent with necrosis. Mild emphysema.  No  pleural fluid. Upper Abdomen: Heterogeneous right adrenal nodule measures 2.6 x 1.4 cm. Left adrenal gland not included in the field of view. Low-density adjacent with falciform ligament in the liver, typically focal fatty infiltration. Musculoskeletal: No evidence of osseous metastatic disease. No blastic or destructive lytic lesions. Age related degenerative change in the spine. Review of the MIP images confirms the above findings. IMPRESSION: 1. Right apical masslike opacity, contiguous with right hilar soft tissue density, consistent with bronchogenic malignancy. Unclear whether the  primary lesion is the apical or hilar mass. Right hilar mass encases the right upper lobe bronchus causing luminal narrowing as well as narrowing of the right pulmonary artery. There is mass effect on the IVC with possible invasion. Postobstructive atelectasis of the anterior right upper lung. 2. Necrotic left lung nodule measuring 2.1 cm may be a satellite nodule or second primary bronchogenic malignancy. 3. Right adrenal nodule, suspicious for metastatic disease but technically indeterminate. 4. No pulmonary embolus. 5. Aortic Atherosclerosis (ICD10-I70.0) and Emphysema (ICD10-J43.9). Electronically Signed   By: Jeb Levering M.D.   On: 02/23/2017 01:46    Microbiology: Recent Results (from the past 240 hour(s))  Urine culture     Status: Abnormal   Collection Time: 02/22/17  9:52 PM  Result Value Ref Range Status   Specimen Description URINE, RANDOM  Final   Special Requests NONE  Final   Culture MULTIPLE SPECIES PRESENT, SUGGEST RECOLLECTION (A)  Final   Report Status 02/24/2017 FINAL  Final  Culture, blood (routine x 2) Call MD if unable to obtain prior to antibiotics being given     Status: None   Collection Time: 02/23/17  7:20 AM  Result Value Ref Range Status   Specimen Description BLOOD RIGHT ANTECUBITAL  Final   Special Requests   Final    BOTTLES DRAWN AEROBIC AND ANAEROBIC Blood Culture adequate volume   Culture   Final    NO GROWTH 5 DAYS Performed at [facility], [place] [address] [place], [place] [address]    Report Status 02/28/2017 FINAL  Final  Culture, blood (routine x 2) Call MD if unable to obtain prior to antibiotics being given     Status: None   Collection Time: 02/23/17  7:32 AM  Result Value Ref Range Status   Specimen Description BLOOD RIGHT ANTECUBITAL  Final   Special Requests   Final    BOTTLES DRAWN AEROBIC AND ANAEROBIC Blood Culture adequate volume   Culture   Final    NO GROWTH 5 DAYS Performed at [facility], [place] [address],  [place], [place] [address]    Report Status 02/28/2017 FINAL  Final  Culture, bal-quantitative     Status: Abnormal   Collection Time: 02/25/17  1:20 PM  Result Value Ref Range Status   Specimen Description BRONCHIAL ALVEOLAR LAVAGE  Final   Special Requests RUL  Final   Gram Stain   Final    ABUNDANT WBC PRESENT,BOTH PMN AND MONONUCLEAR NO ORGANISMS SEEN    Culture (A)  Final    200 COLONIES/mL Consistent with normal respiratory flora. Performed at [facility], [place] [address] [place], [place] [address]    Report Status 02/28/2017 FINAL  Final     Labs: CBC:  Recent Labs Lab 02/22/17 2249 02/24/17 0559 02/25/17 0450 02/26/17 0452  WBC 12.6* 7.3 5.8 8.3  NEUTROABS 8.0* 4.9 3.2 5.5  HGB 14.2 12.1 12.5 11.7*  HCT 41.3 36.9 36.8 36.2  MCV 95.8 97.1 95.3 97.8  PLT 429* 322 310 456   Basic Metabolic Panel:  Recent Labs Lab 02/22/17 2249 02/24/17 0559 02/25/17 0450 02/26/17 0452  NA 135 135 137 134*  K 4.2 4.4 4.3 4.1  CL 94* 101 102 97*  CO2 _0 GLUCOSE 161* 171* 87 110*  BUN _1 CREATININE 0.58 0.43* 0.41* 0.41*  CALCIUM 9.0 8.4* 8.5* 8.4*  MG  --  1.7 1.9 1.7   Liver Function Tests:  Recent Labs Lab 02/24/17 0559  AST 6*  ALT 9*  ALKPHOS 48  BILITOT 0.4  PROT 6.0*  ALBUMIN 2.7*   No results for input(s): LIPASE, AMYLASE in the last 168 hours. No results for input(s): AMMONIA in the last 168 hours. Cardiac Enzymes:  Recent Labs Lab 02/22/17 2249  TROPONINI <0.03   BNP (last 3 results)  Recent Labs  02/22/17 2249  BNP 15.0   CBG:  Recent Labs Lab 02/25/17 1142 02/25/17 1823 02/25/17 2013 02/26/17 0805 02/26/17 1154  GLUCAP 123* 219* 237* 135* 170*   Time spent: 35  minutes  Signed:  Kolyn Rozario  [facility] 02/26/2017 , 12:51 AM

## 2017-03-06 ENCOUNTER — Other Ambulatory Visit: Payer: Self-pay | Admitting: Medical Oncology

## 2017-03-06 DIAGNOSIS — R918 Other nonspecific abnormal finding of lung field: Secondary | ICD-10-CM

## 2017-03-07 ENCOUNTER — Ambulatory Visit (HOSPITAL_BASED_OUTPATIENT_CLINIC_OR_DEPARTMENT_OTHER): Payer: Medicare Other | Admitting: Internal Medicine

## 2017-03-07 ENCOUNTER — Encounter: Payer: Self-pay | Admitting: Internal Medicine

## 2017-03-07 ENCOUNTER — Other Ambulatory Visit (HOSPITAL_BASED_OUTPATIENT_CLINIC_OR_DEPARTMENT_OTHER): Payer: Medicare Other

## 2017-03-07 ENCOUNTER — Ambulatory Visit: Payer: Medicare Other | Admitting: Internal Medicine

## 2017-03-07 VITALS — BP 104/32 | HR 104 | Temp 98.1°F | Resp 18 | Ht 60.0 in | Wt 124.3 lb

## 2017-03-07 DIAGNOSIS — R918 Other nonspecific abnormal finding of lung field: Secondary | ICD-10-CM

## 2017-03-07 DIAGNOSIS — R591 Generalized enlarged lymph nodes: Secondary | ICD-10-CM

## 2017-03-07 DIAGNOSIS — C3490 Malignant neoplasm of unspecified part of unspecified bronchus or lung: Secondary | ICD-10-CM

## 2017-03-07 DIAGNOSIS — E279 Disorder of adrenal gland, unspecified: Secondary | ICD-10-CM

## 2017-03-07 LAB — CBC WITH DIFFERENTIAL/PLATELET
BASO%: 0.9 % (ref 0.0–2.0)
BASOS ABS: 0.1 10*3/uL (ref 0.0–0.1)
EOS%: 2.9 % (ref 0.0–7.0)
Eosinophils Absolute: 0.2 10*3/uL (ref 0.0–0.5)
HEMATOCRIT: 37 % (ref 34.8–46.6)
HEMOGLOBIN: 12.1 g/dL (ref 11.6–15.9)
LYMPH#: 1.8 10*3/uL (ref 0.9–3.3)
LYMPH%: 22.4 % (ref 14.0–49.7)
MCH: 31.8 pg (ref 25.1–34.0)
MCHC: 32.8 g/dL (ref 31.5–36.0)
MCV: 96.9 fL (ref 79.5–101.0)
MONO#: 0.8 10*3/uL (ref 0.1–0.9)
MONO%: 10.7 % (ref 0.0–14.0)
NEUT#: 4.9 10*3/uL (ref 1.5–6.5)
NEUT%: 63.1 % (ref 38.4–76.8)
PLATELETS: 436 10*3/uL — AB (ref 145–400)
RBC: 3.82 10*6/uL (ref 3.70–5.45)
RDW: 12.5 % (ref 11.2–14.5)
WBC: 7.8 10*3/uL (ref 3.9–10.3)

## 2017-03-07 LAB — COMPREHENSIVE METABOLIC PANEL
ALBUMIN: 2.7 g/dL — AB (ref 3.5–5.0)
ALK PHOS: 62 U/L (ref 40–150)
ALT: 7 U/L (ref 0–55)
ANION GAP: 8 meq/L (ref 3–11)
AST: 6 U/L (ref 5–34)
BUN: 9.2 mg/dL (ref 7.0–26.0)
CALCIUM: 9.8 mg/dL (ref 8.4–10.4)
CHLORIDE: 99 meq/L (ref 98–109)
CO2: 31 mEq/L — ABNORMAL HIGH (ref 22–29)
CREATININE: 0.8 mg/dL (ref 0.6–1.1)
EGFR: >60 mL/min/{1.73_m2} (ref >60)
Glucose: 125 mg/dl (ref 70–140)
Potassium: 4.3 mEq/L (ref 3.5–5.1)
Sodium: 138 mEq/L (ref 136–145)
Total Bilirubin: <0.22 mg/dL (ref 0.20–1.20)
Total Protein: 6.4 g/dL (ref 6.4–8.3)

## 2017-03-07 NOTE — Progress Notes (Signed)
[place] [phone]   [phone] Multidisciplinary thoracic oncology clinic  CONSULT NOTE  REFERRING PHYSICIAN: Dr. Tera Partridge  REASON FOR CONSULTATION:  71 years old white female with highly suspicious for lung cancer.  HPI [name] is a 71 y.o. female was past medical history significant for hypertension, diabetes mellitus, COPD, osteoarthritis, dyslipidemia and long history of smoking. The patient mentions that she has been complaining of cough and shortness of breath as well as chest pain for several months. She was seen by her primary care physician for routine evaluation and management of her diabetes mellitus and she was noted to have elevated white blood count and low oxygen saturation. She was treated with few courses of antibiotics with no improvement in her condition. Chest x-ray was performed on 02/22/2017 and it showed increased right apical opacity. This was followed by CT angiogram of the chest on 02/23/2017 and it showed ill-defined right apical mass like opacity measuring 3.5 x 3.1 cm with irregular spiculated borders and surrounding groundglass opacity. The spiculations and ground glass opacity extending to the pleural surface peripherally anteriorly. This is contiguous with right hilar soft tissue density that measured approximately 3.4 x 2.0 cm. There was postoperative obstructive atelectasis of the anterior right upper lobe. The right hilar mass causing significant narrowing of the right upper lobe bronchus with subsequent volume loss in the right lung and encases the right main pulmonary artery. There was also spiculated nodule measuring 2.1 x 1.8 cm within the anterior left upper lobe with central a are consistent with necrosis. There was also a lower paratracheal node measuring 0.7 cm in short axis and a prominent subcarinal node measuring approximately 1.2 cm. On 02/25/2017 the patient underwent bronchoscopy with airway any  suspension and endobronchial brushings and lavage under the care of Dr. Ashok Cordia. The procedure showed left focal cord polyp in addition to the right upper lobe mass and postobstructive pneumonia. The final cytology ([id number]) of the bronchial lavage of the right upper lobe showed atypical cells but was not sufficient for a diagnosis of malignancy. Dr. Ashok Cordia kindly referred the patient to the multidisciplinary thoracic oncology clinic today for further evaluation and recommendation regarding treatment of her condition. When seen today the patient continues to complain of cough productive of thick whitish sputum as well as shortness breath. She denied having any chest pain or hemoptysis. She has no significant weight loss or night sweats. She has no nausea, vomiting, diarrhea or constipation. She denied having any headache or visual changes. Family history significant for a father diagnosed with colon cancer and died at age 31, mother had diabetes mellitus and paternal aunt and a cousin with breast cancer. The patient is married and has 1 daughter. She was accompanied by her husband, Marden Noble. She used to work in an [employer] fund[employer]. The patient has a history of smoking 1 pack per day for around 50 years and quit 2 weeks ago. She has no history of alcohol or drug abuse.  HPI  Past Medical History:  Diagnosis Date  . Arthritis   . Asthma   . COPD (chronic obstructive pulmonary disease) ([place])   . Depression   . Diabetes mellitus    diet controlled  . Hyperlipemia   . Hypertension     Past Surgical History:  Procedure Laterality Date  . CARDIAC CATHETERIZATION  1999  . CARPAL TUNNEL RELEASE  2/13   right-[facility]  . CARPAL TUNNEL RELEASE  08/15/2011   Procedure: CARPAL TUNNEL  RELEASE;  Surgeon: Linna Hoff, MD;  Location: [facility];  Service: Orthopedics;  Laterality: Left;  . CATARACT EXTRACTION    . CERVICAL FUSION  2002  . COLONOSCOPY    . TONSILLECTOMY    . VIDEO  BRONCHOSCOPY Bilateral 02/25/2017   Procedure: VIDEO BRONCHOSCOPY WITHOUT FLUORO;  Surgeon: Javier Glazier, MD;  Location: [facility];  Service: Cardiopulmonary;  Laterality: Bilateral;    Family History  Problem Relation Age of Onset  . Diabetes Mother   . Colon cancer Father   . Cerebral palsy Brother   . Breast cancer Paternal Aunt   . Breast cancer Cousin   . Prostate cancer Paternal Uncle   . Bone cancer Maternal Aunt     Social History Social History  Substance Use Topics  . Smoking status: Current Every Day Smoker    Packs/day: 1.00    Years: 53.00    Start date: 12/22/1962  . Smokeless tobacco: Never Used     Comment: Peak rate 1.5ppd - quit at most 6 months  . Alcohol use No    Allergies  Allergen Reactions  . Penicillins Hives    Has patient had a PCN reaction causing immediate rash, facial/tongue/throat swelling, SOB or lightheadedness with hypotension: no Has patient had a PCN reaction causing severe rash involving mucus membranes or skin necrosis: no Has patient had a PCN reaction that required hospitalization: yes Has patient had a PCN reaction occurring within the last 10 years: no If all of the above answers are "NO", then may proceed with Cephalosporin use.   . Tiotropium Bromide Monohydrate Other (See Comments)    EYE PAIN KIDNEY FUNCTION SLOWED DOWN    Current Outpatient Prescriptions  Medication Sig Dispense Refill  . aspirin 81 MG tablet Take 81 mg by mouth daily.    . citalopram (CELEXA) 20 MG tablet Take 10 mg by mouth daily.     Marland Kitchen guaiFENesin (MUCINEX) 600 MG 12 hr tablet Take 2 tablets (1,200 mg total) by mouth 2 (two) times daily. 60 tablet 0  . ipratropium-albuterol (DUONEB) 0.5-2.5 (3) MG/3ML SOLN Take 3 mLs by nebulization every 6 (six) hours as needed. 90 mL 0  . losartan (COZAAR) 50 MG tablet Take 50 mg by mouth daily.  0  . omeprazole (PRILOSEC) 20 MG capsule Take 20 mg by mouth daily.    . polyethylene glycol (MIRALAX / GLYCOLAX)  packet Take 17 g by mouth daily. 14 each 0  . simvastatin (ZOCOR) 40 MG tablet Take 40 mg by mouth every evening.     No current facility-administered medications for this visit.     Review of Systems  Constitutional: positive for fatigue Eyes: negative Ears, nose, mouth, throat, and face: negative Respiratory: positive for cough, dyspnea on exertion and sputum Cardiovascular: negative Gastrointestinal: negative Genitourinary:negative Integument/breast: negative Hematologic/lymphatic: negative Musculoskeletal:negative Neurological: negative Behavioral/Psych: negative Endocrine: negative Allergic/Immunologic: negative  Physical Exam  XFG:HWEXH, healthy, no distress, well nourished, well developed and anxious SKIN: skin color, texture, turgor are normal, no rashes or significant lesions HEAD: Normocephalic, No masses, lesions, tenderness or abnormalities EYES: normal, PERRLA, Conjunctiva are pink and non-injected EARS: External ears normal, Canals clear OROPHARYNX:no exudate, no erythema and lips, buccal mucosa, and tongue normal  NECK: supple, no adenopathy, no JVD LYMPH:  no palpable lymphadenopathy, no hepatosplenomegaly BREAST:not examined LUNGS: expiratory wheezes bilaterally HEART: regular rate & rhythm, no murmurs and no gallops ABDOMEN:abdomen soft, non-tender, normal bowel sounds and no masses or organomegaly BACK: No CVA tenderness,  Range of motion is normal EXTREMITIES:no joint deformities, effusion, or inflammation, no edema, no skin discoloration  NEURO: alert & oriented x 3 with fluent speech, no focal motor/sensory deficits  PERFORMANCE STATUS: ECOG 1  LABORATORY DATA: Lab Results  Component Value Date   WBC 8.3 02/26/2017   HGB 11.7 (L) 02/26/2017   HCT 36.2 02/26/2017   MCV 97.8 02/26/2017   PLT 320 02/26/2017      Chemistry      Component Value Date/Time   NA 134 (L) 02/26/2017 0452   K 4.1 02/26/2017 0452   CL 97 (L) 02/26/2017 0452   CO2 30  02/26/2017 0452   BUN 7 02/26/2017 0452   CREATININE 0.41 (L) 02/26/2017 0452      Component Value Date/Time   CALCIUM 8.4 (L) 02/26/2017 0452   ALKPHOS 48 02/24/2017 0559   AST 6 (L) 02/24/2017 0559   ALT 9 (L) 02/24/2017 0559   BILITOT 0.4 02/24/2017 0559       RADIOGRAPHIC STUDIES: Dg Chest 2 View  Result Date: 02/22/2017 CLINICAL DATA:  Shortness of breath.  Cough. EXAM: CHEST  2 VIEW COMPARISON:  Radiograph earlier this day. Also 02/05/2017, 02/01/2017 FINDINGS: Unchanged right apical opacity from exam earlier this day, which has a slightly mass like appearance. Additional patchy opacities throughout the right mid and upper lung zone as well as left mid lung. There is elevation of the right hemidiaphragm with questionable small effusion. No pneumothorax. The heart is normal in size. There is right hilar prominence. IMPRESSION: Increasing right apical opacity, slightly mass like in appearance. Given increase over the past 3 weeks, concerning for malignancy. Recommend chest CT characterization, preferably with IV contrast. Electronically Signed   By: Jeb Levering M.D.   On: 02/22/2017 22:29   Ct Angio Chest Pe W/cm &/or Wo Cm  Result Date: 02/23/2017 CLINICAL DATA:  Persistent cough.  Fever. EXAM: CT ANGIOGRAPHY CHEST WITH CONTRAST TECHNIQUE: Multidetector CT imaging of the chest was performed using the standard protocol during bolus administration of intravenous contrast. Multiplanar CT image reconstructions and MIPs were obtained to evaluate the vascular anatomy. CONTRAST:  100 cc Isovue 370 IV COMPARISON:  Chest radiograph yesterday, additional priors. FINDINGS: Cardiovascular: There are no filling defects within the pulmonary arteries to suggest pulmonary embolus. Right hilar mass encases the right pulmonary artery causing mild luminal narrowing, however no evidence of invasion. Aortic atherosclerosis without aneurysm. The heart is normal in size. Small pericardial effusion measures  up to 8 mm in depth. Right superior mediastinal soft tissue density causes mass effect on the IVC with narrowing, possible IVC invasion image 115 series 6. Contrast refluxes into the hemi azygous system. Mediastinum/Nodes: Right apical masslike opacity is contiguous with right hilar nodal tissue, discrete nodes are difficult to differentiate. Anterior right hilar mass measures approximately 3.4 x 2.0 cm. This mass causes significant narrowing of the right upper lobe bronchus and encases the right main pulmonary artery. A lower paratracheal node measures 7 mm short axis. Prominent subcarinal node measures approximately 12 mm. There is no left hilar adenopathy. No definite supraclavicular adenopathy. No axillary adenopathy. The esophagus is decompressed. Visualized thyroid gland is normal. Lungs/Pleura: Ill-defined right apical masslike opacity measures 3.5 x 3.1 cm with irregular spiculated borders and surrounding ground-glass opacity. Spiculations and ground-glass opacity extend to the pleural surface peripherally anteriorly. This is contiguous with right hilar soft tissue density that measures approximately 3.4 x 2.0 cm. There is postobstructive atelectasis of the anterior right upper lobe. Right hilar mass  causes significant narrowing of the right upper lobe bronchus with subsequent volume loss in the right lung. Within the anterior left upper lobe is a 2.1 x 1.8 cm spiculated nodule with central air consistent with necrosis. Mild emphysema.  No pleural fluid. Upper Abdomen: Heterogeneous right adrenal nodule measures 2.6 x 1.4 cm. Left adrenal gland not included in the field of view. Low-density adjacent with falciform ligament in the liver, typically focal fatty infiltration. Musculoskeletal: No evidence of osseous metastatic disease. No blastic or destructive lytic lesions. Age related degenerative change in the spine. Review of the MIP images confirms the above findings. IMPRESSION: 1. Right apical masslike  opacity, contiguous with right hilar soft tissue density, consistent with bronchogenic malignancy. Unclear whether the primary lesion is the apical or hilar mass. Right hilar mass encases the right upper lobe bronchus causing luminal narrowing as well as narrowing of the right pulmonary artery. There is mass effect on the IVC with possible invasion. Postobstructive atelectasis of the anterior right upper lung. 2. Necrotic left lung nodule measuring 2.1 cm may be a satellite nodule or second primary bronchogenic malignancy. 3. Right adrenal nodule, suspicious for metastatic disease but technically indeterminate. 4. No pulmonary embolus. 5. Aortic Atherosclerosis (ICD10-I70.0) and Emphysema (ICD10-J43.9). Electronically Signed   By: Jeb Levering M.D.   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 The patient does not have confirmed tissue diagnosis of her malignancy.   PLAN: I had a lengthy discussion with the patient and her husband about her current disease status and further investigation to confirm her diagnosis. I recommended for the patient to complete staging workup by ordering a PET scan as well as CT scan of the head to rule out any other metastatic disease. Once the PET scan is performed, I will arrange for the patient to have repeat biopsy most likely with CT-guided core biopsy of the left upper lobe lung nodule or any other accessible soft tissue hypermetabolic lesion. I will arrange for the patient to come back for follow-up visit in 2 weeks for reevaluation and discussion of her treatment options based on the biopsy results and staging workup. The patient was advised to call immediately if she has any concerning  symptoms in the interval. The patient voices understanding of current disease status and treatment options and is in agreement with the current care plan.  All questions were answered. The patient knows to call the clinic with any problems, questions or concerns. We can certainly see the patient much sooner if necessary.  Thank you so much for allowing me to participate in the care of [name]. I will continue to follow up the patient with you and assist in her care.  I spent 40 minutes counseling the patient face to face. The total time spent in the appointment was 60 minutes.  Disclaimer: This note was dictated with voice recognition software. Similar sounding words can inadvertently be transcribed and may not be corrected upon review.   Eilleen Kempf March 07, 2017, 3:31 PM

## 2017-03-07 NOTE — Progress Notes (Signed)
Subjective:    Patient ID: [name], female    DOB: 03-Nov-1945, 71 y.o.   MRN: [id number]  C.C.:  Follow-up after recent hospitalization w/ postobstructive pneumonia.  HPI Postobstructive pneumonia: Patient discharged with a 10 day course of doxycycline. Underwent bronchoscopy with airway inspection, brushings, and lavage during hospitalization. She reports minimal coughing now. She has only rare mucus production.  Right upper lobe mass: Resulting in pathologic mediastinal adenopathy & right upper lobe postobstructive pneumonia. Brushings and cytology from bronchoscopy performed during admission were negative for malignancy. Saw Dr. Earlie Server yesterday and is planned for a PET CT scan.   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 Planned for resection eventually. Gets hoarse the longer she talks and the more she talks. She does have a "soreness" in her throat.  COPD/asthma: Patient discharged with Duonebs to be used as needed. She reports she does have some dyspnea with laying recumbent. She denies any significant wheezing. Coughing can clear her wheezing. She is using her Duoneb twice daily and reports it does seem to help. Previously had some eye discomfort and urinary retention on Spiriva but it did seem to help her breathing. She has had some throat irritation with her Symbicort inhaler and was only doing 1 puff twice daily. She has a Combivent Respimat at home.   Acute hypoxic respiratory failure:  Patient desaturated to 86% on room air with ambulation just prior to discharge. She required 2 L/m to maintain her saturation with ambulation. She is monitoring her saturation at home and reports it does go into the 80s.   Tobacco use disorder: Smoking prior to admission to hospital. She reports she hasn't smoked since she left the hospital - about 2 weeks.   Review of Systems She denies any chest pressure or tightness. She does report a "discomfort" in her chest relieved with  movement. No fever, chills, or sweats. No adenopathy in her neck, groin, or axilla.   Allergies  Allergen Reactions  . Penicillins Hives    Has patient had a PCN reaction causing immediate rash, facial/tongue/throat swelling, SOB or lightheadedness with hypotension: no Has patient had a PCN reaction causing severe rash involving mucus membranes or skin necrosis: no Has patient had a PCN reaction that required hospitalization: yes Has patient had a PCN reaction occurring within the last 10 years: no If all of the above answers are "NO", then may proceed with Cephalosporin use.   . Tiotropium Bromide Monohydrate Other (See Comments)    EYE PAIN KIDNEY FUNCTION SLOWED DOWN    Current Outpatient Prescriptions on File Prior to Visit  Medication Sig Dispense Refill  . aspirin 81 MG tablet Take 81 mg by mouth daily.    . citalopram (CELEXA) 20 MG tablet Take 10 mg by mouth daily.     Marland Kitchen guaiFENesin (MUCINEX) 600 MG 12 hr tablet Take 2 tablets (1,200 mg total) by mouth 2 (two) times daily. 60 tablet 0  . ipratropium-albuterol (DUONEB) 0.5-2.5 (3) MG/3ML SOLN Take 3 mLs by nebulization every 6 (six) hours as needed. 90 mL 0  . losartan (COZAAR) 50 MG tablet Take 50 mg by mouth daily.  0  . omeprazole (PRILOSEC) 20 MG capsule Take 20 mg by mouth daily.    . polyethylene glycol (MIRALAX / GLYCOLAX) packet Take 17 g by mouth daily. 14 each 0  . simvastatin (ZOCOR) 40 MG tablet Take 40 mg by mouth every evening.     No current facility-administered medications on file prior to  visit.     Past Medical History:  Diagnosis Date  . Arthritis   . Asthma   . COPD (chronic obstructive Baxter disease) ([place])   . Depression   . Diabetes mellitus    diet controlled  . Hyperlipemia   . Hypertension     Past Surgical History:  Procedure Laterality Date  . CARDIAC CATHETERIZATION  1999  . CARPAL TUNNEL RELEASE  2/13   right-GSC  . CARPAL TUNNEL RELEASE  08/15/2011   Procedure: CARPAL TUNNEL  RELEASE;  Surgeon: Linna Hoff, MD;  Location: [facility];  Service: Orthopedics;  Laterality: Left;  . CATARACT EXTRACTION    . CERVICAL FUSION  2002  . COLONOSCOPY    . TONSILLECTOMY    . VIDEO BRONCHOSCOPY Bilateral 02/25/2017   Procedure: VIDEO BRONCHOSCOPY WITHOUT FLUORO;  Surgeon: Javier Glazier, MD;  Location: [facility];  Service: Cardiopulmonary;  Laterality: Bilateral;    Family History  Problem Relation Age of Onset  . Diabetes Mother   . Colon cancer Father   . Cerebral palsy Brother   . Breast cancer Paternal Aunt   . Breast cancer Cousin   . Prostate cancer Paternal Uncle   . Bone cancer Maternal Aunt     Social History   Social History  . Marital status: Married    Spouse name: N/A  . Number of children: N/A  . Years of education: N/A   Social History Main Topics  . Smoking status: Former Smoker    Packs/day: 1.00    Years: 53.00    Start date: 12/22/1962    Quit date: 02/25/2017  . Smokeless tobacco: Never Used     Comment: Peak rate 1.5ppd - quit at most 6 months  . Alcohol use No  . Drug use: No  . Sexual activity: Not on file   Other Topics Concern  . Not on file   Social History Narrative   [name] (02/23/17):   Originally from [place]. Previously has lived in [place] as well as [place]. Previously worked with a Engineer, structural and in Engineer, materials. Remote exposure to a parrot. Currently lives with her husband. Retired.      Objective:   Physical Exam BP 118/68 (BP Location: Left Arm, Cuff Size: Normal)   Pulse 98   Ht 5' (1.524 m)   Wt 122 lb (55.3 kg)   SpO2 95%   BMI 23.83 kg/m  General:  Awake. Alert. No acute distress. Accompanied by husband today.  Integument:  Warm & dry. No rash on exposed skin.  Extremities:  No cyanosis or clubbing.  HEENT:  Moist mucus membranes. No oral ulcers. No scleral injection or icterus. Mallampati class II. Cardiovascular:  Regular rate. No edema. Regular  rhythm.  Baxter:  Good aeration & clear to auscultation bilaterally. Normal work of breathing on room air. Abdomen: Soft. Normal bowel sounds. Nondistended.  Musculoskeletal:  Normal bulk and tone. Hand grip strength 5/5 bilaterally. No joint deformity or effusion appreciated. Neurological:  Cranial nerves 2-12 grossly in tact. No meningismus. Moving all 4 extremities equally.   6MWT 03/08/17:  Walked 3 laps / Baseline Sat 90% on Room Air / Nadir Sat 84% on Room Air   IMAGING CTA CHEST 02/23/17 (previously reviewed by me):  Previously reviewed by me. Small pericardial effusion. Mild apical predominant emphysematous changes. Approximately 2.6 cm right adrenal nodule appreciated. No obvious skeletal metastases. Right upper lobe central mass noted with evidence of what appears to be lymphangitic spread. Right  suprahilar soft tissue density suspicious for mass versus pathologically enlarged lymph nodes causing either extrinsic compression of airway or endobronchial invasion. No obvious Baxter arterial invasion. Unable to appreciate pathologic mediastinal or right hilar adenopathy with possible mass invasion. No pleural effusion or thickening appreciated. Linear opacity left lung base suggestive of atelectasis. Necrotic mass present within the lingula measuring up to 2.1 cm. Additional left upper lobe nodule measuring approximately 1 cm identified.  CARDIAC TTE (02/23/17):  LV normal in size with EF 65-70%. No regional wall motion abnormalities & grade 1 diastolic dysfunction. LA & RA normal in size. RV normal in size and function. Baxter artery systolic pressure 32 mmHg. No aortic stenosis or regurgitation. No mitral stenosis or regurgitation. No significant pulmonic regurgitation with poorly visualized valve. No tricuspid regurgitation. No pericardial effusion.   PATHOLOGY Right Upper Lobe Lavage Cytology 02/25/17:  Atypical Cells Present Right Upper Lobe Endobronchial Brushing 02/25/17:  Negative  for Malignancy  MICROBIOLOGY Blood Cultures x2 02/23/17:  Negative  Urine Culture 02/22/17:  Multiple species Urine Streptococcal Antigen 02/23/17:  Negative  Urine Legionella Antigen 02/23/17:  Negative  Quantitative BAL Culture RUL 02/25/17:  Normal respiratory flora     Assessment & Plan:  71 y.o. female recently seen by me in hospital for postobstructive pneumonia. Has what appears to be a right upper lobe mass. Mediastinum on CT suggestive of pathologic adenopathy versus mass invasion of the mediastinum and hilum. Previous bronchoscopy with lavage and endobronchial brushings were negative for malignancy. As such, we will need to proceed with bronchoscopy with endobronchial ultrasound exam and mediastinal lymph node sampling and staging for her lung mass. Her oxygen walk test today demonstrates a 2 liter per minute requirement with ambulation. I instructed the patient to notify my office if she had questions or concerns before her next appointment.  1. Postobstructive pneumonia: Status post treatment. Secondary to right upper lobe mass. 2. Right upper lobe mass: Planned for PET/CT by medical oncology. I am message Dr. Earlie Server regarding plan for endobronchial ultrasound-guided mediastinal lymph node biopsy and staging on 10/22. 3. COPD/asthma: Continuing Symbicort. Prescribing patient spacer to use with it. Instructed on proper oral hygiene. Checking full Baxter function testing. 4. Tobacco use disorder: Patient counseled for over 3 minutes on the need for complete and continued tobacco abstinence. 5. Acute hypoxic respiratory failure: Ordering a portable oxygen concentrator. Patient to continue using oxygen at 2 L/m with exertion. 6. 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 7. Health maintenance: Administering high-dose influenza vaccine today. 8. Follow-up: Return to clinic in 4 weeks or sooner if needed.  Sonia Baller Ashok Cordia, M.D. [facility] Pager:  [phone] After 7pm or if no response, call [phone] 10:10 AM 03/08/17

## 2017-03-08 ENCOUNTER — Encounter: Payer: Self-pay | Admitting: Pulmonary Disease

## 2017-03-08 ENCOUNTER — Ambulatory Visit (INDEPENDENT_AMBULATORY_CARE_PROVIDER_SITE_OTHER): Payer: Medicare Other | Admitting: Pulmonary Disease

## 2017-03-08 VITALS — BP 118/68 | HR 98 | Ht 60.0 in | Wt 122.0 lb

## 2017-03-08 DIAGNOSIS — F17200 Nicotine dependence, unspecified, uncomplicated: Secondary | ICD-10-CM

## 2017-03-08 DIAGNOSIS — J9622 Acute and chronic respiratory failure with hypercapnia: Secondary | ICD-10-CM

## 2017-03-08 DIAGNOSIS — R918 Other nonspecific abnormal finding of lung field: Secondary | ICD-10-CM

## 2017-03-08 DIAGNOSIS — Z23 Encounter for immunization: Secondary | ICD-10-CM

## 2017-03-08 DIAGNOSIS — J449 Chronic obstructive pulmonary disease, unspecified: Secondary | ICD-10-CM

## 2017-03-08 DIAGNOSIS — J189 Pneumonia, unspecified organism: Secondary | ICD-10-CM | POA: Diagnosis not present

## 2017-03-08 DIAGNOSIS — J381 Polyp of vocal cord and larynx: Secondary | ICD-10-CM

## 2017-03-08 DIAGNOSIS — F17210 Nicotine dependence, cigarettes, uncomplicated: Secondary | ICD-10-CM | POA: Diagnosis not present

## 2017-03-08 DIAGNOSIS — J9601 Acute respiratory failure with hypoxia: Secondary | ICD-10-CM

## 2017-03-08 DIAGNOSIS — J9621 Acute and chronic respiratory failure with hypoxia: Secondary | ICD-10-CM | POA: Insufficient documentation

## 2017-03-08 MED ORDER — IPRATROPIUM-ALBUTEROL 0.5-2.5 (3) MG/3ML IN SOLN: 3.0000 mL | Freq: Four times a day (QID) | RESPIRATORY_TRACT | 3 refills | Status: DC | PRN

## 2017-03-08 MED ORDER — SPACER/AERO CHAMBER MOUTHPIECE MISC: 1.0000 | 0 refills | Status: AC

## 2017-03-08 NOTE — Patient Instructions (Addendum)
   Continue using your Duonebs as needed.  You can also continue to use your Combivent Respimat as a rescue inhaler.   I'm giving you a spacer to use with your Symbicort inhaler.  Remember to remove any dentures or partials you have before you use your inhaler. Remember to brush your teeth & tongue after you use your inhaler as well as rinse, gargle & spit to keep from getting thrush in your mouth or on your tongue (a white film).   We are putting in for [facility] to evaluate you for a portable oxygen concentrator.  Use your oxygen at 2 L/m with exertion.  TESTS ORDERED: 1. Full PFTs before next appointment

## 2017-03-08 NOTE — Addendum Note (Signed)
Addended by: Della Goo C on: 03/08/2017 10:42 AM   Modules accepted: Orders

## 2017-03-11 ENCOUNTER — Encounter (HOSPITAL_COMMUNITY): Payer: Self-pay

## 2017-03-11 NOTE — Addendum Note (Signed)
Addended by: Della Goo C on: 03/11/2017 09:52 AM   Modules accepted: Orders

## 2017-03-13 ENCOUNTER — Encounter: Payer: Self-pay | Admitting: Pulmonary Disease

## 2017-03-13 ENCOUNTER — Ambulatory Visit (INDEPENDENT_AMBULATORY_CARE_PROVIDER_SITE_OTHER): Payer: Medicare Other | Admitting: Pulmonary Disease

## 2017-03-13 ENCOUNTER — Telehealth: Payer: Self-pay | Admitting: Pulmonary Disease

## 2017-03-13 VITALS — BP 124/74 | HR 103 | Ht 60.0 in | Wt 123.4 lb

## 2017-03-13 DIAGNOSIS — R221 Localized swelling, mass and lump, neck: Secondary | ICD-10-CM | POA: Diagnosis not present

## 2017-03-13 DIAGNOSIS — R918 Other nonspecific abnormal finding of lung field: Secondary | ICD-10-CM | POA: Diagnosis not present

## 2017-03-13 DIAGNOSIS — H538 Other visual disturbances: Secondary | ICD-10-CM | POA: Diagnosis not present

## 2017-03-13 NOTE — Addendum Note (Signed)
Addended by: Della Goo C on: 03/13/2017 05:09 PM   Modules accepted: Orders

## 2017-03-13 NOTE — Telephone Encounter (Signed)
Called pt to see if she had spoken with her PCP and she stated that she did not call as she felt that this was not related to that.  She is scheduled to see JN today at 3:30

## 2017-03-13 NOTE — Patient Instructions (Addendum)
   Continue using your nebulizer as you have been.  Let me know if you have any further swelling or new symptoms.  We are going to do a CT scan of your brain & neck to see if we can figure out why your left eye is blurry.  We are going to try to expedite your PET CT scan.  We will proceed with your bronchoscopy on Monday as planned.  Call me if you have questions.    TESTS ORDERED: 1. CT HEAD W/ & W/O CONTRAST ASAP 2. CTA HEAD/NECK W/ & W/O CONTRAST ASAP  3. PET CT ASAP

## 2017-03-13 NOTE — Telephone Encounter (Signed)
Called and spoke with pt and she stated that she has noticed some swelling in her neck area and to her lower face.  She stated that she is not having any swelling in her tongue and not having any breathing issues.  She stated that when she gets up in the morning her ear lobes are purple.  I advised her that she will need to contact her PCP for appt today.  Pt was concerned that this would affect her ebus on Monday and I advised her that we will address that at a later time, but that she needed to be seen for this by her PCP.  Will forward to JN to make him aware.

## 2017-03-13 NOTE — Progress Notes (Signed)
Subjective:    Patient ID: [name], female    DOB: August 15, 1945, 71 y.o.   MRN: [id number]  [facility].:  Acute visit for Throat Swelling with known Right Upper Lobe Mass, COPD/Asthma, Acute Hypoxic Respiratory Failure & Tobacco Use Disorder.  HPI Throat swelling:  She reports over the last 24-48 hours she has felt as though she has more edema in her upper chest, neck, earlobes, and arms. She denies any tongue swelling. No difficulty swallowing.  Right upper lobe mass: Plan for bronchoscopy with endobronchial ultrasound on Monday. Has seen medical oncology. PET CT scan has been ordered but is not scheduled yet.  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 Plan for eventual resection. ENT to contact patient at the end of the month.  COPD/asthma: Awaiting full pulmonary function testing. Given spacer to use with Symbicort at last appointment. Patient reports she has not resumed her Symbicort but rather has been using her nebulizer treatments every 12 hours. Denies any significant wheezing. No new dyspnea.  Acute hypoxic respiratory failure: Patient continued to have a 2 L/m oxygen requirement. Prescribed oxygen with exertion. Continuing to use oxygen as prescribed.  Postobstructive pneumonia: Secondary to right upper lobe mass. Status post course of systemic antibiotics as well as doxycycline. Reports no significant cough.  Tobacco use disorder: At last appointment patient had continued to abstain from tobacco since being admitted to hospital.  Patient continues to abstain from tobacco.  Review of Systems No chest pain or pressure. No lower extremity edema. No headache. Patient has experienced blurry vision in her left eye which is new. Denies any focal weakness, numbness, or tingling. Denies any subjective fever, chills, or sweats.  Allergies  Allergen Reactions  . Penicillins Hives    Has patient had a PCN reaction causing immediate rash, facial/tongue/throat swelling, SOB or lightheadedness with  hypotension: no Has patient had a PCN reaction causing severe rash involving mucus membranes or skin necrosis: no Has patient had a PCN reaction that required hospitalization: yes Has patient had a PCN reaction occurring within the last 10 years: no If all of the above answers are "NO", then may proceed with Cephalosporin use.   . Tiotropium Bromide Monohydrate Other (See Comments)    EYE PAIN KIDNEY FUNCTION SLOWED DOWN    Current Outpatient Prescriptions on File Prior to Visit  Medication Sig Dispense Refill  . acetaminophen (TYLENOL) 500 MG tablet Take 500 mg by mouth every 8 (eight) hours as needed for mild pain or headache.    Marland Kitchen aspirin 81 MG tablet Take 81 mg by mouth daily.    . citalopram (CELEXA) 20 MG tablet Take 10 mg by mouth daily.     . Guaifenesin (MUCINEX MAXIMUM STRENGTH) 1200 MG TB12 Take 1,200 mg by mouth 2 (two) times daily.    Marland Kitchen guaiFENesin (MUCINEX) 600 MG 12 hr tablet Take 2 tablets (1,200 mg total) by mouth 2 (two) times daily. 60 tablet 0  . ipratropium-albuterol (DUONEB) 0.5-2.5 (3) MG/3ML SOLN Take 3 mLs by nebulization every 6 (six) hours as needed. (Patient taking differently: Take 3 mLs by nebulization every 6 (six) hours as needed (shortness of breath). ) 120 mL 3  . losartan (COZAAR) 50 MG tablet Take 50 mg by mouth daily.  0  . omeprazole (PRILOSEC) 20 MG capsule Take 20 mg by mouth daily as needed (indigestion).     . polyethylene glycol (MIRALAX / GLYCOLAX) packet Take 17 g by mouth daily. (Patient taking differently: Take 17 g by mouth daily  as needed for mild constipation. ) 14 each 0  . simvastatin (ZOCOR) 40 MG tablet Take 40 mg by mouth every evening.    Marland Kitchen Spacer/Aero Chamber Mouthpiece MISC 1 Device by Does not apply route as directed. 1 each 0   No current facility-administered medications on file prior to visit.     Past Medical History:  Diagnosis Date  . Arthritis   . Asthma   . COPD (chronic obstructive pulmonary disease) ([place])   .  Depression   . Diabetes mellitus    diet controlled  . History of hiatal hernia   . Hyperlipemia   . Hypertension   . Pneumonia     Past Surgical History:  Procedure Laterality Date  . CARDIAC CATHETERIZATION  1999  . CARPAL TUNNEL RELEASE  2/13   right-GSC  . CARPAL TUNNEL RELEASE  08/15/2011   Procedure: CARPAL TUNNEL RELEASE;  Surgeon: Linna Hoff, MD;  Location: [facility];  Service: Orthopedics;  Laterality: Left;  . CATARACT EXTRACTION    . CERVICAL FUSION  2002  . COLONOSCOPY    . TONSILLECTOMY    . VIDEO BRONCHOSCOPY Bilateral 02/25/2017   Procedure: VIDEO BRONCHOSCOPY WITHOUT FLUORO;  Surgeon: Javier Glazier, MD;  Location: [facility];  Service: Cardiopulmonary;  Laterality: Bilateral;    Family History  Problem Relation Age of Onset  . Diabetes Mother   . Colon cancer Father   . Cerebral palsy Brother   . Breast cancer Paternal Aunt   . Breast cancer Cousin   . Prostate cancer Paternal Uncle   . Bone cancer Maternal Aunt     Social History   Social History  . Marital status: Married    Spouse name: N/A  . Number of children: N/A  . Years of education: N/A   Social History Main Topics  . Smoking status: Former Smoker    Packs/day: 1.00    Years: 53.00    Start date: 12/22/1962    Quit date: 02/25/2017  . Smokeless tobacco: Never Used     Comment: Peak rate 1.5ppd - quit at most 6 months  . Alcohol use No  . Drug use: No  . Sexual activity: Not Currently   Other Topics Concern  . Not on file   Social History Narrative   [facility] (02/23/17):   Originally from [place]. Previously has lived in [place] as well as [place]. Previously worked with a Engineer, structural and in Engineer, materials. Remote exposure to a parrot. Currently lives with her husband. Retired.      Objective:   Physical Exam BP 124/74 (BP Location: Left Arm, Cuff Size: Normal)   Pulse (!) 103   Ht 5' (1.524 m)   Wt 123 lb 6.4 oz (56 kg)   SpO2  93%   BMI 24.10 kg/m   General:  Awake. No distress. Accompanied by husband today. Integument:  Warm. Dry. No rash. Extremities:  No cyanosis or clubbing.  HEENT:  Moist mucus membranes. Mallampati class I. No oral ulcers Cardiovascular:  Regular rate. No edema. No JVD or venous distention. No hepatojugular reflux.  Pulmonary:  Clear bilaterally with auscultation. Normal work of breathing on room air. Abdomen: Soft. Normal bowel sounds. Nondistended. Nontender. Musculoskeletal:  Normal bulk and tone. Hand grip, biceps, deltoids, and hip strength 5/5 bilaterally. No joint deformity or effusion appreciated. No appreciable swelling of her neck, face, or upper extremities on my exam. Neurological:  Cranial nerves 2-12 grossly in tact. No meningismus. No visual field  defect.   6MWT 03/08/17:  Walked 3 laps / Baseline Sat 90% on Room Air / Nadir Sat 84% on Room Air   IMAGING CTA CHEST 02/23/17 (previously reviewed by me):  Previously reviewed by me. Small pericardial effusion. Mild apical predominant emphysematous changes. Approximately 2.6 cm right adrenal nodule appreciated. No obvious skeletal metastases. Right upper lobe central mass noted with evidence of what appears to be lymphangitic spread. Right suprahilar soft tissue density suspicious for mass versus pathologically enlarged lymph nodes causing either extrinsic compression of airway or endobronchial invasion. No obvious pulmonary arterial invasion. Unable to appreciate pathologic mediastinal or right hilar adenopathy with possible mass invasion. No pleural effusion or thickening appreciated. Linear opacity left lung base suggestive of atelectasis. Necrotic mass present within the lingula measuring up to 2.1 cm. Additional left upper lobe nodule measuring approximately 1 cm identified. Radiology noted mass effect on the IVC with possible invasion as well as a small pericardial effusion.  CARDIAC TTE (02/23/17):  LV normal in size with EF  65-70%. No regional wall motion abnormalities & grade 1 diastolic dysfunction. LA & RA normal in size. RV normal in size and function. Pulmonary artery systolic pressure 32 mmHg. No aortic stenosis or regurgitation. No mitral stenosis or regurgitation. No significant pulmonic regurgitation with poorly visualized valve. No tricuspid regurgitation. No pericardial effusion.   PATHOLOGY Right Upper Lobe Lavage Cytology 02/25/17:  Atypical Cells Present Right Upper Lobe Endobronchial Brushing 02/25/17:  Negative for Malignancy  MICROBIOLOGY Blood Cultures x2 02/23/17:  Negative  Urine Culture 02/22/17:  Multiple species Urine Streptococcal Antigen 02/23/17:  Negative  Urine Legionella Antigen 02/23/17:  Negative  Quantitative BAL Culture RUL 02/25/17:  Normal respiratory flora     Assessment & Plan:  71 y.o. female with right upper lobe mass. I am significant only concerned about the newly blurry vision in her left eye. She has no other physical exam findings on her neurologic exam today. She has no appreciable swelling of her neck, face, or upper extremities. Reviewing her previous CT angiogram if anything I would expect an effect on the inferior vena cava. At this time I am going to attempt to expedite her PET/CT scan and also perform imaging of her head and neck. Patient does have a metal plate in her neck and has been told after placement of the plate that she can never have an MRI. I instructed the patient and her husband to contact me if she had any new problems or concerns before her procedure.  1. Throat/neck swelling:  Not appreciated on physical exam. Continued monitoring. 2. Blurry vision left eye: Checking CT and CT angiogram of the head and neck with and without contrast ASAP. 3. Right upper lobe mass: Attempting to expedite PET/CT scan. Continuing with planned bronchoscopy on Monday with endobronchial ultrasound. 4. COPD/asthma: Continuing nebulizer therapies every 12 hours. 5. Acute hypoxic  respiratory failure: Continuing on oxygen as previously prescribed. Previously ordered portable concentrator. 6. 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 7. Tobacco use disorder: Patient encouraged to continue to abstain from tobacco. 8. Health maintenance: Status post influenza vaccine at last appointment on 03/08/17. 9. Follow-up: Return to clinic in November as scheduled.  I have spent a total of 27 minutes of time today evaluating the patient, reviewing the patient's electronic medical record, and discussing the plan of care with patient and husband with more than 50% of that time spent in face-to-face contact during her visit today.  Sonia Baller Ashok Cordia, M.D. [facility] Pager:  [phone] After 7pm or if no response, call [phone] 3:50 PM 03/13/17

## 2017-03-13 NOTE — Telephone Encounter (Signed)
I have space to see her today as well but she absolutely needs to be seen. Thanks.

## 2017-03-14 ENCOUNTER — Telehealth: Payer: Self-pay | Admitting: Pulmonary Disease

## 2017-03-14 NOTE — Telephone Encounter (Signed)
Spoke with Erin at [facility]. She was concerned about the multiple CT orders on the patient. She stated that the CT angio w/wo includes the head, therefore she will cancel that test. She didn't want the patient to have to pay twice for the same test. Advised that that was ok.   Nothing else needed at time of call.

## 2017-03-15 ENCOUNTER — Other Ambulatory Visit (HOSPITAL_BASED_OUTPATIENT_CLINIC_OR_DEPARTMENT_OTHER): Payer: Medicare Other

## 2017-03-15 ENCOUNTER — Ambulatory Visit (HOSPITAL_BASED_OUTPATIENT_CLINIC_OR_DEPARTMENT_OTHER)
Admission: RE | Admit: 2017-03-15 | Discharge: 2017-03-15 | Disposition: A | Discharge status: Home / Self Care | Payer: Medicare Other | Source: Ambulatory Visit | Attending: Pulmonary Disease | Admitting: Pulmonary Disease

## 2017-03-15 ENCOUNTER — Encounter (HOSPITAL_BASED_OUTPATIENT_CLINIC_OR_DEPARTMENT_OTHER): Payer: Self-pay

## 2017-03-15 ENCOUNTER — Ambulatory Visit (HOSPITAL_BASED_OUTPATIENT_CLINIC_OR_DEPARTMENT_OTHER): Admission: RE | Admit: 2017-03-15 | Payer: Medicare Other | Source: Ambulatory Visit

## 2017-03-15 DIAGNOSIS — I6521 Occlusion and stenosis of right carotid artery: Secondary | ICD-10-CM | POA: Diagnosis not present

## 2017-03-15 DIAGNOSIS — M799 Soft tissue disorder, unspecified: Secondary | ICD-10-CM | POA: Insufficient documentation

## 2017-03-15 DIAGNOSIS — I700 Atherosclerosis of aorta: Secondary | ICD-10-CM | POA: Insufficient documentation

## 2017-03-15 DIAGNOSIS — I708 Atherosclerosis of other arteries: Secondary | ICD-10-CM | POA: Diagnosis not present

## 2017-03-15 DIAGNOSIS — R221 Localized swelling, mass and lump, neck: Secondary | ICD-10-CM | POA: Insufficient documentation

## 2017-03-15 DIAGNOSIS — J9819 Other pulmonary collapse: Secondary | ICD-10-CM | POA: Diagnosis not present

## 2017-03-15 DIAGNOSIS — H538 Other visual disturbances: Secondary | ICD-10-CM

## 2017-03-15 MED ORDER — IOPAMIDOL (ISOVUE-370) INJECTION 76%
100.0000 mL | Freq: Once | INTRAVENOUS | Status: AC | PRN
  Administered 2017-03-15: 100 mL via INTRAVENOUS

## 2017-03-17 NOTE — Anesthesia Preprocedure Evaluation (Addendum)
Anesthesia Evaluation  Patient identified by MRN, date of birth, ID band Patient awake    Reviewed: Allergy & Precautions, NPO status , Patient's Chart, lab work & pertinent test results  Airway Mallampati: II   Neck ROM: Full    Dental  (+) Edentulous Upper, Edentulous Lower   Pulmonary asthma , COPD, former smoker,  Pain, cough, SOB, mass on chest x ray   breath sounds clear to auscultation       Cardiovascular hypertension,  Rhythm:Regular Rate:Normal     Neuro/Psych    GI/Hepatic negative GI ROS, Neg liver ROS,   Endo/Other  diabetes  Renal/GU negative Renal ROS     Musculoskeletal  (+) Arthritis ,   Abdominal   Peds  Hematology negative hematology ROS (+)   Anesthesia Other Findings   Reproductive/Obstetrics                            Anesthesia Physical Anesthesia Plan  ASA: III  Anesthesia Plan: General   Post-op Pain Management:    Induction: Intravenous  PONV Risk Score and Plan: 4 or greater and Ondansetron, Dexamethasone, Midazolam, Scopolamine patch - Pre-op, Propofol infusion and Treatment may vary due to age or medical condition  Airway Management Planned: Oral ETT  Additional Equipment:   Intra-op Plan:   Post-operative Plan: Extubation in OR and Possible Post-op intubation/ventilation  Informed Consent: I have reviewed the patients History and Physical, chart, labs and discussed the procedure including the risks, benefits and alternatives for the proposed anesthesia with the patient or authorized representative who has indicated his/her understanding and acceptance.   Dental advisory given  Plan Discussed with:   Anesthesia Plan Comments:         Anesthesia Quick Evaluation

## 2017-03-18 ENCOUNTER — Ambulatory Visit (HOSPITAL_COMMUNITY)
Admission: RE | Admit: 2017-03-18 | Discharge: 2017-03-18 | Disposition: A | Discharge status: Home / Self Care | Payer: Medicare Other | Source: Ambulatory Visit | Attending: Pulmonary Disease | Admitting: Pulmonary Disease

## 2017-03-18 ENCOUNTER — Ambulatory Visit (HOSPITAL_COMMUNITY): Payer: Medicare Other

## 2017-03-18 ENCOUNTER — Encounter (HOSPITAL_COMMUNITY)
Admission: RE | Disposition: A | Discharge status: Home / Self Care | Payer: Self-pay | Source: Ambulatory Visit | Attending: Pulmonary Disease

## 2017-03-18 ENCOUNTER — Ambulatory Visit (HOSPITAL_COMMUNITY): Payer: Medicare Other | Admitting: Anesthesiology

## 2017-03-18 ENCOUNTER — Other Ambulatory Visit: Payer: Self-pay | Admitting: Internal Medicine

## 2017-03-18 ENCOUNTER — Telehealth: Payer: Self-pay | Admitting: Internal Medicine

## 2017-03-18 ENCOUNTER — Encounter (HOSPITAL_COMMUNITY): Payer: Self-pay | Admitting: Certified Registered Nurse Anesthetist

## 2017-03-18 ENCOUNTER — Telehealth: Payer: Self-pay | Admitting: *Deleted

## 2017-03-18 DIAGNOSIS — Z79899 Other long term (current) drug therapy: Secondary | ICD-10-CM | POA: Insufficient documentation

## 2017-03-18 DIAGNOSIS — J449 Chronic obstructive pulmonary disease, unspecified: Secondary | ICD-10-CM | POA: Insufficient documentation

## 2017-03-18 DIAGNOSIS — J381 Polyp of vocal cord and larynx: Secondary | ICD-10-CM | POA: Diagnosis not present

## 2017-03-18 DIAGNOSIS — E119 Type 2 diabetes mellitus without complications: Secondary | ICD-10-CM | POA: Insufficient documentation

## 2017-03-18 DIAGNOSIS — J9601 Acute respiratory failure with hypoxia: Secondary | ICD-10-CM | POA: Diagnosis not present

## 2017-03-18 DIAGNOSIS — Z7982 Long term (current) use of aspirin: Secondary | ICD-10-CM | POA: Insufficient documentation

## 2017-03-18 DIAGNOSIS — R918 Other nonspecific abnormal finding of lung field: Secondary | ICD-10-CM

## 2017-03-18 DIAGNOSIS — E785 Hyperlipidemia, unspecified: Secondary | ICD-10-CM | POA: Diagnosis not present

## 2017-03-18 DIAGNOSIS — J189 Pneumonia, unspecified organism: Secondary | ICD-10-CM

## 2017-03-18 DIAGNOSIS — H538 Other visual disturbances: Secondary | ICD-10-CM | POA: Insufficient documentation

## 2017-03-18 DIAGNOSIS — F329 Major depressive disorder, single episode, unspecified: Secondary | ICD-10-CM | POA: Diagnosis not present

## 2017-03-18 DIAGNOSIS — Z87891 Personal history of nicotine dependence: Secondary | ICD-10-CM | POA: Diagnosis not present

## 2017-03-18 DIAGNOSIS — Z9981 Dependence on supplemental oxygen: Secondary | ICD-10-CM | POA: Diagnosis not present

## 2017-03-18 DIAGNOSIS — I10 Essential (primary) hypertension: Secondary | ICD-10-CM | POA: Diagnosis not present

## 2017-03-18 HISTORY — DX: Personal history of other diseases of the digestive system: Z87.19

## 2017-03-18 HISTORY — DX: Pneumonia, unspecified organism: J18.9

## 2017-03-18 HISTORY — PX: ENDOBRONCHIAL ULTRASOUND: SHX5096

## 2017-03-18 LAB — GLUCOSE, CAPILLARY: GLUCOSE-CAPILLARY: 193 mg/dL — AB (ref 65–99)

## 2017-03-18 SURGERY — ENDOBRONCHIAL ULTRASOUND (EBUS)
Anesthesia: General | Laterality: Bilateral

## 2017-03-18 MED ORDER — DEXAMETHASONE SODIUM PHOSPHATE 4 MG/ML IJ SOLN
INTRAMUSCULAR | Status: DC | PRN
  Administered 2017-03-18: 4 mg via INTRAVENOUS

## 2017-03-18 MED ORDER — ONDANSETRON HCL 4 MG/2ML IJ SOLN
INTRAMUSCULAR | Status: DC | PRN
  Administered 2017-03-18: 4 mg via INTRAVENOUS

## 2017-03-18 MED ORDER — DEXAMETHASONE SODIUM PHOSPHATE 10 MG/ML IJ SOLN
INTRAMUSCULAR | Status: AC
  Filled 2017-03-18: qty 1

## 2017-03-18 MED ORDER — ESMOLOL HCL 100 MG/10ML IV SOLN
INTRAVENOUS | Status: AC
  Filled 2017-03-18: qty 10

## 2017-03-18 MED ORDER — ROCURONIUM BROMIDE 50 MG/5ML IV SOSY
PREFILLED_SYRINGE | INTRAVENOUS | Status: AC
  Filled 2017-03-18: qty 5

## 2017-03-18 MED ORDER — PROPOFOL 10 MG/ML IV BOLUS
INTRAVENOUS | Status: DC | PRN
  Administered 2017-03-18: 90 mg via INTRAVENOUS

## 2017-03-18 MED ORDER — FENTANYL CITRATE (PF) 100 MCG/2ML IJ SOLN
INTRAMUSCULAR | Status: AC
  Filled 2017-03-18: qty 2

## 2017-03-18 MED ORDER — LACTATED RINGERS IV SOLN
INTRAVENOUS | Status: DC
  Administered 2017-03-18: 1000 mL via INTRAVENOUS
  Administered 2017-03-18: 10:00:00 via INTRAVENOUS

## 2017-03-18 MED ORDER — LABETALOL HCL 5 MG/ML IV SOLN
INTRAVENOUS | Status: DC | PRN
  Administered 2017-03-18: 5 mg via INTRAVENOUS

## 2017-03-18 MED ORDER — ESMOLOL HCL 100 MG/10ML IV SOLN
INTRAVENOUS | Status: DC | PRN
  Administered 2017-03-18 (×2): 20 mg via INTRAVENOUS

## 2017-03-18 MED ORDER — PROPOFOL 10 MG/ML IV BOLUS
INTRAVENOUS | Status: AC
  Filled 2017-03-18: qty 20

## 2017-03-18 MED ORDER — SUGAMMADEX SODIUM 200 MG/2ML IV SOLN
INTRAVENOUS | Status: AC
  Filled 2017-03-18: qty 2

## 2017-03-18 MED ORDER — SUGAMMADEX SODIUM 200 MG/2ML IV SOLN
INTRAVENOUS | Status: DC | PRN
  Administered 2017-03-18: 150 mg via INTRAVENOUS

## 2017-03-18 MED ORDER — ROCURONIUM BROMIDE 50 MG/5ML IV SOSY
PREFILLED_SYRINGE | INTRAVENOUS | Status: DC | PRN
  Administered 2017-03-18: 30 mg via INTRAVENOUS

## 2017-03-18 MED ORDER — LIDOCAINE 2% (20 MG/ML) 5 ML SYRINGE
INTRAMUSCULAR | Status: DC | PRN
  Administered 2017-03-18: 40 mg via INTRAVENOUS
  Administered 2017-03-18: 60 mg via INTRAVENOUS

## 2017-03-18 MED ORDER — FENTANYL CITRATE (PF) 100 MCG/2ML IJ SOLN
INTRAMUSCULAR | Status: DC | PRN
  Administered 2017-03-18 (×2): 50 ug via INTRAVENOUS

## 2017-03-18 MED ORDER — ONDANSETRON HCL 4 MG/2ML IJ SOLN
INTRAMUSCULAR | Status: AC
  Filled 2017-03-18: qty 2

## 2017-03-18 MED ORDER — PHENYLEPHRINE 40 MCG/ML (10ML) SYRINGE FOR IV PUSH (FOR BLOOD PRESSURE SUPPORT)
PREFILLED_SYRINGE | INTRAVENOUS | Status: DC | PRN
  Administered 2017-03-18: 40 ug via INTRAVENOUS
  Administered 2017-03-18 (×3): 80 ug via INTRAVENOUS
  Administered 2017-03-18 (×2): 40 ug via INTRAVENOUS
  Administered 2017-03-18: 120 ug via INTRAVENOUS

## 2017-03-18 MED ORDER — LABETALOL HCL 5 MG/ML IV SOLN
INTRAVENOUS | Status: AC
  Filled 2017-03-18: qty 4

## 2017-03-18 MED ORDER — LIDOCAINE 2% (20 MG/ML) 5 ML SYRINGE
INTRAMUSCULAR | Status: AC
  Filled 2017-03-18: qty 5

## 2017-03-18 NOTE — Discharge Instructions (Signed)
Seek immediate medical attention for any chest discomfort or difficulty breathing. Seek immediate medical attention for any cough producing a persistently bloody mucus or if the blood contained in your mucus does not resolve. Notify Dr. Ashok Cordia if you develop any persistent fever or cough producing a discolored mucus in the next few days. A low-grade fever today is to be expected.

## 2017-03-18 NOTE — Telephone Encounter (Signed)
Oncology Nurse Navigator Documentation  Oncology Nurse Navigator Flowsheets 03/18/2017  Navigator Location [facility]  Navigator Encounter Type Telephone/I called Ms. [name] to follow up with her. She is recovering from biopsy today.  I listened as she explained. I asked her about her Ct head and she stated that has been done. I do not see that is completed but patient states it is. I notified Dr. Julien Nordmann patient does not have a follow up. He asked for me to notify scheduling to set patient up to be seen next week with him or [name]. I notified scheduling and stated only to set patient up with [name] if Dr. Julien Nordmann is in the office.   Telephone Outgoing Call  Treatment Phase Abnormal Scans  Barriers/Navigation Needs Coordination of Care  Interventions Coordination of Care  Coordination of Care Other  Acuity Level 2  Time Spent with Patient 30

## 2017-03-18 NOTE — Interval H&P Note (Signed)
History and Physical Interval Note:  03/18/2017 8:34 AM  [name]  has presented today for surgery, with the diagnosis of LUNG MASS  The various methods of treatment have been discussed with the patient and family. After consideration of risks, benefits and other options for treatment, the patient has consented to  Procedure(s): ENDOBRONCHIAL ULTRASOUND (Bilateral) as a surgical intervention .  The patient's history has been reviewed, patient examined, no change in status, stable for surgery.  I have reviewed the patient's chart and labs.  Questions were answered to the patient's satisfaction.     Tera Partridge

## 2017-03-18 NOTE — Transfer of Care (Signed)
Immediate Anesthesia Transfer of Care Note  Patient: [name]  Procedure(s) Performed: Procedure(s): ENDOBRONCHIAL ULTRASOUND (Bilateral)  Patient Location: PACU  Anesthesia Type:General  Level of Consciousness: Patient easily awoken, sedated, comfortable, cooperative, following commands, responds to stimulation.   Airway & Oxygen Therapy: Patient spontaneously breathing, ventilating well, oxygen via simple oxygen mask.  Post-op Assessment: Report given to PACU RN, vital signs reviewed and stable, moving all extremities.   Post vital signs: Reviewed and stable.  Complications: No apparent anesthesia complications  Last Vitals:  Vitals:   03/18/17 0802  BP: (!) 130/59  Pulse: (!) 118  Resp: (!) 24  Temp: 36.5 C  SpO2: 96%    Last Pain:  Vitals:   03/18/17 0802  TempSrc: Oral         Complications: No apparent anesthesia complications

## 2017-03-18 NOTE — Telephone Encounter (Signed)
Scheduled appt per 10/11 los - per melanie in rad onc to let patient know when she calls the patient to let them know about Dr. Ida Rogue appt.

## 2017-03-18 NOTE — Anesthesia Procedure Notes (Signed)
Procedure Name: Intubation Date/Time: 03/18/2017 8:43 AM Performed by: Deliah Boston Pre-anesthesia Checklist: Patient identified, Emergency Drugs available, Suction available and Patient being monitored Patient Re-evaluated:Patient Re-evaluated prior to induction Oxygen Delivery Method: Circle system utilized Preoxygenation: Pre-oxygenation with 100% oxygen Induction Type: IV induction Ventilation: Mask ventilation without difficulty Laryngoscope Size: Mac and 3 Grade View: Grade I Tube type: Oral Tube size: 8.5 mm Number of attempts: 1 Airway Equipment and Method: Oral airway Placement Confirmation: ETT inserted through vocal cords under direct vision,  positive ETCO2 and breath sounds checked- equal and bilateral Secured at: 19 cm Tube secured with: Tape Dental Injury: Teeth and Oropharynx as per pre-operative assessment

## 2017-03-18 NOTE — Procedures (Signed)
Video Bronchoscopy Procedure Note  Pre-Procedure Diagnoses: 1.  Right Upper Lobe Lung Mass  Post-Procedure Diagnoses: 1.  Right Upper Lobe Lung Mass  Procedures Performed: 1. Bronchoscopy with Airway Inspection 2. Endobronchial Ultrasound with Needle Aspiration  Consent:  Informed consent was obtained from the patient after discussing the risks and benefits of the procedure including bleeding, infection, pneumothorax, medication allergy, vocal chord injury, and potentially death.  Medications Administered During Procedure:  Please refer to documentation by CRNA.  Pre-Procedure Physical Exam: General:  No acute distress. Awake. Alert. ASA Class 2. HEENT:  Moist mucus membranes. No oral ulcers. Mallampati Class 1. Cardiovascular:  Regular rate. No edema. No appreciable JVD. Pulmonary:  Clear to auscultation with good aeration bilaterally. Normal work of breathing. Abdomen:  Soft. Nontender. Nondistended. Normal bowel sounds. Musculoskeletal:  Normal bulk and tone. Normal neck flexion & extension. Neurological:  Oriented to person, place, and time. Moving all 4 extremities equally.  Description of Procedure: Patient was brought back to the endoscopy procedure room.  A time out was performed to identify the correct patient and procedure.  Patient was laid recumbent and general sedation was administered by CRNA.  Patient was subsequently endotracheally intubated.  Flexible bronchoscope was then inserted into the endotracheal tube.  Limited airway inspeciton was performed finding a scant, thin, clear secretions. Mucosal irregularity with previously noted narrowing of right upper lobe bronchus at the ostomy unchanged compared with previous bronchoscopy.  The flexible bronchoscope was then removed from the patient's airways after suctioning of the remaining secretions.  The endobronchial ultrasound scope was then inserted into the endotracheal tube with ease.  Mass and compressed lung was  appreciated at station 11R and 4R but could not discern between lung and mass. Mass appeared very vascular. A 5 mm lymph node was appreciated at level 4L but this was adjacent aorta and unable to be biopsied. An enlarged lymph node at level 7 was identified with endobronchial ultrasound.  Under direct visualization with the ultrasound a total of 7 passes were performed at level 7.  Hemostasis was directly visualized.  The remaining secretions were suctioned from the patient's airways and the endobronchial ultrasound scope was removed.   Blood Loss:  Less than 10 cc.  Complications:  None.  EBUS Procedure: Level 4R:  Mass and folded lung / No biopsy performed  Level 4L:  0.5 cm lymph node / No biopsy performed Level 7:  1.5 cm lymph node / biopsy performed / ROSE / passes 1 - 3 for cytology & cell block / passes 4-7 for cell block only  Post Procedure Stat Portable CXR:  Ordered and pending.  Post Procedure Instructions: Personally spoke with the patient's husband relaying the preliminary results of the procedure.  Instructed him that the patient is not to operate a car for 24 hours.  The patient should seek immediate medical attention if there is any persistent or progressive hemoptysis, difficulty breathing, or chest pain/discomfort.  The patient should also notify me immediately or seek medical attention for any purulent sputum production or fever occuring today or in the coming days.  The patient will be contacted by me once the final results of the studies are available.  The patient should call my office if they have any questions.

## 2017-03-18 NOTE — Anesthesia Postprocedure Evaluation (Signed)
Anesthesia Post Note  Patient: [name]  Procedure(s) Performed: ENDOBRONCHIAL ULTRASOUND (Bilateral )     Patient location during evaluation: Endoscopy Anesthesia Type: General Level of consciousness: awake and sedated Pain management: pain level controlled Vital Signs Assessment: post-procedure vital signs reviewed and stable Respiratory status: spontaneous breathing, nonlabored ventilation, respiratory function stable and patient connected to nasal cannula oxygen Cardiovascular status: blood pressure returned to baseline and stable Postop Assessment: no apparent nausea or vomiting Anesthetic complications: no    Last Vitals:  Vitals:   03/18/17 0950 03/18/17 1000  BP: (!) 119/55 (!) 100/57  Pulse: 89 89  Resp: (!) 28 (!) 28  Temp:    SpO2: 98% 94%    Last Pain:  Vitals:   03/18/17 0948  TempSrc: Oral                 Trudee Chirino,JAMES TERRILL

## 2017-03-18 NOTE — H&P (View-Only) (Signed)
 Subjective:    Patient ID: [name], female    DOB: 08/06/1945, 71 y.o.   MRN: [id number]  C.C.:  Acute visit for Throat Swelling with known Right Upper Lobe Mass, COPD/Asthma, Acute Hypoxic Respiratory Failure & Tobacco Use Disorder.  HPI Throat swelling:  She reports over the last 24-48 hours she has felt as though she has more edema in her upper chest, neck, earlobes, and arms. She denies any tongue swelling. No difficulty swallowing.  Right upper lobe mass: Plan for bronchoscopy with endobronchial ultrasound on Monday. Has seen medical oncology. PET CT scan has been ordered but is not scheduled yet.  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 Plan for eventual resection. ENT to contact patient at the end of the month.  COPD/asthma: Awaiting full pulmonary function testing. Given spacer to use with Symbicort at last appointment. Patient reports she has not resumed her Symbicort but rather has been using her nebulizer treatments every 12 hours. Denies any significant wheezing. No new dyspnea.  Acute hypoxic respiratory failure: Patient continued to have a 2 L/m oxygen requirement. Prescribed oxygen with exertion. Continuing to use oxygen as prescribed.  Postobstructive pneumonia: Secondary to right upper lobe mass. Status post course of systemic antibiotics as well as doxycycline. Reports no significant cough.  Tobacco use disorder: At last appointment patient had continued to abstain from tobacco since being admitted to hospital.  Patient continues to abstain from tobacco.  Review of Systems No chest pain or pressure. No lower extremity edema. No headache. Patient has experienced blurry vision in her left eye which is new. Denies any focal weakness, numbness, or tingling. Denies any subjective fever, chills, or sweats.  Allergies  Allergen Reactions  . Penicillins Hives    Has patient had a PCN reaction causing immediate rash, facial/tongue/throat swelling, SOB or lightheadedness with  hypotension: no Has patient had a PCN reaction causing severe rash involving mucus membranes or skin necrosis: no Has patient had a PCN reaction that required hospitalization: yes Has patient had a PCN reaction occurring within the last 10 years: no If all of the above answers are "NO", then may proceed with Cephalosporin use.   . Tiotropium Bromide Monohydrate Other (See Comments)    EYE PAIN KIDNEY FUNCTION SLOWED DOWN    Current Outpatient Prescriptions on File Prior to Visit  Medication Sig Dispense Refill  . acetaminophen (TYLENOL) 500 MG tablet Take 500 mg by mouth every 8 (eight) hours as needed for mild pain or headache.    . aspirin 81 MG tablet Take 81 mg by mouth daily.    . citalopram (CELEXA) 20 MG tablet Take 10 mg by mouth daily.     . Guaifenesin (MUCINEX MAXIMUM STRENGTH) 1200 MG TB12 Take 1,200 mg by mouth 2 (two) times daily.    . guaiFENesin (MUCINEX) 600 MG 12 hr tablet Take 2 tablets (1,200 mg total) by mouth 2 (two) times daily. 60 tablet 0  . ipratropium-albuterol (DUONEB) 0.5-2.5 (3) MG/3ML SOLN Take 3 mLs by nebulization every 6 (six) hours as needed. (Patient taking differently: Take 3 mLs by nebulization every 6 (six) hours as needed (shortness of breath). ) 120 mL 3  . losartan (COZAAR) 50 MG tablet Take 50 mg by mouth daily.  0  . omeprazole (PRILOSEC) 20 MG capsule Take 20 mg by mouth daily as needed (indigestion).     . polyethylene glycol (MIRALAX / GLYCOLAX) packet Take 17 g by mouth daily. (Patient taking differently: Take 17 g by mouth daily   as needed for mild constipation. ) 14 each 0  . simvastatin (ZOCOR) 40 MG tablet Take 40 mg by mouth every evening.    . Spacer/Aero Chamber Mouthpiece MISC 1 Device by Does not apply route as directed. 1 each 0   No current facility-administered medications on file prior to visit.     Past Medical History:  Diagnosis Date  . Arthritis   . Asthma   . COPD (chronic obstructive pulmonary disease) (HCC)   .  Depression   . Diabetes mellitus    diet controlled  . History of hiatal hernia   . Hyperlipemia   . Hypertension   . Pneumonia     Past Surgical History:  Procedure Laterality Date  . CARDIAC CATHETERIZATION  1999  . CARPAL TUNNEL RELEASE  2/13   right-GSC  . CARPAL TUNNEL RELEASE  08/15/2011   Procedure: CARPAL TUNNEL RELEASE;  Surgeon: Fred W Ortmann, MD;  Location: [facility];  Service: Orthopedics;  Laterality: Left;  . CATARACT EXTRACTION    . CERVICAL FUSION  2002  . COLONOSCOPY    . TONSILLECTOMY    . VIDEO BRONCHOSCOPY Bilateral 02/25/2017   Procedure: VIDEO BRONCHOSCOPY WITHOUT FLUORO;  Surgeon: Nestor, Jennings E, MD;  Location: [facility];  Service: Cardiopulmonary;  Laterality: Bilateral;    Family History  Problem Relation Age of Onset  . Diabetes Mother   . Colon cancer Father   . Cerebral palsy Brother   . Breast cancer Paternal Aunt   . Breast cancer Cousin   . Prostate cancer Paternal Uncle   . Bone cancer Maternal Aunt     Social History   Social History  . Marital status: Married    Spouse name: N/A  . Number of children: N/A  . Years of education: N/A   Social History Main Topics  . Smoking status: Former Smoker    Packs/day: 1.00    Years: 53.00    Start date: 12/22/1962    Quit date: 02/25/2017  . Smokeless tobacco: Never Used     Comment: Peak rate 1.5ppd - quit at most 6 months  . Alcohol use No  . Drug use: No  . Sexual activity: Not Currently   Other Topics Concern  . Not on file   Social History Narrative   [facility] (02/23/17):   Originally from [place]. Previously has lived in [place] as well as [place]. 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 Remote exposure to a parrot. Currently lives with her husband. Retired.      Objective:   Physical Exam BP 124/74 (BP Location: Left Arm, Cuff Size: Normal)   Pulse (!) 103   Ht 5' (1.524 m)   Wt 123 lb 6.4 oz (56 kg)   SpO2  93%   BMI 24.10 kg/m   General:  Awake. No distress. Accompanied by husband today. Integument:  Warm. Dry. No rash. Extremities:  No cyanosis or clubbing.  HEENT:  Moist mucus membranes. Mallampati class I. No oral ulcers Cardiovascular:  Regular rate. No edema. No JVD or venous distention. No hepatojugular reflux.  Pulmonary:  Clear bilaterally with auscultation. Normal work of breathing on room air. Abdomen: Soft. Normal bowel sounds. Nondistended. Nontender. Musculoskeletal:  Normal bulk and tone. Hand grip, biceps, deltoids, and hip strength 5/5 bilaterally. No joint deformity or effusion appreciated. No appreciable swelling of her neck, face, or upper extremities on my exam. Neurological:  Cranial nerves 2-12 grossly in tact. No meningismus. No visual field   defect.   6MWT 03/08/17:  Walked 3 laps / Baseline Sat 90% on Room Air / Nadir Sat 84% on Room Air   IMAGING CTA CHEST 02/23/17 (previously reviewed by me):  Previously reviewed by me. Small pericardial effusion. Mild apical predominant emphysematous changes. Approximately 2.6 cm right adrenal nodule appreciated. No obvious skeletal metastases. Right upper lobe central mass noted with evidence of what appears to be lymphangitic spread. Right suprahilar soft tissue density suspicious for mass versus pathologically enlarged lymph nodes causing either extrinsic compression of airway or endobronchial invasion. No obvious pulmonary arterial invasion. Unable to appreciate pathologic mediastinal or right hilar adenopathy with possible mass invasion. No pleural effusion or thickening appreciated. Linear opacity left lung base suggestive of atelectasis. Necrotic mass present within the lingula measuring up to 2.1 cm. Additional left upper lobe nodule measuring approximately 1 cm identified. Radiology noted mass effect on the IVC with possible invasion as well as a small pericardial effusion.  CARDIAC TTE (02/23/17):  LV normal in size with EF  65-70%. No regional wall motion abnormalities & grade 1 diastolic dysfunction. LA & RA normal in size. RV normal in size and function. Pulmonary artery systolic pressure 32 mmHg. No aortic stenosis or regurgitation. No mitral stenosis or regurgitation. No significant pulmonic regurgitation with poorly visualized valve. No tricuspid regurgitation. No pericardial effusion.   PATHOLOGY Right Upper Lobe Lavage Cytology 02/25/17:  Atypical Cells Present Right Upper Lobe Endobronchial Brushing 02/25/17:  Negative for Malignancy  MICROBIOLOGY Blood Cultures x2 02/23/17:  Negative  Urine Culture 02/22/17:  Multiple species Urine Streptococcal Antigen 02/23/17:  Negative  Urine Legionella Antigen 02/23/17:  Negative  Quantitative BAL Culture RUL 02/25/17:  Normal respiratory flora     Assessment & Plan:  71 y.o. female with right upper lobe mass. I am significant only concerned about the newly blurry vision in her left eye. She has no other physical exam findings on her neurologic exam today. She has no appreciable swelling of her neck, face, or upper extremities. Reviewing her previous CT angiogram if anything I would expect an effect on the inferior vena cava. At this time I am going to attempt to expedite her PET/CT scan and also perform imaging of her head and neck. Patient does have a metal plate in her neck and has been told after placement of the plate that she can never have an MRI. I instructed the patient and her husband to contact me if she had any new problems or concerns before her procedure.  1. Throat/neck swelling:  Not appreciated on physical exam. Continued monitoring. 2. Blurry vision left eye: Checking CT and CT angiogram of the head and neck with and without contrast ASAP. 3. Right upper lobe mass: Attempting to expedite PET/CT scan. Continuing with planned bronchoscopy on Monday with endobronchial ultrasound. 4. COPD/asthma: Continuing nebulizer therapies every 12 hours. 5. Acute hypoxic  respiratory failure: Continuing on oxygen as previously prescribed. Previously ordered portable concentrator. 6. 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 7. Tobacco use disorder: Patient encouraged to continue to abstain from tobacco. 8. Health maintenance: Status post influenza vaccine at last appointment on 03/08/17. 9. Follow-up: Return to clinic in November as scheduled.  I have spent a total of 27 minutes of time today evaluating the patient, reviewing the patient's electronic medical record, and discussing the plan of care with patient and husband with more than 50% of that time spent in face-to-face contact during her visit today.  Jennings E. Nestor, M.D. [place]   [facility] Pager:  [phone] After 7pm or if no response, call [phone] 3:50 PM 03/13/17

## 2017-03-19 ENCOUNTER — Encounter: Payer: Self-pay | Admitting: Radiation Oncology

## 2017-03-19 ENCOUNTER — Other Ambulatory Visit: Payer: Self-pay | Admitting: Pulmonary Disease

## 2017-03-19 DIAGNOSIS — R918 Other nonspecific abnormal finding of lung field: Secondary | ICD-10-CM

## 2017-03-20 ENCOUNTER — Encounter (HOSPITAL_COMMUNITY): Payer: Self-pay | Admitting: Pulmonary Disease

## 2017-03-25 ENCOUNTER — Ambulatory Visit (HOSPITAL_COMMUNITY)
Admission: RE | Admit: 2017-03-25 | Discharge: 2017-03-25 | Disposition: A | Discharge status: Home / Self Care | Payer: Medicare Other | Source: Ambulatory Visit | Attending: Pulmonary Disease | Admitting: Pulmonary Disease

## 2017-03-25 ENCOUNTER — Other Ambulatory Visit: Payer: Self-pay | Admitting: Medical Oncology

## 2017-03-25 DIAGNOSIS — Z79899 Other long term (current) drug therapy: Secondary | ICD-10-CM | POA: Diagnosis not present

## 2017-03-25 DIAGNOSIS — Z803 Family history of malignant neoplasm of breast: Secondary | ICD-10-CM | POA: Diagnosis not present

## 2017-03-25 DIAGNOSIS — R918 Other nonspecific abnormal finding of lung field: Secondary | ICD-10-CM

## 2017-03-25 DIAGNOSIS — Z981 Arthrodesis status: Secondary | ICD-10-CM | POA: Diagnosis not present

## 2017-03-25 DIAGNOSIS — F329 Major depressive disorder, single episode, unspecified: Secondary | ICD-10-CM | POA: Diagnosis not present

## 2017-03-25 DIAGNOSIS — Z800 Family history of malignant neoplasm of digestive organs: Secondary | ICD-10-CM | POA: Diagnosis not present

## 2017-03-25 DIAGNOSIS — E119 Type 2 diabetes mellitus without complications: Secondary | ICD-10-CM | POA: Diagnosis not present

## 2017-03-25 DIAGNOSIS — I700 Atherosclerosis of aorta: Secondary | ICD-10-CM | POA: Insufficient documentation

## 2017-03-25 DIAGNOSIS — C3491 Malignant neoplasm of unspecified part of right bronchus or lung: Secondary | ICD-10-CM | POA: Diagnosis not present

## 2017-03-25 DIAGNOSIS — M1990 Unspecified osteoarthritis, unspecified site: Secondary | ICD-10-CM | POA: Diagnosis not present

## 2017-03-25 DIAGNOSIS — Z9981 Dependence on supplemental oxygen: Secondary | ICD-10-CM | POA: Diagnosis not present

## 2017-03-25 DIAGNOSIS — J984 Other disorders of lung: Secondary | ICD-10-CM | POA: Insufficient documentation

## 2017-03-25 DIAGNOSIS — H538 Other visual disturbances: Secondary | ICD-10-CM

## 2017-03-25 DIAGNOSIS — Z87891 Personal history of nicotine dependence: Secondary | ICD-10-CM | POA: Diagnosis not present

## 2017-03-25 DIAGNOSIS — Z7982 Long term (current) use of aspirin: Secondary | ICD-10-CM | POA: Diagnosis not present

## 2017-03-25 DIAGNOSIS — J449 Chronic obstructive pulmonary disease, unspecified: Secondary | ICD-10-CM | POA: Diagnosis not present

## 2017-03-25 DIAGNOSIS — I871 Compression of vein: Secondary | ICD-10-CM | POA: Diagnosis not present

## 2017-03-25 DIAGNOSIS — Z833 Family history of diabetes mellitus: Secondary | ICD-10-CM | POA: Diagnosis not present

## 2017-03-25 DIAGNOSIS — E785 Hyperlipidemia, unspecified: Secondary | ICD-10-CM | POA: Diagnosis not present

## 2017-03-25 DIAGNOSIS — Z880 Allergy status to penicillin: Secondary | ICD-10-CM | POA: Diagnosis not present

## 2017-03-25 DIAGNOSIS — Z9849 Cataract extraction status, unspecified eye: Secondary | ICD-10-CM | POA: Diagnosis not present

## 2017-03-25 DIAGNOSIS — R221 Localized swelling, mass and lump, neck: Secondary | ICD-10-CM

## 2017-03-25 DIAGNOSIS — J381 Polyp of vocal cord and larynx: Secondary | ICD-10-CM | POA: Diagnosis not present

## 2017-03-25 DIAGNOSIS — Z888 Allergy status to other drugs, medicaments and biological substances status: Secondary | ICD-10-CM | POA: Diagnosis not present

## 2017-03-25 DIAGNOSIS — I110 Hypertensive heart disease with heart failure: Secondary | ICD-10-CM | POA: Diagnosis not present

## 2017-03-25 DIAGNOSIS — Z808 Family history of malignant neoplasm of other organs or systems: Secondary | ICD-10-CM | POA: Diagnosis not present

## 2017-03-25 DIAGNOSIS — I5032 Chronic diastolic (congestive) heart failure: Secondary | ICD-10-CM | POA: Diagnosis not present

## 2017-03-25 LAB — GLUCOSE, CAPILLARY: Glucose-Capillary: 180 mg/dL — ABNORMAL HIGH (ref 65–99)

## 2017-03-25 MED ORDER — FLUDEOXYGLUCOSE F - 18 (FDG) INJECTION
6.3000 | Freq: Once | INTRAVENOUS | Status: AC | PRN
  Administered 2017-03-25: 6.3 via INTRAVENOUS

## 2017-03-25 NOTE — Progress Notes (Addendum)
Thoracic Location of Tumor / Histology: Right Upper Mass  Patient presented  months ago with symptoms of: throat swelling, , edema in upper chest,neck,earlobes and arms,   Biopsies of  (if applicable) revealed: Diagnosis 03/18/2017: FINE NEEDLE ASPIRATION, ENDOSCOPIC,(EBUS) # 7 NODE (SPECIMEN 1 OF 1 COLLECTED 03/18/17): RARE ATYPICAL CELLS. MIXED LYMPHOID POPULATION  Diagnosis 02/25/17: BRONCHIAL WASHING, RUL (SPECIMEN 2 OF 2 COLLECTED 02/25/17): ATYPICAL CELLS PRESENT. Diagnosis 02/25/17: BRONCHIAL BRUSHING, RUL (SPECIMEN 1 OF 2 COLLECTED 02/25/17): BENIGN REACTIVE/REPARATIVE CHANGES.  Tobacco/Marijuana/Snuff/ETOH use: 1 ppd x 53 years quit 02/25/17, No alcohol or drug use,  Past/Anticipated interventions by cardiothoracic surgery, if any: Dr. Tera Partridge, MD, 03/18/17 : Procedures Performed: 1. Bronchoscopy with Airway Inspection 2. Endobronchial Ultrasound with Needle Aspiration 02/25/17:Bronchoscopy  04/11/17 follow up   Past/Anticipated interventions by medical oncology, if any: 03/26/17 Mikey Bussing, NP appt  Signs/Symptoms  Weight changes, if any:  No  Respiratory complaints, if any: Shortness breath, on 2 liters oxygen  Wears at night and prn during the day  Hemoptysis, if any: No  Pain issues, if any: NO  SAFETY ISSUES:  Prior radiation? no  Pacemaker/ICD?NO  Possible current pregnancy?NO  Is the patient on methotrexate?   Current Complaints / other details: Married, COPD, Asthma, portable oxygen 2 liters n/c,   postobstructive pneumonia Depression, DM diet controlled, HTN, Cardiac catheterization 1999,  Paternal Uncle prostate cancer, Maternal Aunt bone cancer, Cousin breast cancer,   Allergies: PCNS+Rash, facial/tongue swelling,, Tiotropium Bromide monohydrate -eye pain,kidney function slowed down VITALS in Med/Onc=97.6,106/30,98,18, 96% 2 liters n/c

## 2017-03-26 ENCOUNTER — Encounter: Payer: Self-pay | Admitting: Radiation Oncology

## 2017-03-26 ENCOUNTER — Encounter (HOSPITAL_COMMUNITY): Payer: Self-pay | Admitting: *Deleted

## 2017-03-26 ENCOUNTER — Inpatient Hospital Stay (HOSPITAL_COMMUNITY)
Admission: AD | Admit: 2017-03-26 | Discharge: 2017-03-27 | DRG: 300 | Disposition: A | Discharge status: Home / Self Care | Payer: Medicare Other | Source: Ambulatory Visit | Attending: Family Medicine | Admitting: Family Medicine

## 2017-03-26 ENCOUNTER — Ambulatory Visit
Admission: RE | Admit: 2017-03-26 | Discharge: 2017-03-26 | Disposition: A | Discharge status: Home / Self Care | Payer: Medicare Other | Source: Ambulatory Visit | Attending: Radiation Oncology | Admitting: Radiation Oncology

## 2017-03-26 ENCOUNTER — Ambulatory Visit (HOSPITAL_BASED_OUTPATIENT_CLINIC_OR_DEPARTMENT_OTHER): Payer: Medicare Other | Admitting: Oncology

## 2017-03-26 ENCOUNTER — Other Ambulatory Visit (HOSPITAL_BASED_OUTPATIENT_CLINIC_OR_DEPARTMENT_OTHER): Payer: Medicare Other

## 2017-03-26 ENCOUNTER — Other Ambulatory Visit: Payer: Self-pay

## 2017-03-26 VITALS — BP 106/30 | HR 98 | Temp 97.6°F | Resp 18 | Ht 60.0 in | Wt 124.5 lb

## 2017-03-26 DIAGNOSIS — I871 Compression of vein: Secondary | ICD-10-CM | POA: Diagnosis present

## 2017-03-26 DIAGNOSIS — R591 Generalized enlarged lymph nodes: Secondary | ICD-10-CM | POA: Diagnosis not present

## 2017-03-26 DIAGNOSIS — Z9981 Dependence on supplemental oxygen: Secondary | ICD-10-CM

## 2017-03-26 DIAGNOSIS — Z833 Family history of diabetes mellitus: Secondary | ICD-10-CM

## 2017-03-26 DIAGNOSIS — J381 Polyp of vocal cord and larynx: Secondary | ICD-10-CM | POA: Diagnosis not present

## 2017-03-26 DIAGNOSIS — E279 Disorder of adrenal gland, unspecified: Secondary | ICD-10-CM

## 2017-03-26 DIAGNOSIS — J449 Chronic obstructive pulmonary disease, unspecified: Secondary | ICD-10-CM | POA: Diagnosis not present

## 2017-03-26 DIAGNOSIS — E119 Type 2 diabetes mellitus without complications: Secondary | ICD-10-CM

## 2017-03-26 DIAGNOSIS — E785 Hyperlipidemia, unspecified: Secondary | ICD-10-CM | POA: Diagnosis present

## 2017-03-26 DIAGNOSIS — R918 Other nonspecific abnormal finding of lung field: Secondary | ICD-10-CM

## 2017-03-26 DIAGNOSIS — F329 Major depressive disorder, single episode, unspecified: Secondary | ICD-10-CM | POA: Diagnosis present

## 2017-03-26 DIAGNOSIS — I5032 Chronic diastolic (congestive) heart failure: Secondary | ICD-10-CM | POA: Diagnosis not present

## 2017-03-26 DIAGNOSIS — Z9849 Cataract extraction status, unspecified eye: Secondary | ICD-10-CM | POA: Diagnosis not present

## 2017-03-26 DIAGNOSIS — Z7982 Long term (current) use of aspirin: Secondary | ICD-10-CM

## 2017-03-26 DIAGNOSIS — Z79899 Other long term (current) drug therapy: Secondary | ICD-10-CM | POA: Diagnosis not present

## 2017-03-26 DIAGNOSIS — M1990 Unspecified osteoarthritis, unspecified site: Secondary | ICD-10-CM | POA: Diagnosis not present

## 2017-03-26 DIAGNOSIS — Z800 Family history of malignant neoplasm of digestive organs: Secondary | ICD-10-CM

## 2017-03-26 DIAGNOSIS — Z87891 Personal history of nicotine dependence: Secondary | ICD-10-CM | POA: Diagnosis not present

## 2017-03-26 DIAGNOSIS — R220 Localized swelling, mass and lump, head: Secondary | ICD-10-CM | POA: Diagnosis not present

## 2017-03-26 DIAGNOSIS — Z803 Family history of malignant neoplasm of breast: Secondary | ICD-10-CM

## 2017-03-26 DIAGNOSIS — I110 Hypertensive heart disease with heart failure: Secondary | ICD-10-CM | POA: Diagnosis present

## 2017-03-26 DIAGNOSIS — Z981 Arthrodesis status: Secondary | ICD-10-CM | POA: Diagnosis not present

## 2017-03-26 DIAGNOSIS — Z880 Allergy status to penicillin: Secondary | ICD-10-CM

## 2017-03-26 DIAGNOSIS — Z808 Family history of malignant neoplasm of other organs or systems: Secondary | ICD-10-CM | POA: Diagnosis not present

## 2017-03-26 DIAGNOSIS — Z888 Allergy status to other drugs, medicaments and biological substances status: Secondary | ICD-10-CM

## 2017-03-26 DIAGNOSIS — C3491 Malignant neoplasm of unspecified part of right bronchus or lung: Secondary | ICD-10-CM | POA: Diagnosis present

## 2017-03-26 DIAGNOSIS — F17200 Nicotine dependence, unspecified, uncomplicated: Secondary | ICD-10-CM | POA: Diagnosis present

## 2017-03-26 LAB — HEMOGLOBIN A1C
Hgb A1c MFr Bld: 7.9 % — ABNORMAL HIGH (ref 4.8–5.6)
Mean Plasma Glucose: 180.03 mg/dL

## 2017-03-26 LAB — CBC
HCT: 37 % (ref 36.0–46.0)
HEMOGLOBIN: 12.1 g/dL (ref 12.0–15.0)
MCH: 31.5 pg (ref 26.0–34.0)
MCHC: 32.7 g/dL (ref 30.0–36.0)
MCV: 96.4 fL (ref 78.0–100.0)
PLATELETS: 264 10*3/uL (ref 150–400)
RBC: 3.84 MIL/uL — AB (ref 3.87–5.11)
RDW: 13 % (ref 11.5–15.5)
WBC: 8.2 10*3/uL (ref 4.0–10.5)

## 2017-03-26 LAB — CBC WITH DIFFERENTIAL/PLATELET
BASO%: 0.7 % (ref 0.0–2.0)
Basophils Absolute: 0.1 10*3/uL (ref 0.0–0.1)
EOS%: 2.8 % (ref 0.0–7.0)
Eosinophils Absolute: 0.2 10*3/uL (ref 0.0–0.5)
HEMATOCRIT: 38.9 % (ref 34.8–46.6)
HGB: 12.9 g/dL (ref 11.6–15.9)
LYMPH%: 22 % (ref 14.0–49.7)
MCH: 31.7 pg (ref 25.1–34.0)
MCHC: 33.1 g/dL (ref 31.5–36.0)
MCV: 96 fL (ref 79.5–101.0)
MONO#: 0.9 10*3/uL (ref 0.1–0.9)
MONO%: 11.3 % (ref 0.0–14.0)
NEUT%: 63.2 % (ref 38.4–76.8)
NEUTROS ABS: 5.1 10*3/uL (ref 1.5–6.5)
PLATELETS: 284 10*3/uL (ref 145–400)
RBC: 4.05 10*6/uL (ref 3.70–5.45)
RDW: 13.1 % (ref 11.2–14.5)
WBC: 8 10*3/uL (ref 3.9–10.3)
lymph#: 1.8 10*3/uL (ref 0.9–3.3)

## 2017-03-26 LAB — COMPREHENSIVE METABOLIC PANEL
ALK PHOS: 64 U/L (ref 40–150)
ALT: 9 U/L (ref 0–55)
AST: 7 U/L (ref 5–34)
Albumin: 3.2 g/dL — ABNORMAL LOW (ref 3.5–5.0)
Anion Gap: 8 mEq/L (ref 3–11)
BILIRUBIN TOTAL: 0.51 mg/dL (ref 0.20–1.20)
BUN: 10.8 mg/dL (ref 7.0–26.0)
CALCIUM: 9.7 mg/dL (ref 8.4–10.4)
CO2: 28 mEq/L (ref 22–29)
CREATININE: 0.7 mg/dL (ref 0.6–1.1)
Chloride: 100 mEq/L (ref 98–109)
EGFR: >60 mL/min/{1.73_m2} (ref >60)
Glucose: 170 mg/dl — ABNORMAL HIGH (ref 70–140)
Potassium: 5.1 mEq/L (ref 3.5–5.1)
Sodium: 136 mEq/L (ref 136–145)
TOTAL PROTEIN: 6.6 g/dL (ref 6.4–8.3)

## 2017-03-26 LAB — TSH: TSH: 1.996 u[IU]/mL (ref 0.350–4.500)

## 2017-03-26 LAB — CREATININE, SERUM
CREATININE: 0.53 mg/dL (ref 0.44–1.00)
GFR calc Af Amer: >60 mL/min (ref >60)

## 2017-03-26 LAB — GLUCOSE, CAPILLARY
GLUCOSE-CAPILLARY: 172 mg/dL — AB (ref 65–99)
GLUCOSE-CAPILLARY: 347 mg/dL — AB (ref 65–99)

## 2017-03-26 LAB — PROTIME-INR
INR: 1.06
Prothrombin Time: 13.7 seconds (ref 11.4–15.2)

## 2017-03-26 MED ORDER — IPRATROPIUM-ALBUTEROL 0.5-2.5 (3) MG/3ML IN SOLN: 3.0000 mL | Freq: Four times a day (QID) | RESPIRATORY_TRACT | Status: DC | PRN

## 2017-03-26 MED ORDER — LOSARTAN POTASSIUM 50 MG PO TABS: 50.0000 mg | ORAL_TABLET | Freq: Every day | ORAL | Status: DC

## 2017-03-26 MED ORDER — HEPARIN SODIUM (PORCINE) 5000 UNIT/ML IJ SOLN
5000.0000 [IU] | Freq: Three times a day (TID) | INTRAMUSCULAR | Status: DC
  Filled 2017-03-26: qty 1

## 2017-03-26 MED ORDER — SODIUM CHLORIDE 0.9% FLUSH
3.0000 mL | Freq: Two times a day (BID) | INTRAVENOUS | Status: DC
  Administered 2017-03-26 – 2017-03-27 (×2): 3 mL via INTRAVENOUS

## 2017-03-26 MED ORDER — PANTOPRAZOLE SODIUM 40 MG PO TBEC
40.0000 mg | DELAYED_RELEASE_TABLET | Freq: Every day | ORAL | Status: DC
Start: 2017-03-27 — End: 2017-03-27
  Administered 2017-03-27: 40 mg via ORAL
  Filled 2017-03-26: qty 1

## 2017-03-26 MED ORDER — SODIUM CHLORIDE 0.9 % IV SOLN: 250.0000 mL | INTRAVENOUS | Status: DC | PRN

## 2017-03-26 MED ORDER — SODIUM CHLORIDE 0.9% FLUSH: 3.0000 mL | INTRAVENOUS | Status: DC | PRN

## 2017-03-26 MED ORDER — INSULIN ASPART 100 UNIT/ML ~~LOC~~ SOLN
0.0000 [IU] | Freq: Every day | SUBCUTANEOUS | Status: DC
  Administered 2017-03-26: 4 [IU] via SUBCUTANEOUS

## 2017-03-26 MED ORDER — HEPARIN SODIUM (PORCINE) 5000 UNIT/ML IJ SOLN
5000.0000 [IU] | Freq: Three times a day (TID) | INTRAMUSCULAR | Status: AC
  Administered 2017-03-26: 5000 [IU] via SUBCUTANEOUS
  Filled 2017-03-26: qty 1

## 2017-03-26 MED ORDER — HYDRALAZINE HCL 20 MG/ML IJ SOLN: 10.0000 mg | Freq: Four times a day (QID) | INTRAMUSCULAR | Status: DC | PRN

## 2017-03-26 MED ORDER — POLYETHYLENE GLYCOL 3350 17 G PO PACK: 17.0000 g | PACK | Freq: Every day | ORAL | Status: DC | PRN

## 2017-03-26 MED ORDER — DEXAMETHASONE SODIUM PHOSPHATE 10 MG/ML IJ SOLN
10.0000 mg | Freq: Two times a day (BID) | INTRAMUSCULAR | Status: DC
  Administered 2017-03-26 – 2017-03-27 (×2): 10 mg via INTRAVENOUS
  Filled 2017-03-26 (×2): qty 1

## 2017-03-26 MED ORDER — INSULIN ASPART 100 UNIT/ML ~~LOC~~ SOLN
0.0000 [IU] | Freq: Three times a day (TID) | SUBCUTANEOUS | Status: DC
  Administered 2017-03-27: 2 [IU] via SUBCUTANEOUS

## 2017-03-26 MED ORDER — ASPIRIN 81 MG PO CHEW
81.0000 mg | CHEWABLE_TABLET | Freq: Every day | ORAL | Status: DC
  Administered 2017-03-27: 81 mg via ORAL
  Filled 2017-03-26: qty 1

## 2017-03-26 MED ORDER — FLEET ENEMA 7-19 GM/118ML RE ENEM: 1.0000 | ENEMA | Freq: Once | RECTAL | Status: DC | PRN

## 2017-03-26 MED ORDER — CITALOPRAM HYDROBROMIDE 20 MG PO TABS
10.0000 mg | ORAL_TABLET | Freq: Every day | ORAL | Status: DC
  Administered 2017-03-27: 10 mg via ORAL
  Filled 2017-03-26: qty 1

## 2017-03-26 MED ORDER — SIMVASTATIN 40 MG PO TABS
40.0000 mg | ORAL_TABLET | Freq: Every evening | ORAL | Status: DC
  Administered 2017-03-26: 40 mg via ORAL
  Filled 2017-03-26: qty 1

## 2017-03-26 NOTE — Progress Notes (Signed)
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 The patient does not have confirmed tissue diagnosis of her malignancy.  CURRENT THERAPY: The patient will be evaluated later today for consideration of radiation to her right chest mass.  INTERVAL HISTORY: [name] 71 y.o. female returns for a routine follow-up visit accompanied by her husband. The patient has been having increased facial swelling for about the past 10 days. She has noticed of the blood vessels in her chest. She reports mild dyspnea on exertion, but no shortness of breath at rest. Denies fever and chills. Denies chest pain, cough, hemoptysis. Denies nausea, vomiting, constipation, diarrhea. The patient recently had a PET scan, a CT of the brain, and an endobronchial ultrasound with needle aspiration. She is here to discuss these results today.  MEDICAL HISTORY: Past Medical History:  Diagnosis Date  . Arthritis   . Asthma   . COPD (chronic obstructive pulmonary disease) ([facility])   . Depression   . Diabetes mellitus    diet controlled  . History of hiatal hernia   . Hyperlipemia   . Hypertension   . Pneumonia     ALLERGIES:  is allergic to penicillins and tiotropium bromide monohydrate.  MEDICATIONS:  Current Outpatient Prescriptions  Medication Sig Dispense Refill  . aspirin 81 MG tablet Take 81 mg by mouth daily.    . citalopram (CELEXA) 20 MG tablet Take 10 mg by mouth daily.     Marland Kitchen ipratropium-albuterol (DUONEB) 0.5-2.5 (3) MG/3ML SOLN Take 3 mLs by nebulization every 6 (six) hours as needed. (Patient taking differently: Take 3 mLs by nebulization every 6 (six) hours as needed (shortness of breath). ) 120 mL 3  .  losartan (COZAAR) 50 MG tablet Take 50 mg by mouth daily.  0  . simvastatin (ZOCOR) 40 MG tablet Take 40 mg by mouth every evening.    Marland Kitchen acetaminophen (TYLENOL) 500 MG tablet Take 500 mg by mouth every 8 (eight) hours as needed for mild pain or headache.    . Guaifenesin (MUCINEX MAXIMUM STRENGTH) 1200 MG TB12 Take 1,200 mg by mouth 2 (two) times daily.    Marland Kitchen omeprazole (PRILOSEC) 20 MG capsule Take 20 mg by mouth daily as needed (indigestion).     . polyethylene glycol (MIRALAX / GLYCOLAX) packet Take 17 g by mouth daily. (Patient taking differently: Take 17 g by mouth daily as needed for mild constipation. ) 14 each 0  . Spacer/Aero Chamber Mouthpiece MISC 1 Device by Does not apply route as directed. 1 each 0   No current facility-administered medications for this visit.     SURGICAL HISTORY:  Past Surgical History:  Procedure Laterality Date  . CARDIAC CATHETERIZATION  1999  . CARPAL TUNNEL RELEASE  2/13   right-[facility]  . CARPAL TUNNEL RELEASE  08/15/2011   Procedure: CARPAL TUNNEL RELEASE;  Surgeon: Linna Hoff, MD;  Location: [facility];  Service: Orthopedics;  Laterality: Left;  . CATARACT EXTRACTION    . CERVICAL FUSION  2002  . COLONOSCOPY    . ENDOBRONCHIAL ULTRASOUND Bilateral 03/18/2017   Procedure: ENDOBRONCHIAL ULTRASOUND;  Surgeon: Javier Glazier, MD;  Location: [facility];  Service: Cardiopulmonary;  Laterality: Bilateral;  . TONSILLECTOMY    .  VIDEO BRONCHOSCOPY Bilateral 02/25/2017   Procedure: VIDEO BRONCHOSCOPY WITHOUT FLUORO;  Surgeon: Javier Glazier, MD;  Location: [facility];  Service: Cardiopulmonary;  Laterality: Bilateral;    REVIEW OF SYSTEMS:   Review of Systems  Constitutional: Negative for appetite change, chills, fatigue, fever and unexpected weight change.  HENT:   Negative for mouth sores, nosebleeds, sore throat and trouble swallowing.  Positive for facial swelling. Eyes: Negative for eye problems and icterus.  Respiratory:  Negative for cough, hemoptysis, shortness of breath and wheezing. Positive for dyspnea on exertion.  Cardiovascular: Negative for chest pain and leg swelling.  Gastrointestinal: Negative for abdominal pain, constipation, diarrhea, nausea and vomiting.  Genitourinary: Negative for bladder incontinence, difficulty urinating, dysuria, frequency and hematuria.   Musculoskeletal: Negative for back pain, gait problem, neck pain and neck stiffness.  Skin: Negative for itching and rash.  Neurological: Negative for dizziness, extremity weakness, gait problem, headaches, light-headedness and seizures.  Hematological: Negative for adenopathy. Does not bruise/bleed easily.  Psychiatric/Behavioral: Negative for confusion, depression and sleep disturbance. The patient is not nervous/anxious.     PHYSICAL EXAMINATION:  Blood pressure (!) 106/30, pulse 98, temperature 97.6 F (36.4 C), temperature source Oral, resp. rate 18, height 5' (1.524 m), weight 124 lb 8 oz (56.5 kg), SpO2 96 %.  ECOG PERFORMANCE STATUS: 1 - Symptomatic but completely ambulatory  Physical Exam  Constitutional: Oriented to person, place, and time and well-developed, well-nourished, and in no distress. No distress.  HENT:  Head: Normocephalic and atraumatic.  Mouth/Throat: Oropharynx is clear and moist. No oropharyngeal exudate.  Eyes: Conjunctivae are normal. Right eye exhibits no discharge. Left eye exhibits no discharge. No scleral icterus.  Neck: Normal range of motion. Neck supple.  Cardiovascular: Normal rate, regular rhythm, normal heart sounds and intact distal pulses.   Pulmonary/Chest: Effort normal and breath sounds normal. No respiratory distress. No wheezes. No rales.  Abdominal: Soft. Bowel sounds are normal. Exhibits no distension and no mass. There is no tenderness.  Musculoskeletal: Normal range of motion. Exhibits no edema.  Lymphadenopathy:    No cervical adenopathy.  Neurological: Alert and oriented to person,  place, and time. Exhibits normal muscle tone. Gait normal. Coordination normal.  Skin: Skin is warm and dry. No rash noted. Not diaphoretic. No erythema. No pallor.  Psychiatric: Mood, memory and judgment normal.  Vitals reviewed.  LABORATORY DATA: Lab Results  Component Value Date   WBC 8.0 03/26/2017   HGB 12.9 03/26/2017   HCT 38.9 03/26/2017   MCV 96.0 03/26/2017   PLT 284 03/26/2017      Chemistry      Component Value Date/Time   NA 136 03/26/2017 1309   K 5.1 03/26/2017 1309   CL 97 (L) 02/26/2017 0452   CO2 28 03/26/2017 1309   BUN 10.8 03/26/2017 1309   CREATININE 0.7 03/26/2017 1309      Component Value Date/Time   CALCIUM 9.7 03/26/2017 1309   ALKPHOS 64 03/26/2017 1309   AST 7 03/26/2017 1309   ALT 9 03/26/2017 1309   BILITOT 0.51 03/26/2017 1309       RADIOGRAPHIC STUDIES:  Ct Angio Head W Or Wo Contrast  Result Date: 03/15/2017 CLINICAL DATA:  Visual loss and throat swelling over several weeks. EXAM: CT ANGIOGRAPHY HEAD AND NECK TECHNIQUE: Multidetector CT imaging of the head and neck was performed using the standard protocol during bolus administration of intravenous contrast. Multiplanar CT image reconstructions and MIPs were obtained to evaluate the vascular anatomy. Carotid stenosis measurements (when  applicable) are obtained utilizing NASCET criteria, using the distal internal carotid diameter as the denominator. CONTRAST:  100 mL Isovue 370 COMPARISON:  Chest CT 02/23/2017. FINDINGS: CT HEAD FINDINGS Brain: There is no evidence of acute infarct, intracranial hemorrhage, mass, midline shift, or extra-axial fluid collection. The ventricles and sulci are within normal limits for age. Periventricular white matter hypodensities are nonspecific but compatible with minimal chronic small vessel ischemic disease. Vascular: Calcified atherosclerosis at the skullbase. Skull: No fracture or focal osseous lesion. Sinuses: Minimal right ethmoid air cell mucosal  thickening. Clear mastoid air cells. Orbits: Bilateral cataract extraction. Review of the MIP images confirms the above findings CTA NECK FINDINGS Aortic arch: Standard 3 vessel aortic arch with moderate calcified plaque. Likely mild stenosis of the proximal brachiocephalic and left subclavian arteries. Limited assessment of the right subclavian artery due to calcified plaque and the adjacent dense venous contrast with resulting streak artifact. Right carotid system: Moderate calcified plaque at the carotid bifurcation and in the proximal ICA without significant common or internal carotid artery stenosis. Mild-to-moderate ECA origin stenosis. Left carotid system: Mild plaque at the carotid bifurcation without stenosis. Vertebral arteries: Patent with the left being dominant. No evidence of significant stenosis. Skeleton: Congenital C2-3 fusion. C5-6 ACDF with solid fusion. C4-5 disc degeneration with bulging, annular calcification and uncovertebral spurring resulting in suspected moderate spinal stenosis and left greater than right neural foraminal stenosis. Other neck: Asymmetric effacement of the right piriform sinus by small volume mildly nodular soft tissue and possibly trace fluid. Mild diffuse subcutaneous and deep fat stranding/edema throughout the neck bilaterally. Mild retropharyngeal edema without fluid collection. Upper chest: Partially visualized right hilar/central right upper lobe mass as demonstrated on the prior chest CT with increased right upper lobe collapse/ consolidation. Small right pleural effusion. SCC narrowing. Right upper lobe bronchus encasement. Review of the MIP images confirms the above findings CTA HEAD FINDINGS Anterior circulation: The internal carotid arteries are patent from skullbase to carotid termini. There is minimal carotid siphon atherosclerosis without significant stenosis. ACAs and MCAs are patent without evidence of proximal branch occlusion or significant stenosis. No  aneurysm. Posterior circulation: The intracranial vertebral arteries are patent to the basilar. Patent PICA and SCA origins are identified bilaterally. The basilar artery is patent and mildly small in caliber diffusely on a congenital basis. There are patent posterior communicating arteries with hypoplastic P1 segments bilaterally. No significant proximal PCA stenosis. No aneurysm. Venous sinuses: Patent. Anatomic variants: Hypoplastic P1 segments. Delayed phase: No abnormal enhancement. Review of the MIP images confirms the above findings IMPRESSION: 1. No large vessel occlusion or significant intracranial arterial stenosis. 2. Right greater than left cervical carotid artery atherosclerosis without significant common or internal carotid artery stenosis. Mild-to-moderate right ECA origin stenosis. 3. Aortic Atherosclerosis (ICD10-I70.0). Mild proximal brachiocephalic and left subclavian artery stenosis. 4. Partially visualized right hilar mass as seen on recent chest CT with progressive right upper lobe collapse/consolidation. SVC narrowing with multiple collateral vessels. 5. Mild diffuse edema in the neck which may be secondary to SVC obstruction. 6. Asymmetric, mildly nodular soft tissue at the right tongue base. Consider direct visualization. 7. No evidence of acute intracranial abnormality or intracranial metastases. Electronically Signed   By: Logan Bores M.D.   On: 03/15/2017 09:29   Ct Angio Neck W Or Wo Contrast  Result Date: 03/15/2017 CLINICAL DATA:  Visual loss and throat swelling over several weeks. EXAM: CT ANGIOGRAPHY HEAD AND NECK TECHNIQUE: Multidetector CT imaging of the head and neck was performed  using the standard protocol during bolus administration of intravenous contrast. Multiplanar CT image reconstructions and MIPs were obtained to evaluate the vascular anatomy. Carotid stenosis measurements (when applicable) are obtained utilizing NASCET criteria, using the distal internal carotid  diameter as the denominator. CONTRAST:  100 mL Isovue 370 COMPARISON:  Chest CT 02/23/2017. FINDINGS: CT HEAD FINDINGS Brain: There is no evidence of acute infarct, intracranial hemorrhage, mass, midline shift, or extra-axial fluid collection. The ventricles and sulci are within normal limits for age. Periventricular white matter hypodensities are nonspecific but compatible with minimal chronic small vessel ischemic disease. Vascular: Calcified atherosclerosis at the skullbase. Skull: No fracture or focal osseous lesion. Sinuses: Minimal right ethmoid air cell mucosal thickening. Clear mastoid air cells. Orbits: Bilateral cataract extraction. Review of the MIP images confirms the above findings CTA NECK FINDINGS Aortic arch: Standard 3 vessel aortic arch with moderate calcified plaque. Likely mild stenosis of the proximal brachiocephalic and left subclavian arteries. Limited assessment of the right subclavian artery due to calcified plaque and the adjacent dense venous contrast with resulting streak artifact. Right carotid system: Moderate calcified plaque at the carotid bifurcation and in the proximal ICA without significant common or internal carotid artery stenosis. Mild-to-moderate ECA origin stenosis. Left carotid system: Mild plaque at the carotid bifurcation without stenosis. Vertebral arteries: Patent with the left being dominant. No evidence of significant stenosis. Skeleton: Congenital C2-3 fusion. C5-6 ACDF with solid fusion. C4-5 disc degeneration with bulging, annular calcification and uncovertebral spurring resulting in suspected moderate spinal stenosis and left greater than right neural foraminal stenosis. Other neck: Asymmetric effacement of the right piriform sinus by small volume mildly nodular soft tissue and possibly trace fluid. Mild diffuse subcutaneous and deep fat stranding/edema throughout the neck bilaterally. Mild retropharyngeal edema without fluid collection. Upper chest: Partially  visualized right hilar/central right upper lobe mass as demonstrated on the prior chest CT with increased right upper lobe collapse/ consolidation. Small right pleural effusion. SCC narrowing. Right upper lobe bronchus encasement. Review of the MIP images confirms the above findings CTA HEAD FINDINGS Anterior circulation: The internal carotid arteries are patent from skullbase to carotid termini. There is minimal carotid siphon atherosclerosis without significant stenosis. ACAs and MCAs are patent without evidence of proximal branch occlusion or significant stenosis. No aneurysm. Posterior circulation: The intracranial vertebral arteries are patent to the basilar. Patent PICA and SCA origins are identified bilaterally. The basilar artery is patent and mildly small in caliber diffusely on a congenital basis. There are patent posterior communicating arteries with hypoplastic P1 segments bilaterally. No significant proximal PCA stenosis. No aneurysm. Venous sinuses: Patent. Anatomic variants: Hypoplastic P1 segments. Delayed phase: No abnormal enhancement. Review of the MIP images confirms the above findings IMPRESSION: 1. No large vessel occlusion or significant intracranial arterial stenosis. 2. Right greater than left cervical carotid artery atherosclerosis without significant common or internal carotid artery stenosis. Mild-to-moderate right ECA origin stenosis. 3. Aortic Atherosclerosis (ICD10-I70.0). Mild proximal brachiocephalic and left subclavian artery stenosis. 4. Partially visualized right hilar mass as seen on recent chest CT with progressive right upper lobe collapse/consolidation. SVC narrowing with multiple collateral vessels. 5. Mild diffuse edema in the neck which may be secondary to SVC obstruction. 6. Asymmetric, mildly nodular soft tissue at the right tongue base. Consider direct visualization. 7. No evidence of acute intracranial abnormality or intracranial metastases. Electronically Signed   By:  Logan Bores M.D.   On: 03/15/2017 09:29   Nm Pet Image Initial (pi) Skull Base To Thigh  Result  Date: 03/25/2017 CLINICAL DATA:  Initial treatment strategy for lung mass. EXAM: NUCLEAR MEDICINE PET SKULL BASE TO THIGH TECHNIQUE: 6.3 MCi F-18 FDG was injected intravenously. Full-ring PET imaging was performed from the skull base to thigh after the radiotracer. CT data was obtained and used for attenuation correction and anatomic localization. FASTING BLOOD GLUCOSE:  Value: 180 mg/dl COMPARISON:  CT chest 02/23/2017 FINDINGS: NECK: No hypermetabolic lymph nodes in the neck. CHEST: Small right pleural effusion noted. The central right perihilar and right paratracheal lung mass exhibits intense FDG uptake with an SUV max equal to 6.28. There is occlusion of the right upper lobe bronchus with postobstructive consolidation and atelectasis which is now involving the entire right upper lobe. There is a cavitary lung nodule within the left upper lobe which measures 2.4 cm and has an SUV max equal to 9.73. No hypermetabolic sub- carinal, left-sided mediastinal or left hilar hypermetabolic adenopathy. ABDOMEN/PELVIS: No abnormal hypermetabolic activity within the liver, pancreas, adrenal glands, or spleen. Aortic atherosclerosis noted. No significant FDG uptake associated with the low-attenuation right adrenal nodule compatible with benign adenoma. No hypermetabolic lymph nodes in the abdomen or pelvis. SKELETON: No focal hypermetabolic activity to suggest skeletal metastasis. IMPRESSION: 1. Central obstructing right paratracheal and right hilar lung mass exhibits intense FDG uptake and there is associated complete atelectasis and consolidation of the right upper lobe. 2. Cavitary lung lesion within the left upper lobe is also hypermetabolic and may represent a focus of metastatic disease or a second primary lung neoplasm. 3. No evidence for distant hypermetabolic metastasis 4. Aortic atherosclerosis. Electronically  Signed   By: Kerby Moors M.D.   On: 03/25/2017 09:05   Dg Chest Port 1 View  Result Date: 03/18/2017 CLINICAL DATA:  Status post lymph node biopsy EXAM: PORTABLE CHEST 1 VIEW COMPARISON:  Chest radiograph February 22, 2017 and chest CT February 23, 2017 FINDINGS: No demonstrable pneumothorax. There is volume loss the right with airspace consolidation in the right upper lobe. The area of apparent mass in the right upper lobe is not well seen by radiography. Adenopathy is noted in the right hilar region. The left lung is clear except for mild left midlung and left base atelectatic change. Heart size is normal. There is aortic atherosclerosis. There is postoperative change in the lower cervical spine. IMPRESSION: No pneumothorax. Opacity with volume loss right upper lobe. Previously noted masslike area right upper lobe not well seen currently. There is adenopathy in the right hilar region. No edema or consolidation on the left. Areas of atelectasis noted in left mid lung and left base. There is aortic atherosclerosis. Aortic Atherosclerosis (ICD10-I70.0). Electronically Signed   By: Lowella Grip III M.D.   On: 03/18/2017 10:06   PATHOLOGY:   Diagnosis FINE NEEDLE ASPIRATION, ENDOSCOPIC,(EBUS) # 7 NODE (SPECIMEN 1 OF 1 COLLECTED 03/18/17): RARE ATYPICAL CELLS. MIXED LYMPHOID POPULATION. Preliminary Diagnosis Intraoperative Diagnosis: ATYPICAL CELLS ([facility])  Diagnosis BRONCHIAL WASHING, RUL (SPECIMEN 2 OF 2 COLLECTED 02/25/17): ATYPICAL CELLS PRESENT. SEE COMMENT. COMMENT: THE ATYPICAL CELLS ARE NOT DEFINITELY DIAGNOSTIC OF MALIGNANCY IN MY OPINION.  BRONCHIAL BRUSHING, RUL (SPECIMEN 1 OF 2 COLLECTED 02/25/17): BENIGN REACTIVE/REPARATIVE CHANGES.  ASSESSMENT/PLAN:  No problem-specific Assessment & Plan notes found for this encounter.  This is a very pleasant 71 year old white female with highly suspicious stage IV non-small cell lung cancer presented with right upper lobe lung mass in  addition to right hilar and mediastinal lymphadenopathy as well as necrotic left upper lobe nodule. The patient does not  have confirmed tissue diagnosis of her malignancy.  The patient was seen with Dr. Julien Nordmann. PET scan, CT scan, and biopsy results were discussed with the patient and her husband. Explained to the patient that she has a central obstructing mass in her right lung and a smaller lesion in her left upper lung. There was no evidence of metastatic disease elsewhere on the PET scan or in the CT of the brain. Explained to the patient that she will need radiation to her right lung mass to help her with her breathing and facial swelling. She has an appointment with radiation oncology later today. We again had a discussion with the patient and her husband about the need for a biopsy.  The patient will be referred to interventional radiology to get a CT guided core biopsy of the left upper lobe lung nodule epeat biopsy most likely with CT-guided core biopsy of the left upper lobe lung nodule. Order was placed for this today. I will arrange for the patient to come back for follow-up visit in 2 weeks for reevaluation and discussion of her treatment options. The patient was advised to call immediately if she has any concerning symptoms in the interval. The patient voices understanding of current disease status and treatment options and is in agreement with the current care plan.  All questions were answered. The patient knows to call the clinic with any problems, questions or concerns. We can certainly see the patient much sooner if necessary.  Orders Placed This Encounter  Procedures  . CT BIOPSY    Standing Status:   Future    Standing Expiration Date:   06/26/2018    Order Specific Question:   Reason for exam:    Answer:   Lung mass. CT guided core biopsy of L upper lobe.    Order Specific Question:   Preferred imaging location?    Answer:   [place], [place],  AGPCNP-BC, [place] 03/26/17  ADDENDUM: Hematology/Oncology Attending: I had a face-to-face encounter with the patient.  I recommended her care plan.  This is a very pleasant 71 years old white female with highly suspicious metastatic lung cancer presented with right upper lobe obstructive mass in addition to cavitary lesion in the left upper lobe.  The patient has no tissue diagnosis until now.  She was referred previously for CT-guided core biopsy of the left upper lobe lung mass but this was discontinued by Dr. Ashok Cordia who felt that he would be able to get the diagnosis with repeat bronchoscopy and endobronchial ultrasound.  Unfortunately the new biopsy was not conclusive and showed only atypical cells. The patient started having swelling of her face and neck area secondary to early SVC syndrome with enlargement of the right hilar and mediastinal mass with postobstructive collapse of the right upper lobe. She is supposed to see radiation oncology later today for evaluation and starting palliative radiotherapy to the obstructive right lung lesion. We will arrange for the patient to have CT-guided core biopsy of the left upper lobe lung mass by interventional radiology for confirmation of her tissue diagnosis. We will see the patient back for follow-up visit in 1-2 weeks for reevaluation and discussion of systemic treatment options. She was advised to call immediately if she has any concerning symptoms in the interval.  Disclaimer: This note was dictated with voice recognition software. Similar sounding words can inadvertently be transcribed and may be missed upon review. Eilleen Kempf, MD 03/27/17

## 2017-03-26 NOTE — Progress Notes (Signed)
Patient arrived to unit alert, oriented ambulatory without assist. Pt reports feeling well over all. Will continue to monitor. Provider paged

## 2017-03-26 NOTE — Plan of Care (Signed)
Problem: Safety: Goal: Ability to remain free from injury will improve Outcome: Completed/Met Date Met: 03/26/17 Bed alarm set. Pt is aware to call

## 2017-03-26 NOTE — H&P (Signed)
TRH H&P   Patient Demographics:    [name], is a 71 y.o. female  MRN: [id number]   DOB - [birth year]  Admit Date - 03/26/2017  Outpatient Primary MD for the patient is [name], MD  Outpatient Specialists: Dr. Julien Nordmann, Dr. Tera Partridge  Patient coming from: Rad. Oncology office  No chief complaint on file.     HPI:    [name]  is a 71 y.o. female, with a known history of right upper lobe lung mass suspected to be cancer, 2 unsuccessful Bronk directed biopsy at times in the last few weeks, osteoarthritis, COPD/asthma, recently quit smoking, on home oxygen, DM type II, dyslipidemia and hypertension who was admitted about a month ago for suspected pneumonia and eventually found to have a right upper lobe mass.  This mass looked suspicious for malignancy, at that time pulmonary attempted twice via bronchoscopy to get a specimen but failed.  She followed with Dr. Julien Nordmann outpatient who had scheduled her for a radiation treatment today as she was developing mild SVC syndrome.  Patient presented to radiation office for possible initiation of radiation treatments however it was felt that it would be best to get some tissue diagnosis prior to starting treatment.  She was then transferred here for treatment of SVC syndrome and arranging CT-guided biopsy.  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  Surprisingly she does not have any unexpected weight loss, chronic cough, no abdominal pain, no dysuria, no blood in stool or urine or focal weakness.    Review of systems:     A full 10 point Review of Systems was done, except as stated above, all other Review of Systems were negative.   With Past History of the following :    Past Medical History:  Diagnosis Date  . Arthritis   . Asthma   . COPD (chronic obstructive pulmonary disease) ([place])   . Depression   . Diabetes mellitus    diet controlled  . History  of hiatal hernia   . Hyperlipemia   . Hypertension   . Pneumonia       Past Surgical History:  Procedure Laterality Date  . CARDIAC CATHETERIZATION  1999  . CARPAL TUNNEL RELEASE  2/13   right-GSC  . CARPAL TUNNEL RELEASE  08/15/2011   Procedure: CARPAL TUNNEL RELEASE;  Surgeon: Linna Hoff, MD;  Location: [facility];  Service: Orthopedics;  Laterality: Left;  . CATARACT EXTRACTION    . CERVICAL FUSION  2002  . COLONOSCOPY    . ENDOBRONCHIAL ULTRASOUND Bilateral 03/18/2017   Procedure: ENDOBRONCHIAL ULTRASOUND;  Surgeon: Javier Glazier, MD;  Location: [facility];  Service: Cardiopulmonary;  Laterality: Bilateral;  . TONSILLECTOMY    . VIDEO BRONCHOSCOPY Bilateral 02/25/2017   Procedure: VIDEO BRONCHOSCOPY WITHOUT FLUORO;  Surgeon: Javier Glazier, MD;  Location: [facility];  Service: Cardiopulmonary;  Laterality: Bilateral;      Social History:     Social History  Substance Use Topics  . Smoking status: Former Smoker    Packs/day: 1.00    Years: 53.00    Start date: 12/22/1962    Quit date: 02/25/2017  . Smokeless tobacco: Never Used     Comment: Peak rate 1.5ppd - quit at most 6 months  . Alcohol use No         Family History :     Family History  Problem Relation Age of Onset  . Diabetes Mother   . Colon cancer Father   . Cerebral palsy Brother   . Breast cancer Paternal Aunt   . Breast  cancer Cousin   . Prostate cancer Paternal Uncle   . Bone cancer Maternal Aunt        Home Medications:   Prior to Admission medications   Medication Sig Start Date End Date Taking? Authorizing Provider  acetaminophen (TYLENOL) 500 MG tablet Take 500 mg by mouth every 8 (eight) hours as needed for mild pain or headache.    [provider]  aspirin 81 MG tablet Take 81 mg by mouth daily.    [provider]  citalopram (CELEXA) 20 MG tablet Take 10 mg by mouth daily.     [provider]  Guaifenesin (MUCINEX MAXIMUM STRENGTH) 1200 MG TB12 Take 1,200 mg by mouth 2 (two) times daily.    [provider]  ipratropium-albuterol (DUONEB) 0.5-2.5 (3) MG/3ML SOLN Take 3 mLs by nebulization every 6 (six) hours as needed. Patient taking differently: Take 3 mLs by nebulization every 6 (six) hours as needed (shortness of breath).  03/08/17   Javier Glazier, MD  losartan (COZAAR) 50 MG tablet Take 50 mg by mouth daily. 12/26/16   [provider]  omeprazole (PRILOSEC) 20 MG capsule Take 20 mg by mouth daily as needed (indigestion).     [provider]  polyethylene glycol (MIRALAX / GLYCOLAX) packet Take 17 g by mouth daily. Patient taking differently: Take 17 g by mouth daily as needed for mild constipation.  02/27/17   Lavina Hamman, MD  simvastatin (ZOCOR) 40 MG tablet Take 40 mg by mouth every evening.    [provider]  Spacer/Aero Chamber Mouthpiece MISC 1 Device by Does not apply route as directed. 03/08/17   Javier Glazier, MD     Allergies:     Allergies  Allergen Reactions  . Penicillins Hives    Has patient had a PCN reaction causing immediate rash, facial/tongue/throat swelling, SOB or lightheadedness with hypotension: no Has patient  had a PCN reaction causing severe rash involving mucus membranes or skin necrosis: no Has patient had a PCN reaction that required hospitalization: yes Has patient had a PCN reaction  occurring within the last 10 years: no If all of the above answers are "NO", then may proceed with Cephalosporin use.   . Tiotropium Bromide Monohydrate Other (See Comments)    EYE PAIN KIDNEY FUNCTION SLOWED DOWN     Physical Exam:   Vitals  Blood pressure (!) 135/54, pulse 80, temperature 97.9 F (36.6 C), temperature source Oral, resp. rate 19, height 5' (1.524 m), weight 56.2 kg (124 lb), SpO2 94 %.   1. General Pleasant middle-aged white female lying in bed in NAD, her face is swollen according to her it has follow-up about 50% in the last few weeks, upper chest wall, by lateral upper extremity veins are more prominent, she is slightly flushed in the face.  In no distress and comfortable.  2. Normal affect and insight, Not Suicidal or Homicidal, Awake Alert, Oriented X 3.  3. No F.N deficits, ALL C.Nerves Intact, Strength 5/5 all 4 extremities, Sensation intact all 4 extremities, Plantars down going.  4. Ears and Eyes appear Normal, Conjunctivae clear, PERRLA. Moist Oral Mucosa.  5. Supple Neck, No JVD, No cervical lymphadenopathy appriciated, No Carotid Bruits.  6. Symmetrical Chest wall movement, Good air movement bilaterally, CTAB.  7. RRR, No Gallops, Rubs or Murmurs, No Parasternal Heave.  8. Positive Bowel Sounds, Abdomen Soft, No tenderness, No organomegaly appriciated,No rebound -guarding or rigidity.  9.  No Cyanosis, Normal Skin Turgor, No Skin Rash or Bruise.  Trace edema in upper extremities.  10. Good muscle tone,  joints appear normal , no effusions, Normal ROM.  11. No Palpable Lymph Nodes in Neck or Axillae      Data Review:    CBC  Recent Labs Lab 03/26/17 1309  WBC 8.0  HGB 12.9  HCT 38.9  PLT 284  MCV 96.0  MCH 31.7  MCHC 33.1  RDW 13.1  LYMPHSABS 1.8  MONOABS 0.9  EOSABS 0.2  BASOSABS 0.1   ------------------------------------------------------------------------------------------------------------------  Chemistries   Recent  Labs Lab 03/26/17 1309  NA 136  K 5.1  CO2 28  GLUCOSE 170*  BUN 10.8  CREATININE 0.7  CALCIUM 9.7  AST 7  ALT 9  ALKPHOS 64  BILITOT 0.51   ------------------------------------------------------------------------------------------------------------------ estimated creatinine clearance is 52.2 mL/min (by C-G formula based on SCr of 0.7 mg/dL). ------------------------------------------------------------------------------------------------------------------ No results for input(s): TSH, T4TOTAL, T3FREE, THYROIDAB in the last 72 hours.  Invalid input(s): FREET3  Coagulation profile No results for input(s): INR, PROTIME in the last 168 hours. ------------------------------------------------------------------------------------------------------------------- No results for input(s): DDIMER in the last 72 hours. -------------------------------------------------------------------------------------------------------------------  Cardiac Enzymes No results for input(s): CKMB, TROPONINI, MYOGLOBIN in the last 168 hours.  Invalid input(s): CK ------------------------------------------------------------------------------------------------------------------    Component Value Date/Time   BNP 15.0 02/22/2017 2249     ---------------------------------------------------------------------------------------------------------------  Urinalysis    Component Value Date/Time   COLORURINE YELLOW 02/22/2017 2152   APPEARANCEUR CLEAR 02/22/2017 2152   LABSPEC 1.016 02/22/2017 2152   PHURINE 6.0 02/22/2017 2152   GLUCOSEU 150 (A) 02/22/2017 2152   HGBUR NEGATIVE 02/22/2017 2152   BILIRUBINUR NEGATIVE 02/22/2017 2152   [place] NEGATIVE 02/22/2017 2152   PROTEINUR NEGATIVE 02/22/2017 2152   NITRITE NEGATIVE 02/22/2017 2152   LEUKOCYTESUR TRACE (A) 02/22/2017 2152    ----------------------------------------------------------------------------------------------------------------    Imaging Results:    Nm Pet Image Initial (pi) Skull Base To  Thigh  Result Date: 03/25/2017 CLINICAL DATA:  Initial treatment strategy for lung mass. EXAM: NUCLEAR MEDICINE PET SKULL BASE TO THIGH TECHNIQUE: 6.3 MCi F-18 FDG was injected intravenously. Full-ring PET imaging was performed from the skull base to thigh after the radiotracer. CT data was obtained and used for attenuation correction and anatomic localization. FASTING BLOOD GLUCOSE:  Value: 180 mg/dl COMPARISON:  CT chest 02/23/2017 FINDINGS: NECK: No hypermetabolic lymph nodes in the neck. CHEST: Small right pleural effusion noted. The central right perihilar and right paratracheal lung mass exhibits intense FDG uptake with an SUV max equal to 6.28. There is occlusion of the right upper lobe bronchus with postobstructive consolidation and atelectasis which is now involving the entire right upper lobe. There is a cavitary lung nodule within the left upper lobe which measures 2.4 cm and has an SUV max equal to 9.73. No hypermetabolic sub- carinal, left-sided mediastinal or left hilar hypermetabolic adenopathy. ABDOMEN/PELVIS: No abnormal hypermetabolic activity within the liver, pancreas, adrenal glands, or spleen. Aortic atherosclerosis noted. No significant FDG uptake associated with the low-attenuation right adrenal nodule compatible with benign adenoma. No hypermetabolic lymph nodes in the abdomen or pelvis. SKELETON: No focal hypermetabolic activity to suggest skeletal metastasis. IMPRESSION: 1. Central obstructing right paratracheal and right hilar lung mass exhibits intense FDG uptake and there is associated complete atelectasis and consolidation of the right upper lobe. 2. Cavitary lung lesion within the left upper lobe is also hypermetabolic and may represent a focus of metastatic disease or a second primary lung neoplasm. 3. No evidence for distant hypermetabolic metastasis 4. Aortic atherosclerosis. Electronically Signed   By: Kerby Moors  M.D.   On: 03/25/2017 09:05   Obtain baseline EKG   Assessment & Plan:      1.  SVC syndrome.  Subacute, patient in no distress, no laryngeal edema or stridor.  Will be admitted to telemetry bed, IV Decadron, no IV fluids unless needed for hypotension etc., CT-guided lung biopsy to be scheduled for tomorrow to be done by IR physician Dr. Vernard Gambles Case discussed with him.  Also discussed the case with patient's oncology Dr. Julien Nordmann who has nothing to offer at this time.  Post biopsy we will consult IR to initiate radiation treatments which should be most meaningful.  2.  Right upper lobe lung mass and possibly a new left upper lobe mass on PET scan.  Plan as in #1 above.  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  3.  Free of smoking.  Quit 1 month ago.  Counseled to continue abstaining.  4.  DM type II.  Check A1c add sliding scale.  For now she wants to enjoy food we will keep her on regular diet.  5.  Hypertension.  As needed IV hydralazine, potassium was noted to be top normal will hold home dose ARB.  6.  HX of chronic diastolic CHF EF 52%.  On recent echogram.  Currently compensated.    DVT Prophylaxis Heparin   AM Labs Ordered, also please review Full Orders  Family Communication: Admission, patients condition and plan of care including tests being ordered have been discussed with the patient who indicates understanding and agree with the plan and Code Status.  Code Status Full  Likely DC to  Home 2-3 days  Condition GUARDED    Consults called: IR    Admission status: Inpt   Time spent in minutes : 35   Lala Lund M.D on 03/26/2017 at 5:15 PM  Between 7am to 7pm -  Pager - [phone] ( page via [facility], text pages only, please mention full 10 digit call back number).  After 7pm go to [email or web address] - password [facility]  [facility] - Office  [phone]

## 2017-03-26 NOTE — Progress Notes (Signed)
Pt has c/o ongoing head, face, neck chest swelling and tight feeling. Face does appear somewhat swollen as well as eyes.Noted numerous prominent superficial blood vessels across chest. Pt states they have gotten worse in the last week or 2.

## 2017-03-26 NOTE — Progress Notes (Signed)
Shona Simpson, PA phoned reports to [employer], RN on [place]. This RN wheeled patient to room 1409. Left patient in the hands of Amber, RN who denies additional needs or information. Patient in no distress when this RN exited.

## 2017-03-27 ENCOUNTER — Encounter (HOSPITAL_COMMUNITY): Payer: Self-pay | Admitting: Radiology

## 2017-03-27 ENCOUNTER — Encounter: Payer: Self-pay | Admitting: Oncology

## 2017-03-27 ENCOUNTER — Ambulatory Visit
Admit: 2017-03-27 | Discharge: 2017-03-27 | Disposition: A | Discharge status: Home / Self Care | Payer: Medicare Other | Attending: Radiation Oncology | Admitting: Radiation Oncology

## 2017-03-27 ENCOUNTER — Inpatient Hospital Stay (HOSPITAL_COMMUNITY): Payer: Medicare Other

## 2017-03-27 ENCOUNTER — Telehealth: Payer: Self-pay | Admitting: Oncology

## 2017-03-27 DIAGNOSIS — J439 Emphysema, unspecified: Secondary | ICD-10-CM

## 2017-03-27 DIAGNOSIS — R918 Other nonspecific abnormal finding of lung field: Secondary | ICD-10-CM | POA: Diagnosis not present

## 2017-03-27 DIAGNOSIS — E119 Type 2 diabetes mellitus without complications: Secondary | ICD-10-CM | POA: Diagnosis not present

## 2017-03-27 DIAGNOSIS — I871 Compression of vein: Principal | ICD-10-CM

## 2017-03-27 DIAGNOSIS — C3411 Malignant neoplasm of upper lobe, right bronchus or lung: Secondary | ICD-10-CM

## 2017-03-27 DIAGNOSIS — F17200 Nicotine dependence, unspecified, uncomplicated: Secondary | ICD-10-CM

## 2017-03-27 LAB — BASIC METABOLIC PANEL
Anion gap: 9 (ref 5–15)
BUN: 15 mg/dL (ref 6–20)
CALCIUM: 9 mg/dL (ref 8.9–10.3)
CO2: 27 mmol/L (ref 22–32)
CREATININE: 0.56 mg/dL (ref 0.44–1.00)
Chloride: 99 mmol/L — ABNORMAL LOW (ref 101–111)
GFR calc non Af Amer: >60 mL/min (ref >60)
Glucose, Bld: 254 mg/dL — ABNORMAL HIGH (ref 65–99)
Potassium: 4.4 mmol/L (ref 3.5–5.1)
SODIUM: 135 mmol/L (ref 135–145)

## 2017-03-27 LAB — CBC
HCT: 37.7 % (ref 36.0–46.0)
Hemoglobin: 12.4 g/dL (ref 12.0–15.0)
MCH: 31.5 pg (ref 26.0–34.0)
MCHC: 32.9 g/dL (ref 30.0–36.0)
MCV: 95.7 fL (ref 78.0–100.0)
PLATELETS: 254 10*3/uL (ref 150–400)
RBC: 3.94 MIL/uL (ref 3.87–5.11)
RDW: 12.8 % (ref 11.5–15.5)
WBC: 4.3 10*3/uL (ref 4.0–10.5)

## 2017-03-27 LAB — GLUCOSE, CAPILLARY
Glucose-Capillary: 239 mg/dL — ABNORMAL HIGH (ref 65–99)
Glucose-Capillary: 177 mg/dL — ABNORMAL HIGH (ref 65–99)

## 2017-03-27 MED ORDER — NALOXONE HCL 0.4 MG/ML IJ SOLN
INTRAMUSCULAR | Status: AC
  Filled 2017-03-27: qty 1

## 2017-03-27 MED ORDER — FENTANYL CITRATE (PF) 100 MCG/2ML IJ SOLN
INTRAMUSCULAR | Status: AC
  Filled 2017-03-27: qty 4

## 2017-03-27 MED ORDER — MIDAZOLAM HCL 2 MG/2ML IJ SOLN
INTRAMUSCULAR | Status: AC
  Filled 2017-03-27: qty 6

## 2017-03-27 MED ORDER — DEXAMETHASONE 4 MG PO TABS: 4.0000 mg | ORAL_TABLET | Freq: Two times a day (BID) | ORAL | 0 refills | Status: DC

## 2017-03-27 MED ORDER — MIDAZOLAM HCL 2 MG/2ML IJ SOLN
INTRAMUSCULAR | Status: AC | PRN
  Administered 2017-03-27 (×2): 1 mg via INTRAVENOUS

## 2017-03-27 MED ORDER — FLUMAZENIL 0.5 MG/5ML IV SOLN
INTRAVENOUS | Status: DC
Start: 2017-03-27 — End: 2017-03-27
  Filled 2017-03-27: qty 5

## 2017-03-27 MED ORDER — FENTANYL CITRATE (PF) 100 MCG/2ML IJ SOLN
INTRAMUSCULAR | Status: AC | PRN
  Administered 2017-03-27 (×2): 50 ug via INTRAVENOUS

## 2017-03-27 NOTE — Sedation Documentation (Signed)
Patient denies pain and is resting comfortably.  

## 2017-03-27 NOTE — Care Management Note (Signed)
Case Management Note  Patient Details  Name: DELAILA NAND MRN: [id number] Date of Birth: 12-May-1946  Subjective/Objective:                    Action/Plan:d/c home.   Expected Discharge Date:  03/27/17               Expected Discharge Plan:  Home/Self Care  In-House Referral:     Discharge planning Services  CM Consult  Post Acute Care Choice:    Choice offered to:     DME Arranged:    DME Agency:     HH Arranged:    HH Agency:     Status of Service:  Completed, signed off  If discussed at [employer] of Stay Meetings, dates discussed:    Additional Comments:  Dessa Phi, RN 03/27/2017, 3:23 PM

## 2017-03-27 NOTE — Progress Notes (Signed)
The patient was seen today in the simulation department for her planning session.  She is to undergo needle biopsy of the left upper lobe mass today around noon.  We will await final pathology confirmation to move forward and anticipate starting treatment tomorrow. I've also alerted path that we need a rush on her specimen for treatment timing.     Carola Rhine, PAC

## 2017-03-27 NOTE — Consult Note (Signed)
Chief Complaint: Patient was seen in consultation today for CT guided left lung mass biopsy   Referring Physician(s): Ashok Cordia, PA-C  Supervising Physician: Arne Cleveland  Patient Status: [facility] - In-pt  History of Present Illness: [name] is a 71 y.o. female , prior smoker, with history of COPD, diabetes, hypertension, osteoarthritis, hyperlipidemia and CHF who was recently admitted for suspected pneumonia and eventually noted to have right lung mass. 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 She has since undergone bronchoscopy with biopsies yielding atypical cells but not diagnostic of malignancy. Radiation planning is underway. PET scan on 03/25/17 revealed a central obstructing right peritracheal /right hilar lung mass with associated complete atelectasis and consolidation of the right upper lobe, cavitary lung lesion in the left upper lobe concerning for progressive metastatic disease versus second primary lung neoplasm. Request now received for CT-guided left lung mass biopsy for further evaluation.  Past Medical History:  Diagnosis Date  . Arthritis   . Asthma   . COPD (chronic obstructive pulmonary disease) ([place])   . Depression   . Diabetes mellitus    diet controlled  . History of hiatal hernia   . Hyperlipemia   . Hypertension   . Pneumonia     Past Surgical History:  Procedure Laterality Date  . CARDIAC CATHETERIZATION  1999  . CARPAL TUNNEL RELEASE  2/13   right-[facility]  . CARPAL TUNNEL RELEASE  08/15/2011   Procedure: CARPAL TUNNEL RELEASE;  Surgeon: Linna Hoff, MD;  Location: [facility];  Service: Orthopedics;  Laterality: Left;  . CATARACT EXTRACTION    . CERVICAL FUSION  2002  . COLONOSCOPY    . ENDOBRONCHIAL ULTRASOUND Bilateral 03/18/2017   Procedure: ENDOBRONCHIAL ULTRASOUND;  Surgeon: Javier Glazier, MD;  Location: [facility];  Service: Cardiopulmonary;  Laterality: Bilateral;  . TONSILLECTOMY    . VIDEO  BRONCHOSCOPY Bilateral 02/25/2017   Procedure: VIDEO BRONCHOSCOPY WITHOUT FLUORO;  Surgeon: Javier Glazier, MD;  Location: [facility];  Service: Cardiopulmonary;  Laterality: Bilateral;    Allergies: Penicillins and Tiotropium bromide monohydrate  Medications: Prior to Admission medications   Medication Sig Start Date End Date Taking? Authorizing Provider  acetaminophen (TYLENOL) 500 MG tablet Take 500 mg by mouth every 8 (eight) hours as needed for mild pain or headache.   Yes [provider]  aspirin 81 MG tablet Take 81 mg by mouth daily.   Yes [provider]  citalopram (CELEXA) 20 MG tablet Take 10 mg by mouth daily.    Yes [provider]  Guaifenesin (MUCINEX MAXIMUM STRENGTH) 1200 MG TB12 Take 1,200 mg by mouth 2 (two) times daily.   Yes [provider]  ipratropium-albuterol (DUONEB) 0.5-2.5 (3) MG/3ML SOLN Take 3 mLs by nebulization every 6 (six) hours as needed. Patient taking differently: Take 3 mLs by nebulization every 6 (six) hours as needed (shortness of breath).  03/08/17  Yes Javier Glazier, MD  losartan (COZAAR) 50 MG tablet Take 50 mg by mouth daily. 12/26/16  Yes [provider]  polyethylene glycol (MIRALAX / GLYCOLAX) packet Take 17 g by mouth daily. Patient taking differently: Take 17 g by mouth daily as needed for mild constipation.  02/27/17  Yes Lavina Hamman, MD  simvastatin (ZOCOR) 40 MG tablet Take 40 mg by mouth every evening.   Yes [provider]  Spacer/Aero Chamber Mouthpiece MISC 1 Device by Does not apply route as directed. 03/08/17   Javier Glazier, MD  Family History  Problem Relation Age of Onset  . Diabetes Mother   . Colon cancer Father   . Cerebral palsy Brother   . Breast cancer Paternal Aunt   . Breast cancer Cousin   . Prostate cancer Paternal Uncle   . Bone cancer Maternal Aunt     Social History   Social History  . Marital status: Married    Spouse name: N/A  .  Number of children: N/A  . Years of education: N/A   Social History Main Topics  . Smoking status: Former Smoker    Packs/day: 1.00    Years: 53.00    Start date: 12/22/1962    Quit date: 02/25/2017  . Smokeless tobacco: Never Used     Comment: Peak rate 1.5ppd - quit at most 6 months  . Alcohol use No  . Drug use: No  . Sexual activity: Not Currently   Other Topics Concern  . None   Social History Narrative   [facility] (02/23/17):   Originally from [place]. Previously has lived in [place] as well as [place]. Previously worked with a Engineer, structural and in Engineer, materials. Remote exposure to a parrot. Currently lives with her husband. Retired.      Review of Systems patient currently denies fever, headache, chest pain, abdominal pain, back pain,r abnormal bleeding. She does have occasional cough, dyspnea with exertion (on home O2), occ nausea and some sweats  Vital Signs: BP 128/65 (BP Location: Left Arm)   Pulse 75   Temp 98 F (36.7 C) (Oral)   Resp 18   Ht 5' (1.524 m)   Wt 124 lb (56.2 kg)   SpO2 96%   BMI 24.22 kg/m   Physical Exam awake, alert. Chest with distant breath sounds bilaterally. Heart with tachycardic but regular rhythm; abdomen soft, positive bowel sounds, nontender; no LE  edema; soft tissue fullness of ant neck with engorged veins  Imaging: Ct Angio Head W Or Wo Contrast  Result Date: 03/15/2017 CLINICAL DATA:  Visual loss and throat swelling over several weeks. EXAM: CT ANGIOGRAPHY HEAD AND NECK TECHNIQUE: Multidetector CT imaging of the head and neck was performed using the standard protocol during bolus administration of intravenous contrast. Multiplanar CT image reconstructions and MIPs were obtained to evaluate the vascular anatomy. Carotid stenosis measurements (when applicable) are obtained utilizing NASCET criteria, using the distal internal carotid diameter as the denominator. CONTRAST:  100 mL Isovue 370 COMPARISON:  Chest CT  02/23/2017. FINDINGS: CT HEAD FINDINGS Brain: There is no evidence of acute infarct, intracranial hemorrhage, mass, midline shift, or extra-axial fluid collection. The ventricles and sulci are within normal limits for age. Periventricular white matter hypodensities are nonspecific but compatible with minimal chronic small vessel ischemic disease. Vascular: Calcified atherosclerosis at the skullbase. Skull: No fracture or focal osseous lesion. Sinuses: Minimal right ethmoid air cell mucosal thickening. Clear mastoid air cells. Orbits: Bilateral cataract extraction. Review of the MIP images confirms the above findings CTA NECK FINDINGS Aortic arch: Standard 3 vessel aortic arch with moderate calcified plaque. Likely mild stenosis of the proximal brachiocephalic and left subclavian arteries. Limited assessment of the right subclavian artery due to calcified plaque and the adjacent dense venous contrast with resulting streak artifact. Right carotid system: Moderate calcified plaque at the carotid bifurcation and in the proximal ICA without significant common or internal carotid artery stenosis. Mild-to-moderate ECA origin stenosis. Left carotid system: Mild plaque at the carotid bifurcation without stenosis. Vertebral arteries: Patent with the left being dominant.  No evidence of significant stenosis. Skeleton: Congenital C2-3 fusion. C5-6 ACDF with solid fusion. C4-5 disc degeneration with bulging, annular calcification and uncovertebral spurring resulting in suspected moderate spinal stenosis and left greater than right neural foraminal stenosis. Other neck: Asymmetric effacement of the right piriform sinus by small volume mildly nodular soft tissue and possibly trace fluid. Mild diffuse subcutaneous and deep fat stranding/edema throughout the neck bilaterally. Mild retropharyngeal edema without fluid collection. Upper chest: Partially visualized right hilar/central right upper lobe mass as demonstrated on the prior  chest CT with increased right upper lobe collapse/ consolidation. Small right pleural effusion. SCC narrowing. Right upper lobe bronchus encasement. Review of the MIP images confirms the above findings CTA HEAD FINDINGS Anterior circulation: The internal carotid arteries are patent from skullbase to carotid termini. There is minimal carotid siphon atherosclerosis without significant stenosis. ACAs and MCAs are patent without evidence of proximal branch occlusion or significant stenosis. No aneurysm. Posterior circulation: The intracranial vertebral arteries are patent to the basilar. Patent PICA and SCA origins are identified bilaterally. The basilar artery is patent and mildly small in caliber diffusely on a congenital basis. There are patent posterior communicating arteries with hypoplastic P1 segments bilaterally. No significant proximal PCA stenosis. No aneurysm. Venous sinuses: Patent. Anatomic variants: Hypoplastic P1 segments. Delayed phase: No abnormal enhancement. Review of the MIP images confirms the above findings IMPRESSION: 1. No large vessel occlusion or significant intracranial arterial stenosis. 2. Right greater than left cervical carotid artery atherosclerosis without significant common or internal carotid artery stenosis. Mild-to-moderate right ECA origin stenosis. 3. Aortic Atherosclerosis (ICD10-I70.0). Mild proximal brachiocephalic and left subclavian artery stenosis. 4. Partially visualized right hilar mass as seen on recent chest CT with progressive right upper lobe collapse/consolidation. SVC narrowing with multiple collateral vessels. 5. Mild diffuse edema in the neck which may be secondary to SVC obstruction. 6. Asymmetric, mildly nodular soft tissue at the right tongue base. Consider direct visualization. 7. No evidence of acute intracranial abnormality or intracranial metastases. Electronically Signed   By: Logan Bores M.D.   On: 03/15/2017 09:29   Ct Angio Neck W Or Wo  Contrast  Result Date: 03/15/2017 CLINICAL DATA:  Visual loss and throat swelling over several weeks. EXAM: CT ANGIOGRAPHY HEAD AND NECK TECHNIQUE: Multidetector CT imaging of the head and neck was performed using the standard protocol during bolus administration of intravenous contrast. Multiplanar CT image reconstructions and MIPs were obtained to evaluate the vascular anatomy. Carotid stenosis measurements (when applicable) are obtained utilizing NASCET criteria, using the distal internal carotid diameter as the denominator. CONTRAST:  100 mL Isovue 370 COMPARISON:  Chest CT 02/23/2017. FINDINGS: CT HEAD FINDINGS Brain: There is no evidence of acute infarct, intracranial hemorrhage, mass, midline shift, or extra-axial fluid collection. The ventricles and sulci are within normal limits for age. Periventricular white matter hypodensities are nonspecific but compatible with minimal chronic small vessel ischemic disease. Vascular: Calcified atherosclerosis at the skullbase. Skull: No fracture or focal osseous lesion. Sinuses: Minimal right ethmoid air cell mucosal thickening. Clear mastoid air cells. Orbits: Bilateral cataract extraction. Review of the MIP images confirms the above findings CTA NECK FINDINGS Aortic arch: Standard 3 vessel aortic arch with moderate calcified plaque. Likely mild stenosis of the proximal brachiocephalic and left subclavian arteries. Limited assessment of the right subclavian artery due to calcified plaque and the adjacent dense venous contrast with resulting streak artifact. Right carotid system: Moderate calcified plaque at the carotid bifurcation and in the proximal ICA without significant common or  internal carotid artery stenosis. Mild-to-moderate ECA origin stenosis. Left carotid system: Mild plaque at the carotid bifurcation without stenosis. Vertebral arteries: Patent with the left being dominant. No evidence of significant stenosis. Skeleton: Congenital C2-3 fusion. C5-6 ACDF  with solid fusion. C4-5 disc degeneration with bulging, annular calcification and uncovertebral spurring resulting in suspected moderate spinal stenosis and left greater than right neural foraminal stenosis. Other neck: Asymmetric effacement of the right piriform sinus by small volume mildly nodular soft tissue and possibly trace fluid. Mild diffuse subcutaneous and deep fat stranding/edema throughout the neck bilaterally. Mild retropharyngeal edema without fluid collection. Upper chest: Partially visualized right hilar/central right upper lobe mass as demonstrated on the prior chest CT with increased right upper lobe collapse/ consolidation. Small right pleural effusion. SCC narrowing. Right upper lobe bronchus encasement. Review of the MIP images confirms the above findings CTA HEAD FINDINGS Anterior circulation: The internal carotid arteries are patent from skullbase to carotid termini. There is minimal carotid siphon atherosclerosis without significant stenosis. ACAs and MCAs are patent without evidence of proximal branch occlusion or significant stenosis. No aneurysm. Posterior circulation: The intracranial vertebral arteries are patent to the basilar. Patent PICA and SCA origins are identified bilaterally. The basilar artery is patent and mildly small in caliber diffusely on a congenital basis. There are patent posterior communicating arteries with hypoplastic P1 segments bilaterally. No significant proximal PCA stenosis. No aneurysm. Venous sinuses: Patent. Anatomic variants: Hypoplastic P1 segments. Delayed phase: No abnormal enhancement. Review of the MIP images confirms the above findings IMPRESSION: 1. No large vessel occlusion or significant intracranial arterial stenosis. 2. Right greater than left cervical carotid artery atherosclerosis without significant common or internal carotid artery stenosis. Mild-to-moderate right ECA origin stenosis. 3. Aortic Atherosclerosis (ICD10-I70.0). Mild proximal  brachiocephalic and left subclavian artery stenosis. 4. Partially visualized right hilar mass as seen on recent chest CT with progressive right upper lobe collapse/consolidation. SVC narrowing with multiple collateral vessels. 5. Mild diffuse edema in the neck which may be secondary to SVC obstruction. 6. Asymmetric, mildly nodular soft tissue at the right tongue base. Consider direct visualization. 7. No evidence of acute intracranial abnormality or intracranial metastases. Electronically Signed   By: Logan Bores M.D.   On: 03/15/2017 09:29   Nm Pet Image Initial (pi) Skull Base To Thigh  Result Date: 03/25/2017 CLINICAL DATA:  Initial treatment strategy for lung mass. EXAM: NUCLEAR MEDICINE PET SKULL BASE TO THIGH TECHNIQUE: 6.3 MCi F-18 FDG was injected intravenously. Full-ring PET imaging was performed from the skull base to thigh after the radiotracer. CT data was obtained and used for attenuation correction and anatomic localization. FASTING BLOOD GLUCOSE:  Value: 180 mg/dl COMPARISON:  CT chest 02/23/2017 FINDINGS: NECK: No hypermetabolic lymph nodes in the neck. CHEST: Small right pleural effusion noted. The central right perihilar and right paratracheal lung mass exhibits intense FDG uptake with an SUV max equal to 6.28. There is occlusion of the right upper lobe bronchus with postobstructive consolidation and atelectasis which is now involving the entire right upper lobe. There is a cavitary lung nodule within the left upper lobe which measures 2.4 cm and has an SUV max equal to 9.73. No hypermetabolic sub- carinal, left-sided mediastinal or left hilar hypermetabolic adenopathy. ABDOMEN/PELVIS: No abnormal hypermetabolic activity within the liver, pancreas, adrenal glands, or spleen. Aortic atherosclerosis noted. No significant FDG uptake associated with the low-attenuation right adrenal nodule compatible with benign adenoma. No hypermetabolic lymph nodes in the abdomen or pelvis. SKELETON: No focal  hypermetabolic activity  to suggest skeletal metastasis. IMPRESSION: 1. Central obstructing right paratracheal and right hilar lung mass exhibits intense FDG uptake and there is associated complete atelectasis and consolidation of the right upper lobe. 2. Cavitary lung lesion within the left upper lobe is also hypermetabolic and may represent a focus of metastatic disease or a second primary lung neoplasm. 3. No evidence for distant hypermetabolic metastasis 4. Aortic atherosclerosis. Electronically Signed   By: Kerby Moors M.D.   On: 03/25/2017 09:05   Dg Chest Port 1 View  Result Date: 03/18/2017 CLINICAL DATA:  Status post lymph node biopsy EXAM: PORTABLE CHEST 1 VIEW COMPARISON:  Chest radiograph February 22, 2017 and chest CT February 23, 2017 FINDINGS: No demonstrable pneumothorax. There is volume loss the right with airspace consolidation in the right upper lobe. The area of apparent mass in the right upper lobe is not well seen by radiography. Adenopathy is noted in the right hilar region. The left lung is clear except for mild left midlung and left base atelectatic change. Heart size is normal. There is aortic atherosclerosis. There is postoperative change in the lower cervical spine. IMPRESSION: No pneumothorax. Opacity with volume loss right upper lobe. Previously noted masslike area right upper lobe not well seen currently. There is adenopathy in the right hilar region. No edema or consolidation on the left. Areas of atelectasis noted in left mid lung and left base. There is aortic atherosclerosis. Aortic Atherosclerosis (ICD10-I70.0). Electronically Signed   By: Lowella Grip III M.D.   On: 03/18/2017 10:06    Labs:  CBC:  Recent Labs  03/07/17 1530 03/26/17 1309 03/26/17 1728 03/27/17 0522  WBC 7.8 8.0 8.2 4.3  HGB 12.1 12.9 12.1 12.4  HCT 37.0 38.9 37.0 37.7  PLT 436* 284 264 254    COAGS:  Recent Labs  02/24/17 0559 03/26/17 1728  INR 1.13 1.06     BMP:  Recent Labs  02/24/17 0559 02/25/17 0450 02/26/17 0452 03/07/17 1530 03/26/17 1309 03/26/17 1728 03/27/17 0522  NA 135 137 134* 138 136  --  135  K 4.4 4.3 4.1 4.3 5.1  --  4.4  CL 101 102 97*  --   --   --  99*  CO2 25 28 30  31* 28  --  27  GLUCOSE 171* 87 110* 125 170*  --  254*  BUN 8 7 7  9.2 10.8  --  15  CALCIUM 8.4* 8.5* 8.4* 9.8 9.7  --  9.0  CREATININE 0.43* 0.41* 0.41* 0.8 0.7 0.53 0.56  GFRNONAA >60 >60 >60  --   --  >60 >60  GFRAA >60 >60 >60  --   --  >60 >60    LIVER FUNCTION TESTS:  Recent Labs  02/24/17 0559 03/07/17 1530 03/26/17 1309  BILITOT 0.4 <0.22 0.51  AST 6* 6 7  ALT 9* 7 9  ALKPHOS 48 62 64  PROT 6.0* 6.4 6.6  ALBUMIN 2.7* 2.7* 3.2*    TUMOR MARKERS: No results for input(s): AFPTM, CEA, CA199, CHROMGRNA in the last 8760 hours.  Assessment and Plan: 71 year old white female, former smoker with COPD, hypertension, diabetes, osteoarthritis, hyperlipidemia, CHF, recent admission for pneumonia with subsequent finding of both right and left lung masses; prior bronchoscopy with atypical cells but nondiagnostic for malignancy. PET scan reveals central obstructing right paratracheal/ right hilar lung mass with complete atelectasis /consolidation right upper lobe, hypermetabolic cavitary left upper lobe lung lesion. Request received for CT-guided left lung mass biopsy.Risks and benefits discussed with the  patient/spouse including, but not limited to bleeding, infection, damage to adjacent structures, possible chest tube placement ,low yield requiring additional tests or death. All of the patient's questions were answered, patient is agreeable to proceed.Consent signed and in chart. Procedure planned for today.     Thank you for this interesting consult.  I greatly enjoyed meeting [name] and look forward to participating in their care.  A copy of this report was sent to the requesting provider on this date.  Electronically Signed: D.  Rowe Robert, PA-C 03/27/2017, 10:29 AM   I spent a total of 30 minutes  in face to face in clinical consultation, greater than 50% of which was counseling/coordinating care for CT-guided left lung mass biopsy

## 2017-03-27 NOTE — Progress Notes (Signed)
MD updated with pt status. Hold  Dose heparin. Vss and charted.

## 2017-03-27 NOTE — Progress Notes (Signed)
Pt discharged with all belongings. Able to teachback DC instructions. Denies pain or discomfort.

## 2017-03-27 NOTE — Telephone Encounter (Signed)
Scheduled appt per 10/30 los - left message with appt date and time.

## 2017-03-27 NOTE — Care Management Note (Signed)
Case Management Note  Patient Details  Name: [name] MRN: [id number] Date of Birth: December 03, 1945  Subjective/Objective: 71 y/o f admitted w/Superior vena cava obstruction. Hx: Lung Ca w/mets. Readmit-9/28-10/2. From home. Oncology/IR following.                   Action/Plan:d/c plan home.   Expected Discharge Date:   (unknown)               Expected Discharge Plan:  Home/Self Care  In-House Referral:     Discharge planning Services  CM Consult  Post Acute Care Choice:    Choice offered to:     DME Arranged:    DME Agency:     HH Arranged:    HH Agency:     Status of Service:  In process, will continue to follow  If discussed at Long Length of Stay Meetings, dates discussed:    Additional Comments:  Dessa Phi, RN 03/27/2017, 1:25 PM

## 2017-03-27 NOTE — Procedures (Addendum)
  Procedure: CT RUL lung core bx 18g x3 to surg path Preprocedure diagnosis: Lung mass Postprocedure diagnosis: same EBL:   minimal Complications:  Pleural effusion, stable on imaging, asymptomatic  See full dictation in [facility].  Dillard Cannon MD Main # [phone] Pager  [phone]

## 2017-03-27 NOTE — Discharge Summary (Signed)
Physician Discharge Summary  [name]:[id number] DOB: November 19, 1945 DOA: 03/26/2017  PCP: [name], MD  Admit date: 03/26/2017 Discharge date: 03/27/2017  Admitted From: Home Disposition: Home   Recommendations for Outpatient Follow-up:  1. Follow up with PCP, oncology, radiation oncology.  2. Please obtain BMP/CBC in one week 3. Please follow up on the following pending results: CT-guided biopsy results.   Home Health: None Equipment/Devices: None Discharge Condition: Stable CODE STATUS: Full Diet recommendation: Carb-modified  Brief/Interim Summary: [name]  is a 71 y.o. female, with a known history of right upper lobe lung mass suspected to be cancer, 2 unsuccessful Bronch-directed biopsy at times in the last few weeks, osteoarthritis, COPD/asthma, recently quit smoking, on home oxygen, DM type II, dyslipidemia and hypertension who was admitted about a month ago for suspected pneumonia and eventually found to have a right upper lobe mass.  This mass looked suspicious for malignancy, at that time pulmonary attempted twice via bronchoscopy to get a specimen but failed.  She followed with Dr. Julien Nordmann outpatient who had scheduled her for a radiation treatment today as she was developing mild SVC syndrome.  Patient presented to radiation office for possible initiation of radiation treatments however it was felt that it would be best to get some tissue diagnosis prior to starting treatment.  She was then transferred here for treatment of SVC syndrome and arranging CT-guided biopsy.  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  Surprisingly  she does not have any unexpected weight loss, chronic cough, no abdominal pain, no dysuria, no blood in stool or urine or focal weakness.   Discharge Diagnoses:  Principal Problem:   SVC (superior vena cava obstruction) Active Problems:   Tobacco use disorder   COPD (chronic obstructive pulmonary disease) (HCC)   Type 2 diabetes mellitus (HCC)   Mass of right lung   Vocal cord polyp   SVC syndrome  SVC syndrome.  Subacute, patient in no distress, no laryngeal edema or stridor.  - Continue steroids until radiation can be started, planning to begin <24 hours after discharge.  - CT-guided biopsy performed, results pending at discharge.   Right upper lobe lung mass and possibly a new left upper lobe mass on PET scan.   - Plan as in #1 above.  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  Free of smoking.  Quit 1 month ago.  Counseled to continue abstaining.  DM type II. Controlled with SSI as inpatient. Advised carb-modified diet.  Hypertension.  As needed IV hydralazine, potassium was noted to be top normal so held ARB. Restarted at DC.  HX of chronic diastolic CHF EF 01%.  On recent echogram.  Currently compensated.  Discharge Instructions Discharge Instructions    Diet - low sodium heart healthy    Complete by:  As directed    Diet Carb Modified    Complete by:  As directed    Discharge instructions    Complete by:  As directed    - Follow up with Dr. Lisbeth Renshaw as scheduled - Follow up in the [facility] as scheduled - Continue taking steroids, starting with decadron 4mg  twice daily at least  until you follow up with your outpatient providers. This may increase your blood sugar, so make sure to minimize carbohydrates and sugars in your diet.  - If you develop trouble breathing or feel like your throat is swelling/closing up, seek medical attention immediately.   Increase activity slowly    Complete by:  As directed      Allergies as of 03/27/2017      Reactions    Penicillins Hives   Has patient had a PCN reaction causing immediate rash, facial/tongue/throat swelling, SOB or lightheadedness with hypotension: no Has patient had a PCN reaction causing severe rash involving mucus membranes or skin necrosis: no Has patient had a PCN reaction that required hospitalization: yes Has patient had a PCN reaction occurring within the last 10 years: no If all of the above answers are "NO", then may proceed with Cephalosporin use.   Tiotropium Bromide Monohydrate Other (See Comments)   EYE PAIN KIDNEY FUNCTION SLOWED DOWN      Medication List    TAKE these medications   acetaminophen 500 MG tablet Commonly known as:  TYLENOL Take 500 mg by mouth every 8 (eight) hours as needed for mild pain or headache.   aspirin 81 MG tablet Take 81 mg by mouth daily.   citalopram 20 MG tablet Commonly known as:  CELEXA Take 10 mg by mouth daily.   dexamethasone 4 MG tablet Commonly known as:  DECADRON Take 1 tablet (4 mg total) by mouth 2 (two) times daily with a meal.   ipratropium-albuterol 0.5-2.5 (3) MG/3ML Soln Commonly known as:  DUONEB Take 3 mLs by nebulization every 6 (six) hours as needed. What changed:  reasons to take this   losartan 50 MG tablet Commonly known as:  COZAAR Take 50 mg by mouth daily.   MUCINEX MAXIMUM STRENGTH 1200 MG Tb12 Generic drug:  Guaifenesin Take 1,200 mg by mouth 2 (two) times daily.   polyethylene glycol packet Commonly known as:  MIRALAX / GLYCOLAX Take 17 g by mouth daily. What changed:  when to take this  reasons to take this   simvastatin 40 MG tablet Commonly known as:  ZOCOR Take 40 mg by mouth every evening.   Spacer/Aero Chamber Mouthpiece Misc 1 Device by Does not apply route as directed.      Follow-up Information    [name], MD Follow up.   Specialty:  Family Medicine Contact information: [place] [address] [phone]          Allergies  Allergen  Reactions  . Penicillins Hives    Has patient had a PCN reaction causing immediate rash, facial/tongue/throat swelling, SOB or lightheadedness with hypotension: no Has patient had a PCN reaction causing severe rash involving mucus membranes or skin necrosis: no Has patient had a PCN reaction that required hospitalization: yes Has patient had a PCN reaction occurring within the last 10 years: no If all of the above answers are "NO", then may proceed with Cephalosporin use.   . Tiotropium Bromide Monohydrate Other (See Comments)    EYE PAIN KIDNEY FUNCTION SLOWED DOWN    Consultations:  IR  Procedures/Studies: Ct Angio Head W Or Wo Contrast  Result Date: 03/15/2017 CLINICAL DATA:  Visual loss and throat swelling over several weeks. EXAM: CT ANGIOGRAPHY HEAD AND NECK TECHNIQUE: Multidetector CT imaging of the head and neck was performed using the standard protocol during bolus administration of intravenous contrast. Multiplanar CT image reconstructions and MIPs were obtained to evaluate the vascular  anatomy. Carotid stenosis measurements (when applicable) are obtained utilizing NASCET criteria, using the distal internal carotid diameter as the denominator. CONTRAST:  100 mL Isovue 370 COMPARISON:  Chest CT 02/23/2017. FINDINGS: CT HEAD FINDINGS Brain: There is no evidence of acute infarct, intracranial hemorrhage, mass, midline shift, or extra-axial fluid collection. The ventricles and sulci are within normal limits for age. Periventricular white matter hypodensities are nonspecific but compatible with minimal chronic small vessel ischemic disease. Vascular: Calcified atherosclerosis at the skullbase. Skull: No fracture or focal osseous lesion. Sinuses: Minimal right ethmoid air cell mucosal thickening. Clear mastoid air cells. Orbits: Bilateral cataract extraction. Review of the MIP images confirms the above findings CTA NECK FINDINGS Aortic arch: Standard 3 vessel aortic arch with moderate  calcified plaque. Likely mild stenosis of the proximal brachiocephalic and left subclavian arteries. Limited assessment of the right subclavian artery due to calcified plaque and the adjacent dense venous contrast with resulting streak artifact. Right carotid system: Moderate calcified plaque at the carotid bifurcation and in the proximal ICA without significant common or internal carotid artery stenosis. Mild-to-moderate ECA origin stenosis. Left carotid system: Mild plaque at the carotid bifurcation without stenosis. Vertebral arteries: Patent with the left being dominant. No evidence of significant stenosis. Skeleton: Congenital C2-3 fusion. C5-6 ACDF with solid fusion. C4-5 disc degeneration with bulging, annular calcification and uncovertebral spurring resulting in suspected moderate spinal stenosis and left greater than right neural foraminal stenosis. Other neck: Asymmetric effacement of the right piriform sinus by small volume mildly nodular soft tissue and possibly trace fluid. Mild diffuse subcutaneous and deep fat stranding/edema throughout the neck bilaterally. Mild retropharyngeal edema without fluid collection. Upper chest: Partially visualized right hilar/central right upper lobe mass as demonstrated on the prior chest CT with increased right upper lobe collapse/ consolidation. Small right pleural effusion. SCC narrowing. Right upper lobe bronchus encasement. Review of the MIP images confirms the above findings CTA HEAD FINDINGS Anterior circulation: The internal carotid arteries are patent from skullbase to carotid termini. There is minimal carotid siphon atherosclerosis without significant stenosis. ACAs and MCAs are patent without evidence of proximal branch occlusion or significant stenosis. No aneurysm. Posterior circulation: The intracranial vertebral arteries are patent to the basilar. Patent PICA and SCA origins are identified bilaterally. The basilar artery is patent and mildly small in  caliber diffusely on a congenital basis. There are patent posterior communicating arteries with hypoplastic P1 segments bilaterally. No significant proximal PCA stenosis. No aneurysm. Venous sinuses: Patent. Anatomic variants: Hypoplastic P1 segments. Delayed phase: No abnormal enhancement. Review of the MIP images confirms the above findings IMPRESSION: 1. No large vessel occlusion or significant intracranial arterial stenosis. 2. Right greater than left cervical carotid artery atherosclerosis without significant common or internal carotid artery stenosis. Mild-to-moderate right ECA origin stenosis. 3. Aortic Atherosclerosis (ICD10-I70.0). Mild proximal brachiocephalic and left subclavian artery stenosis. 4. Partially visualized right hilar mass as seen on recent chest CT with progressive right upper lobe collapse/consolidation. SVC narrowing with multiple collateral vessels. 5. Mild diffuse edema in the neck which may be secondary to SVC obstruction. 6. Asymmetric, mildly nodular soft tissue at the right tongue base. Consider direct visualization. 7. No evidence of acute intracranial abnormality or intracranial metastases. Electronically Signed   By: Logan Bores M.D.   On: 03/15/2017 09:29   Ct Angio Neck W Or Wo Contrast  Result Date: 03/15/2017 CLINICAL DATA:  Visual loss and throat swelling over several weeks. EXAM: CT ANGIOGRAPHY HEAD AND NECK TECHNIQUE: Multidetector CT imaging of the  head and neck was performed using the standard protocol during bolus administration of intravenous contrast. Multiplanar CT image reconstructions and MIPs were obtained to evaluate the vascular anatomy. Carotid stenosis measurements (when applicable) are obtained utilizing NASCET criteria, using the distal internal carotid diameter as the denominator. CONTRAST:  100 mL Isovue 370 COMPARISON:  Chest CT 02/23/2017. FINDINGS: CT HEAD FINDINGS Brain: There is no evidence of acute infarct, intracranial hemorrhage, mass, midline  shift, or extra-axial fluid collection. The ventricles and sulci are within normal limits for age. Periventricular white matter hypodensities are nonspecific but compatible with minimal chronic small vessel ischemic disease. Vascular: Calcified atherosclerosis at the skullbase. Skull: No fracture or focal osseous lesion. Sinuses: Minimal right ethmoid air cell mucosal thickening. Clear mastoid air cells. Orbits: Bilateral cataract extraction. Review of the MIP images confirms the above findings CTA NECK FINDINGS Aortic arch: Standard 3 vessel aortic arch with moderate calcified plaque. Likely mild stenosis of the proximal brachiocephalic and left subclavian arteries. Limited assessment of the right subclavian artery due to calcified plaque and the adjacent dense venous contrast with resulting streak artifact. Right carotid system: Moderate calcified plaque at the carotid bifurcation and in the proximal ICA without significant common or internal carotid artery stenosis. Mild-to-moderate ECA origin stenosis. Left carotid system: Mild plaque at the carotid bifurcation without stenosis. Vertebral arteries: Patent with the left being dominant. No evidence of significant stenosis. Skeleton: Congenital C2-3 fusion. C5-6 ACDF with solid fusion. C4-5 disc degeneration with bulging, annular calcification and uncovertebral spurring resulting in suspected moderate spinal stenosis and left greater than right neural foraminal stenosis. Other neck: Asymmetric effacement of the right piriform sinus by small volume mildly nodular soft tissue and possibly trace fluid. Mild diffuse subcutaneous and deep fat stranding/edema throughout the neck bilaterally. Mild retropharyngeal edema without fluid collection. Upper chest: Partially visualized right hilar/central right upper lobe mass as demonstrated on the prior chest CT with increased right upper lobe collapse/ consolidation. Small right pleural effusion. SCC narrowing. Right upper  lobe bronchus encasement. Review of the MIP images confirms the above findings CTA HEAD FINDINGS Anterior circulation: The internal carotid arteries are patent from skullbase to carotid termini. There is minimal carotid siphon atherosclerosis without significant stenosis. ACAs and MCAs are patent without evidence of proximal branch occlusion or significant stenosis. No aneurysm. Posterior circulation: The intracranial vertebral arteries are patent to the basilar. Patent PICA and SCA origins are identified bilaterally. The basilar artery is patent and mildly small in caliber diffusely on a congenital basis. There are patent posterior communicating arteries with hypoplastic P1 segments bilaterally. No significant proximal PCA stenosis. No aneurysm. Venous sinuses: Patent. Anatomic variants: Hypoplastic P1 segments. Delayed phase: No abnormal enhancement. Review of the MIP images confirms the above findings IMPRESSION: 1. No large vessel occlusion or significant intracranial arterial stenosis. 2. Right greater than left cervical carotid artery atherosclerosis without significant common or internal carotid artery stenosis. Mild-to-moderate right ECA origin stenosis. 3. Aortic Atherosclerosis (ICD10-I70.0). Mild proximal brachiocephalic and left subclavian artery stenosis. 4. Partially visualized right hilar mass as seen on recent chest CT with progressive right upper lobe collapse/consolidation. SVC narrowing with multiple collateral vessels. 5. Mild diffuse edema in the neck which may be secondary to SVC obstruction. 6. Asymmetric, mildly nodular soft tissue at the right tongue base. Consider direct visualization. 7. No evidence of acute intracranial abnormality or intracranial metastases. Electronically Signed   By: Logan Bores M.D.   On: 03/15/2017 09:29   Nm Pet Image Initial (pi) Skull Base  To Thigh  Result Date: 03/25/2017 CLINICAL DATA:  Initial treatment strategy for lung mass. EXAM: NUCLEAR MEDICINE PET  SKULL BASE TO THIGH TECHNIQUE: 6.3 MCi F-18 FDG was injected intravenously. Full-ring PET imaging was performed from the skull base to thigh after the radiotracer. CT data was obtained and used for attenuation correction and anatomic localization. FASTING BLOOD GLUCOSE:  Value: 180 mg/dl COMPARISON:  CT chest 02/23/2017 FINDINGS: NECK: No hypermetabolic lymph nodes in the neck. CHEST: Small right pleural effusion noted. The central right perihilar and right paratracheal lung mass exhibits intense FDG uptake with an SUV max equal to 6.28. There is occlusion of the right upper lobe bronchus with postobstructive consolidation and atelectasis which is now involving the entire right upper lobe. There is a cavitary lung nodule within the left upper lobe which measures 2.4 cm and has an SUV max equal to 9.73. No hypermetabolic sub- carinal, left-sided mediastinal or left hilar hypermetabolic adenopathy. ABDOMEN/PELVIS: No abnormal hypermetabolic activity within the liver, pancreas, adrenal glands, or spleen. Aortic atherosclerosis noted. No significant FDG uptake associated with the low-attenuation right adrenal nodule compatible with benign adenoma. No hypermetabolic lymph nodes in the abdomen or pelvis. SKELETON: No focal hypermetabolic activity to suggest skeletal metastasis. IMPRESSION: 1. Central obstructing right paratracheal and right hilar lung mass exhibits intense FDG uptake and there is associated complete atelectasis and consolidation of the right upper lobe. 2. Cavitary lung lesion within the left upper lobe is also hypermetabolic and may represent a focus of metastatic disease or a second primary lung neoplasm. 3. No evidence for distant hypermetabolic metastasis 4. Aortic atherosclerosis. Electronically Signed   By: Kerby Moors M.D.   On: 03/25/2017 09:05   Ct Biopsy  Result Date: 03/27/2017 CLINICAL DATA:  Obstructing hypermetabolic right upper lobe mass. Bronchoscopic biopsies have been  nondiagnostic. Percutaneous biopsy is requested. Additional cavitary lingular lesion. EXAM: CT GUIDED CORE BIOPSY OF RIGHT UPPER LOBE LUNG MASS ANESTHESIA/SEDATION: Intravenous Fentanyl and Versed were administered as conscious sedation during continuous monitoring of the patient's level of consciousness and physiological / cardiorespiratory status by the radiology RN, with a total moderate sedation time of 11 minutes. PROCEDURE: The procedure risks, benefits, and alternatives were explained to the patient. Questions regarding the procedure were encouraged and answered. The patient understands and consents to the procedure. Select axial scans through the thorax were obtained. The dominant obstructing right lesion was localized with comparison to previous PET-CT using consistent anatomic landmarks. An appropriate skin entry site was determined and marked. The operative field was prepped with chlorhexidinein a sterile fashion, and a sterile drape was applied covering the operative field. A sterile gown and sterile gloves were used for the procedure. Local anesthesia was provided with 1% Lidocaine. Under CT fluoroscopic guidance, a 17 gauge trocar needle was advanced to the margin of the lesion. Once needle tip position was confirmed at the periphery of the lesion, multiple coaxial 18-gauge 3 cm core biopsy samples were obtained, submitted in formalin to surgical pathology. The guide needle was removed. Postprocedure scans show a small amount of gas in the pleural space anterior to the collapsed right upper lobe despite the fact that the needle did not traverse aerated lung, suggesting some gas was entrained through the guide needle during biopsy. This was stable on subsequent imaging over 13 minutes, although a developing right pleural effusion was identified. Patient remained asymptomatic and hemodynamically stable for additional 30 minutes of observation. The patient tolerated the procedure well. COMPLICATIONS:  Small pneumothorax and right pleural  effusion, asymptomatic. SIR level A: No therapy, no consequence. FINDINGS: The right upper lobe lesion was localized with comparison to recent PET-CT. Representative core biopsy samples were obtained. Small asymptomatic right pneumothorax and pleural effusion noted post biopsy. IMPRESSION: 1. Technically successful CT-guided core biopsy, right upper lobe lung lesion. Electronically Signed   By: Lucrezia Europe M.D.   On: 03/27/2017 12:55   Dg Chest Port 1 View  Result Date: 03/18/2017 CLINICAL DATA:  Status post lymph node biopsy EXAM: PORTABLE CHEST 1 VIEW COMPARISON:  Chest radiograph February 22, 2017 and chest CT February 23, 2017 FINDINGS: No demonstrable pneumothorax. There is volume loss the right with airspace consolidation in the right upper lobe. The area of apparent mass in the right upper lobe is not well seen by radiography. Adenopathy is noted in the right hilar region. The left lung is clear except for mild left midlung and left base atelectatic change. Heart size is normal. There is aortic atherosclerosis. There is postoperative change in the lower cervical spine. IMPRESSION: No pneumothorax. Opacity with volume loss right upper lobe. Previously noted masslike area right upper lobe not well seen currently. There is adenopathy in the right hilar region. No edema or consolidation on the left. Areas of atelectasis noted in left mid lung and left base. There is aortic atherosclerosis. Aortic Atherosclerosis (ICD10-I70.0). Electronically Signed   By: Lowella Grip III M.D.   On: 03/18/2017 10:06   Subjective: Breathing normally, no stridor or dyspnea or chest pain. Fullness in face, arms and chest seem to have lessened since admission yesterday. Tolerated XRT simulation this AM, has appointment tomorrow PM.   Discharge Exam: Vitals:   03/27/17 1248 03/27/17 1511  BP: 113/60 105/69  Pulse: 80 90  Resp: 16 17  Temp:  97.8 F (36.6 C)  SpO2: 94% 95%    General: Pt is alert, awake, not in acute distress Cardiovascular: RRR, S1/S2 +, no rubs, no gallops Respiratory: CTA bilaterally, no wheezing, no rhonchi. No stridor Abdominal: Soft, NT, ND, bowel sounds + Extremities: Mildly edematous upper extremities.  Skin: Scattered telangiectasias across upper chest without varicosities. Face swelling is subtle and improved per pt and husband.  Labs: Basic Metabolic Panel:  Recent Labs Lab 03/26/17 1309 03/26/17 1728 03/27/17 0522  NA 136  --  135  K 5.1  --  4.4  CL  --   --  99*  CO2 28  --  27  GLUCOSE 170*  --  254*  BUN 10.8  --  15  CREATININE 0.7 0.53 0.56  CALCIUM 9.7  --  9.0   Liver Function Tests:  Recent Labs Lab 03/26/17 1309  AST 7  ALT 9  ALKPHOS 64  BILITOT 0.51  PROT 6.6  ALBUMIN 3.2*   CBC:  Recent Labs Lab 03/26/17 1309 03/26/17 1728 03/27/17 0522  WBC 8.0 8.2 4.3  NEUTROABS 5.1  --   --   HGB 12.9 12.1 12.4  HCT 38.9 37.0 37.7  MCV 96.0 96.4 95.7  PLT 284 264 254   Hgb A1c  Recent Labs  03/26/17 1728  HGBA1C 7.9*   Thyroid function studies  Recent Labs  03/26/17 1728  TSH 1.996    Time coordinating discharge: Approximately 40 minutes  Vance Gather, MD  [facility] 03/27/2017, 3:21 PM Pager [phone]

## 2017-03-28 ENCOUNTER — Encounter: Payer: Self-pay | Admitting: *Deleted

## 2017-03-28 ENCOUNTER — Telehealth: Payer: Self-pay | Admitting: Radiation Oncology

## 2017-03-28 ENCOUNTER — Ambulatory Visit: Payer: Medicare Other | Admitting: Radiation Oncology

## 2017-03-28 ENCOUNTER — Ambulatory Visit
Admission: RE | Admit: 2017-03-28 | Discharge: 2017-03-28 | Disposition: A | Discharge status: Home / Self Care | Payer: Medicare Other | Source: Ambulatory Visit | Attending: Radiation Oncology | Admitting: Radiation Oncology

## 2017-03-28 DIAGNOSIS — M1990 Unspecified osteoarthritis, unspecified site: Secondary | ICD-10-CM | POA: Insufficient documentation

## 2017-03-28 DIAGNOSIS — Z833 Family history of diabetes mellitus: Secondary | ICD-10-CM | POA: Insufficient documentation

## 2017-03-28 DIAGNOSIS — J449 Chronic obstructive pulmonary disease, unspecified: Secondary | ICD-10-CM | POA: Insufficient documentation

## 2017-03-28 DIAGNOSIS — Z820 Family history of epilepsy and other diseases of the nervous system: Secondary | ICD-10-CM | POA: Diagnosis not present

## 2017-03-28 DIAGNOSIS — Z7982 Long term (current) use of aspirin: Secondary | ICD-10-CM | POA: Insufficient documentation

## 2017-03-28 DIAGNOSIS — Z87891 Personal history of nicotine dependence: Secondary | ICD-10-CM | POA: Diagnosis not present

## 2017-03-28 DIAGNOSIS — I10 Essential (primary) hypertension: Secondary | ICD-10-CM | POA: Insufficient documentation

## 2017-03-28 DIAGNOSIS — F329 Major depressive disorder, single episode, unspecified: Secondary | ICD-10-CM | POA: Insufficient documentation

## 2017-03-28 DIAGNOSIS — R918 Other nonspecific abnormal finding of lung field: Secondary | ICD-10-CM

## 2017-03-28 DIAGNOSIS — E785 Hyperlipidemia, unspecified: Secondary | ICD-10-CM | POA: Insufficient documentation

## 2017-03-28 DIAGNOSIS — E119 Type 2 diabetes mellitus without complications: Secondary | ICD-10-CM | POA: Insufficient documentation

## 2017-03-28 DIAGNOSIS — Z981 Arthrodesis status: Secondary | ICD-10-CM | POA: Insufficient documentation

## 2017-03-28 DIAGNOSIS — Z881 Allergy status to other antibiotic agents status: Secondary | ICD-10-CM | POA: Diagnosis not present

## 2017-03-28 DIAGNOSIS — Z803 Family history of malignant neoplasm of breast: Secondary | ICD-10-CM | POA: Insufficient documentation

## 2017-03-28 DIAGNOSIS — Z79899 Other long term (current) drug therapy: Secondary | ICD-10-CM | POA: Diagnosis not present

## 2017-03-28 DIAGNOSIS — Z510 Encounter for antineoplastic radiation therapy: Secondary | ICD-10-CM | POA: Diagnosis present

## 2017-03-28 DIAGNOSIS — Z880 Allergy status to penicillin: Secondary | ICD-10-CM | POA: Diagnosis not present

## 2017-03-28 DIAGNOSIS — C3411 Malignant neoplasm of upper lobe, right bronchus or lung: Secondary | ICD-10-CM | POA: Insufficient documentation

## 2017-03-28 DIAGNOSIS — Z8701 Personal history of pneumonia (recurrent): Secondary | ICD-10-CM | POA: Diagnosis not present

## 2017-03-28 MED ORDER — SONAFINE EX EMUL
1.0000 "application " | Freq: Two times a day (BID) | CUTANEOUS | Status: DC
  Administered 2017-03-28: 1 via TOPICAL

## 2017-03-28 NOTE — Telephone Encounter (Signed)
Per Dr. Julien Nordmann I will update pathology to send for foundation one and PDL 1 testing

## 2017-03-28 NOTE — Progress Notes (Signed)
SKIN CARE DURING RADIATION TREATMENT-Chest  RECOMMENDATIONS: ? Use unscented soap (Dove) ? When showering it is fine for  [facility] warm water to touch the area, but please avoid direct spray on the treatment field.  Also, wash inside and around the marked area ? When drying gently blot the area ? Avoid using lotions, oils or powders as well as products with alcohol ? If you shave, use an electric razor and DO NOT use pre or after shave lotion  ? Moisturizer o You may be given Sonafine (provided by nursing) to use. Apply twice daily, once after treatment and then again prior to bedtime o  ? PLEASE DO NOT APPLY ANYTHING TO THE TREATMENT AREA SKIN WITH 4 HOURS  PRIOR TO RADIATION Increase protein and calories in diet, may need to eat 5-6 smaller meals and ensure/boost between meals,  Sees MD weekly and prn, exercise, get plenty rest and sleep,  May have difficulty swallowing, throat burning, may need rx of carafte , fatigue, skin irritatin, sonag=fine moisturizes, and soothes the skin ,  Teach back given

## 2017-03-28 NOTE — Telephone Encounter (Signed)
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 We will move forward with treatment. Dr. Tresa Moore is going to perform additional stains. We also requested molecular studies as well for Dr. Julien Nordmann if there is enough tissue.

## 2017-03-28 NOTE — Progress Notes (Signed)
Oncology Nurse Navigator Documentation  Oncology Nurse Navigator Flowsheets 03/28/2017  Navigator Location [facility]  Navigator Encounter Type Other/per Dr. Julien Nordmann, I contacted pathology dept to send tissue for foundation one and PDL 1 testing.   Treatment Phase Pre-Tx/Tx Discussion  Barriers/Navigation Needs Coordination of Care  Interventions Coordination of Care  Coordination of Care Other  Acuity Level 2  Time Spent with Patient 30

## 2017-03-29 ENCOUNTER — Ambulatory Visit: Payer: Medicare Other

## 2017-03-29 ENCOUNTER — Ambulatory Visit
Admission: RE | Admit: 2017-03-29 | Discharge: 2017-03-29 | Disposition: A | Discharge status: Home / Self Care | Payer: Medicare Other | Source: Ambulatory Visit | Attending: Radiation Oncology | Admitting: Radiation Oncology

## 2017-03-29 DIAGNOSIS — Z510 Encounter for antineoplastic radiation therapy: Secondary | ICD-10-CM | POA: Diagnosis not present

## 2017-04-01 ENCOUNTER — Ambulatory Visit: Payer: Medicare Other

## 2017-04-01 ENCOUNTER — Ambulatory Visit
Admission: RE | Admit: 2017-04-01 | Discharge: 2017-04-01 | Disposition: A | Discharge status: Home / Self Care | Payer: Medicare Other | Source: Ambulatory Visit | Attending: Radiation Oncology | Admitting: Radiation Oncology

## 2017-04-01 DIAGNOSIS — Z510 Encounter for antineoplastic radiation therapy: Secondary | ICD-10-CM | POA: Diagnosis not present

## 2017-04-02 ENCOUNTER — Ambulatory Visit: Payer: Medicare Other

## 2017-04-02 ENCOUNTER — Telehealth: Payer: Self-pay | Admitting: *Deleted

## 2017-04-02 NOTE — Telephone Encounter (Signed)
Returned call to patient, she is on dexamethasone from 03/27/17 from hospital ist to take 4mg  bid ,30 tabs, lung cancer, continue as directed and will discuss further Friday after rad tx with Dr. Elmer Bales patient to also brush her tongue when brushing her teeth, ,patien thanked me for returning her call this am 10:00 AM

## 2017-04-03 ENCOUNTER — Ambulatory Visit: Payer: Medicare Other

## 2017-04-03 ENCOUNTER — Ambulatory Visit
Admission: RE | Admit: 2017-04-03 | Discharge: 2017-04-03 | Disposition: A | Discharge status: Home / Self Care | Payer: Medicare Other | Source: Ambulatory Visit | Attending: Radiation Oncology | Admitting: Radiation Oncology

## 2017-04-03 VITALS — BP 121/77 | HR 108 | Temp 97.8°F | Resp 20

## 2017-04-03 DIAGNOSIS — Z510 Encounter for antineoplastic radiation therapy: Secondary | ICD-10-CM | POA: Diagnosis not present

## 2017-04-03 DIAGNOSIS — R918 Other nonspecific abnormal finding of lung field: Secondary | ICD-10-CM

## 2017-04-03 DIAGNOSIS — I871 Compression of vein: Secondary | ICD-10-CM

## 2017-04-03 MED ORDER — ALRA NON-METALLIC DEODORANT (RAD-ONC)
1.0000 "application " | Freq: Once | TOPICAL | Status: AC
  Administered 2017-04-03: 1 via TOPICAL

## 2017-04-03 NOTE — Progress Notes (Addendum)
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

## 2017-04-04 ENCOUNTER — Ambulatory Visit: Payer: Medicare Other

## 2017-04-04 ENCOUNTER — Ambulatory Visit
Admission: RE | Admit: 2017-04-04 | Discharge: 2017-04-04 | Disposition: A | Discharge status: Home / Self Care | Payer: Medicare Other | Source: Ambulatory Visit | Attending: Radiation Oncology | Admitting: Radiation Oncology

## 2017-04-04 DIAGNOSIS — Z510 Encounter for antineoplastic radiation therapy: Secondary | ICD-10-CM | POA: Diagnosis not present

## 2017-04-05 ENCOUNTER — Ambulatory Visit
Admission: RE | Admit: 2017-04-05 | Discharge: 2017-04-05 | Disposition: A | Discharge status: Home / Self Care | Payer: Medicare Other | Source: Ambulatory Visit | Attending: Radiation Oncology | Admitting: Radiation Oncology

## 2017-04-05 ENCOUNTER — Encounter: Payer: Self-pay | Admitting: *Deleted

## 2017-04-05 ENCOUNTER — Ambulatory Visit: Payer: Medicare Other

## 2017-04-05 DIAGNOSIS — Z510 Encounter for antineoplastic radiation therapy: Secondary | ICD-10-CM | POA: Diagnosis not present

## 2017-04-05 NOTE — Progress Notes (Signed)
Radiation Oncology         [phone] ________________________________  Name: [name]        MRN: [id number]  Date of Service: 03/26/2017 DOB: [birth year]  FA:[id number], Lattie Haw, MD  Curt Bears, MD     REFERRING PHYSICIAN: Curt Bears, MD   DIAGNOSIS: The encounter diagnosis was Mass of right lung.   HISTORY OF PRESENT ILLNESS: [name] is a 71 y.o. female seen at the request of Dr. Ashok Cordia for a presumed lung cancer. The patient presented with cough and shortness of breath as well as chest pain for several months and was treated with few courses of antibiotics with no improvement. Chest x-ray was performed on 02/22/2017 revealed a right apical opacity. CT angiogram of the chest on 02/23/2017 revealed an ill-defined right apical mass like opacity measuring 3.5 x 3.1 cm with irregular spiculated borders and surrounding groundglass opacity. The spiculations and ground glass opacity extending to the pleural surface peripherally anteriorly. This is contiguous with right hilar soft tissue density that measured approximately 3.4 x 2.0 cm. There was postoperative obstructive atelectasis of the anterior right upper lobe. The right hilar mass causing significant narrowing of the right upper lobe bronchus with subsequent volume loss in the right lung and encases the right main pulmonary artery. There was also spiculated nodule measuring 2.1 x 1.8 cm within the anterior left upper lobe with central a are consistent with necrosis. There was also a lower paratracheal node measuring 0.7 cm in short axis and a prominent subcarinal node measuring approximately 1.2 cm. On 02/25/2017 the patient underwent bronchoscopy with airway any suspension and endobronchial brushings and lavage under the care of Dr. Ashok Cordia. The procedure showed left focal cord polyp in addition to the right upper lobe mass and postobstructive pneumonia. The final cytology ([id number]) of the bronchial lavage of the right upper lobe  showed atypical cells but was not sufficient for a diagnosis of malignancy. A orders  for CT biopsy but this has not yet been scheduled. She comes today to discuss options of radiotherapy.    PREVIOUS RADIATION THERAPY: No   PAST MEDICAL HISTORY:  Past Medical History:  Diagnosis Date  . Arthritis   . Asthma   . COPD (chronic obstructive pulmonary disease) ([place])   . Depression   . Diabetes mellitus    diet controlled  . History of hiatal hernia   . Hyperlipemia   . Hypertension   . Pneumonia        PAST SURGICAL HISTORY: Past Surgical History:  Procedure Laterality Date  . CARDIAC CATHETERIZATION  1999  . CARPAL TUNNEL RELEASE  2/13   right-GSC  . CATARACT EXTRACTION    . CERVICAL FUSION  2002  . COLONOSCOPY    . TONSILLECTOMY       FAMILY HISTORY:  Family History  Problem Relation Age of Onset  . Diabetes Mother   . Colon cancer Father   . Cerebral palsy Brother   . Breast cancer Paternal Aunt   . Breast cancer Cousin   . Prostate cancer Paternal Uncle   . Bone cancer Maternal Aunt      SOCIAL HISTORY:  reports that she quit smoking about 5 weeks ago. She started smoking about 54 years ago. She has a 53.00 pack-year smoking history. she has never used smokeless tobacco. She reports that she does not drink alcohol or use drugs. The patient is married and lives in [place].   ALLERGIES: Penicillins and Tiotropium bromide monohydrate  MEDICATIONS:  Current Outpatient Medications  Medication Sig Dispense Refill  . acetaminophen (TYLENOL) 500 MG tablet Take 500 mg by mouth every 8 (eight) hours as needed for mild pain or headache.    Marland Kitchen aspirin 81 MG tablet Take 81 mg by mouth daily.    . citalopram (CELEXA) 20 MG tablet Take 10 mg by mouth daily.     Marland Kitchen dexamethasone (DECADRON) 4 MG tablet Take 1 tablet (4 mg total) by mouth 2 (two) times daily with a meal. 30 tablet 0  . Guaifenesin (MUCINEX MAXIMUM STRENGTH) 1200 MG TB12 Take 1,200 mg by mouth 2 (two) times  daily.    Marland Kitchen ipratropium-albuterol (DUONEB) 0.5-2.5 (3) MG/3ML SOLN Take 3 mLs by nebulization every 6 (six) hours as needed. (Patient taking differently: Take 3 mLs by nebulization every 6 (six) hours as needed (shortness of breath). ) 120 mL 3  . losartan (COZAAR) 50 MG tablet Take 50 mg by mouth daily.  0  . non-metallic deodorant (ALRA) MISC Apply 1 application topically.    . polyethylene glycol (MIRALAX / GLYCOLAX) packet Take 17 g by mouth daily. (Patient taking differently: Take 17 g by mouth daily as needed for mild constipation. ) 14 each 0  . simvastatin (ZOCOR) 40 MG tablet Take 40 mg by mouth every evening.    Marland Kitchen Spacer/Aero Chamber Mouthpiece MISC 1 Device by Does not apply route as directed. 1 each 0  . Wound Dressings (SONAFINE) Apply 1 application topically 2 (two) times daily. Apply after rad txs and at bedtimes daily,nothing 4 hours prior to rad tx     No current facility-administered medications for this encounter.      REVIEW OF SYSTEMS: On review of systems, the patient reports that she is having a hard time with breathing comforably and describes orthopnea. She continues to have a productive cough but denies any fevers, and states she is not having hemoptysis.  She has had about 15 pounds of unintended weight change. She denies any bowel or bladder disturbances, and denies abdominal pain, nausea or vomiting. She denies any new musculoskeletal or joint aches or pains. A complete review of systems is obtained and is otherwise negative.     PHYSICAL EXAM:  Wt Readings from Last 3 Encounters:  03/26/17 124 lb (56.2 kg)  03/26/17 124 lb 8 oz (56.5 kg)  03/18/17 123 lb (55.8 kg)   Temp Readings from Last 3 Encounters:  04/03/17 97.8 F (36.6 C) (Core)  03/27/17 97.8 F (36.6 C) (Oral)  03/26/17 97.6 F (36.4 C) (Oral)   BP Readings from Last 3 Encounters:  04/03/17 121/77  03/27/17 105/69  03/26/17 (!) 106/30   Pulse Readings from Last 3 Encounters:  04/03/17 (!)  108  03/27/17 90  03/26/17 98     In general this is a ill appearing caucasian female in no acute distress. She is alert and oriented x4 and appropriate throughout the examination. HEENT reveals that the patient is normocephalic, atraumatic. EOMs are intact. PERRLA. Skin is intact without any evidence of gross lesions. Cardiovascular exam reveals a tachycardic rate and regular rhythm, no clicks rubs or murmurs are auscultated. Chest is clear to auscultation bilaterally. Her chest wall has stigmata of collateral congestion with multiple veins noted along her upper chest and at the base of the neck. Lymphatic assessment is performed and does not reveal any adenopathy in the cervical, supraclavicular, axillary, or inguinal chains. Abdomen has active bowel sounds in all quadrants and is intact. The abdomen is soft,  non tender, non distended. Lower extremities are negative for pretibial pitting edema, deep calf tenderness, cyanosis or clubbing.   ECOG = 2  0 - Asymptomatic (Fully active, able to carry on all predisease activities without restriction)  1 - Symptomatic but completely ambulatory (Restricted in physically strenuous activity but ambulatory and able to carry out work of a light or sedentary nature. For example, light housework, office work)  2 - Symptomatic, <50% in bed during the day (Ambulatory and capable of all self care but unable to carry out any work activities. Up and about more than 50% of waking hours)  3 - Symptomatic, >50% in bed, but not bedbound (Capable of only limited self-care, confined to bed or chair 50% or more of waking hours)  4 - Bedbound (Completely disabled. Cannot carry on any self-care. Totally confined to bed or chair)  5 - Death   Eustace Pen MM, Creech RH, Tormey DC, et al. [phone]). "Toxicity and response criteria of the [facility]". [place] Oncol. 5 (6): 649-55    LABORATORY DATA:  Lab Results  Component Value Date   WBC 4.3  03/27/2017   HGB 12.4 03/27/2017   HCT 37.7 03/27/2017   MCV 95.7 03/27/2017   PLT 254 03/27/2017   Lab Results  Component Value Date   NA 135 03/27/2017   K 4.4 03/27/2017   CL 99 (L) 03/27/2017   CO2 27 03/27/2017   Lab Results  Component Value Date   ALT 9 03/26/2017   AST 7 03/26/2017   ALKPHOS 64 03/26/2017   BILITOT 0.51 03/26/2017      RADIOGRAPHY: Ct Angio Head W Or Wo Contrast  Result Date: 03/15/2017 CLINICAL DATA:  Visual loss and throat swelling over several weeks. EXAM: CT ANGIOGRAPHY HEAD AND NECK TECHNIQUE: Multidetector CT imaging of the head and neck was performed using the standard protocol during bolus administration of intravenous contrast. Multiplanar CT image reconstructions and MIPs were obtained to evaluate the vascular anatomy. Carotid stenosis measurements (when applicable) are obtained utilizing NASCET criteria, using the distal internal carotid diameter as the denominator. CONTRAST:  100 mL Isovue 370 COMPARISON:  Chest CT 02/23/2017. FINDINGS: CT HEAD FINDINGS Brain: There is no evidence of acute infarct, intracranial hemorrhage, mass, midline shift, or extra-axial fluid collection. The ventricles and sulci are within normal limits for age. Periventricular white matter hypodensities are nonspecific but compatible with minimal chronic small vessel ischemic disease. Vascular: Calcified atherosclerosis at the skullbase. Skull: No fracture or focal osseous lesion. Sinuses: Minimal right ethmoid air cell mucosal thickening. Clear mastoid air cells. Orbits: Bilateral cataract extraction. Review of the MIP images confirms the above findings CTA NECK FINDINGS Aortic arch: Standard 3 vessel aortic arch with moderate calcified plaque. Likely mild stenosis of the proximal brachiocephalic and left subclavian arteries. Limited assessment of the right subclavian artery due to calcified plaque and the adjacent dense venous contrast with resulting streak artifact. Right carotid  system: Moderate calcified plaque at the carotid bifurcation and in the proximal ICA without significant common or internal carotid artery stenosis. Mild-to-moderate ECA origin stenosis. Left carotid system: Mild plaque at the carotid bifurcation without stenosis. Vertebral arteries: Patent with the left being dominant. No evidence of significant stenosis. Skeleton: Congenital C2-3 fusion. C5-6 ACDF with solid fusion. C4-5 disc degeneration with bulging, annular calcification and uncovertebral spurring resulting in suspected moderate spinal stenosis and left greater than right neural foraminal stenosis. Other neck: Asymmetric effacement of the right piriform sinus by small volume mildly  nodular soft tissue and possibly trace fluid. Mild diffuse subcutaneous and deep fat stranding/edema throughout the neck bilaterally. Mild retropharyngeal edema without fluid collection. Upper chest: Partially visualized right hilar/central right upper lobe mass as demonstrated on the prior chest CT with increased right upper lobe collapse/ consolidation. Small right pleural effusion. SCC narrowing. Right upper lobe bronchus encasement. Review of the MIP images confirms the above findings CTA HEAD FINDINGS Anterior circulation: The internal carotid arteries are patent from skullbase to carotid termini. There is minimal carotid siphon atherosclerosis without significant stenosis. ACAs and MCAs are patent without evidence of proximal branch occlusion or significant stenosis. No aneurysm. Posterior circulation: The intracranial vertebral arteries are patent to the basilar. Patent PICA and SCA origins are identified bilaterally. The basilar artery is patent and mildly small in caliber diffusely on a congenital basis. There are patent posterior communicating arteries with hypoplastic P1 segments bilaterally. No significant proximal PCA stenosis. No aneurysm. Venous sinuses: Patent. Anatomic variants: Hypoplastic P1 segments. Delayed  phase: No abnormal enhancement. Review of the MIP images confirms the above findings IMPRESSION: 1. No large vessel occlusion or significant intracranial arterial stenosis. 2. Right greater than left cervical carotid artery atherosclerosis without significant common or internal carotid artery stenosis. Mild-to-moderate right ECA origin stenosis. 3. Aortic Atherosclerosis (ICD10-I70.0). Mild proximal brachiocephalic and left subclavian artery stenosis. 4. Partially visualized right hilar mass as seen on recent chest CT with progressive right upper lobe collapse/consolidation. SVC narrowing with multiple collateral vessels. 5. Mild diffuse edema in the neck which may be secondary to SVC obstruction. 6. Asymmetric, mildly nodular soft tissue at the right tongue base. Consider direct visualization. 7. No evidence of acute intracranial abnormality or intracranial metastases. Electronically Signed   By: Logan Bores M.D.   On: 03/15/2017 09:29   Ct Angio Neck W Or Wo Contrast  Result Date: 03/15/2017 CLINICAL DATA:  Visual loss and throat swelling over several weeks. EXAM: CT ANGIOGRAPHY HEAD AND NECK TECHNIQUE: Multidetector CT imaging of the head and neck was performed using the standard protocol during bolus administration of intravenous contrast. Multiplanar CT image reconstructions and MIPs were obtained to evaluate the vascular anatomy. Carotid stenosis measurements (when applicable) are obtained utilizing NASCET criteria, using the distal internal carotid diameter as the denominator. CONTRAST:  100 mL Isovue 370 COMPARISON:  Chest CT 02/23/2017. FINDINGS: CT HEAD FINDINGS Brain: There is no evidence of acute infarct, intracranial hemorrhage, mass, midline shift, or extra-axial fluid collection. The ventricles and sulci are within normal limits for age. Periventricular white matter hypodensities are nonspecific but compatible with minimal chronic small vessel ischemic disease. Vascular: Calcified  atherosclerosis at the skullbase. Skull: No fracture or focal osseous lesion. Sinuses: Minimal right ethmoid air cell mucosal thickening. Clear mastoid air cells. Orbits: Bilateral cataract extraction. Review of the MIP images confirms the above findings CTA NECK FINDINGS Aortic arch: Standard 3 vessel aortic arch with moderate calcified plaque. Likely mild stenosis of the proximal brachiocephalic and left subclavian arteries. Limited assessment of the right subclavian artery due to calcified plaque and the adjacent dense venous contrast with resulting streak artifact. Right carotid system: Moderate calcified plaque at the carotid bifurcation and in the proximal ICA without significant common or internal carotid artery stenosis. Mild-to-moderate ECA origin stenosis. Left carotid system: Mild plaque at the carotid bifurcation without stenosis. Vertebral arteries: Patent with the left being dominant. No evidence of significant stenosis. Skeleton: Congenital C2-3 fusion. C5-6 ACDF with solid fusion. C4-5 disc degeneration with bulging, annular calcification and uncovertebral spurring  resulting in suspected moderate spinal stenosis and left greater than right neural foraminal stenosis. Other neck: Asymmetric effacement of the right piriform sinus by small volume mildly nodular soft tissue and possibly trace fluid. Mild diffuse subcutaneous and deep fat stranding/edema throughout the neck bilaterally. Mild retropharyngeal edema without fluid collection. Upper chest: Partially visualized right hilar/central right upper lobe mass as demonstrated on the prior chest CT with increased right upper lobe collapse/ consolidation. Small right pleural effusion. SCC narrowing. Right upper lobe bronchus encasement. Review of the MIP images confirms the above findings CTA HEAD FINDINGS Anterior circulation: The internal carotid arteries are patent from skullbase to carotid termini. There is minimal carotid siphon atherosclerosis  without significant stenosis. ACAs and MCAs are patent without evidence of proximal branch occlusion or significant stenosis. No aneurysm. Posterior circulation: The intracranial vertebral arteries are patent to the basilar. Patent PICA and SCA origins are identified bilaterally. The basilar artery is patent and mildly small in caliber diffusely on a congenital basis. There are patent posterior communicating arteries with hypoplastic P1 segments bilaterally. No significant proximal PCA stenosis. No aneurysm. Venous sinuses: Patent. Anatomic variants: Hypoplastic P1 segments. Delayed phase: No abnormal enhancement. Review of the MIP images confirms the above findings IMPRESSION: 1. No large vessel occlusion or significant intracranial arterial stenosis. 2. Right greater than left cervical carotid artery atherosclerosis without significant common or internal carotid artery stenosis. Mild-to-moderate right ECA origin stenosis. 3. Aortic Atherosclerosis (ICD10-I70.0). Mild proximal brachiocephalic and left subclavian artery stenosis. 4. Partially visualized right hilar mass as seen on recent chest CT with progressive right upper lobe collapse/consolidation. SVC narrowing with multiple collateral vessels. 5. Mild diffuse edema in the neck which may be secondary to SVC obstruction. 6. Asymmetric, mildly nodular soft tissue at the right tongue base. Consider direct visualization. 7. No evidence of acute intracranial abnormality or intracranial metastases. Electronically Signed   By: Logan Bores M.D.   On: 03/15/2017 09:29   Nm Pet Image Initial (pi) Skull Base To Thigh  Result Date: 03/25/2017 CLINICAL DATA:  Initial treatment strategy for lung mass. EXAM: NUCLEAR MEDICINE PET SKULL BASE TO THIGH TECHNIQUE: 6.3 MCi F-18 FDG was injected intravenously. Full-ring PET imaging was performed from the skull base to thigh after the radiotracer. CT data was obtained and used for attenuation correction and anatomic  localization. FASTING BLOOD GLUCOSE:  Value: 180 mg/dl COMPARISON:  CT chest 02/23/2017 FINDINGS: NECK: No hypermetabolic lymph nodes in the neck. CHEST: Small right pleural effusion noted. The central right perihilar and right paratracheal lung mass exhibits intense FDG uptake with an SUV max equal to 6.28. There is occlusion of the right upper lobe bronchus with postobstructive consolidation and atelectasis which is now involving the entire right upper lobe. There is a cavitary lung nodule within the left upper lobe which measures 2.4 cm and has an SUV max equal to 9.73. No hypermetabolic sub- carinal, left-sided mediastinal or left hilar hypermetabolic adenopathy. ABDOMEN/PELVIS: No abnormal hypermetabolic activity within the liver, pancreas, adrenal glands, or spleen. Aortic atherosclerosis noted. No significant FDG uptake associated with the low-attenuation right adrenal nodule compatible with benign adenoma. No hypermetabolic lymph nodes in the abdomen or pelvis. SKELETON: No focal hypermetabolic activity to suggest skeletal metastasis. IMPRESSION: 1. Central obstructing right paratracheal and right hilar lung mass exhibits intense FDG uptake and there is associated complete atelectasis and consolidation of the right upper lobe. 2. Cavitary lung lesion within the left upper lobe is also hypermetabolic and may represent a focus of metastatic disease  or a second primary lung neoplasm. 3. No evidence for distant hypermetabolic metastasis 4. Aortic atherosclerosis. Electronically Signed   By: Kerby Moors M.D.   On: 03/25/2017 09:05   Ct Biopsy  Result Date: 03/27/2017 CLINICAL DATA:  Obstructing hypermetabolic right upper lobe mass. Bronchoscopic biopsies have been nondiagnostic. Percutaneous biopsy is requested. Additional cavitary lingular lesion. EXAM: CT GUIDED CORE BIOPSY OF RIGHT UPPER LOBE LUNG MASS ANESTHESIA/SEDATION: Intravenous Fentanyl and Versed were administered as conscious sedation during  continuous monitoring of the patient's level of consciousness and physiological / cardiorespiratory status by the radiology RN, with a total moderate sedation time of 11 minutes. PROCEDURE: The procedure risks, benefits, and alternatives were explained to the patient. Questions regarding the procedure were encouraged and answered. The patient understands and consents to the procedure. Select axial scans through the thorax were obtained. The dominant obstructing right lesion was localized with comparison to previous PET-CT using consistent anatomic landmarks. An appropriate skin entry site was determined and marked. The operative field was prepped with chlorhexidinein a sterile fashion, and a sterile drape was applied covering the operative field. A sterile gown and sterile gloves were used for the procedure. Local anesthesia was provided with 1% Lidocaine. Under CT fluoroscopic guidance, a 17 gauge trocar needle was advanced to the margin of the lesion. Once needle tip position was confirmed at the periphery of the lesion, multiple coaxial 18-gauge 3 cm core biopsy samples were obtained, submitted in formalin to surgical pathology. The guide needle was removed. Postprocedure scans show a small amount of gas in the pleural space anterior to the collapsed right upper lobe despite the fact that the needle did not traverse aerated lung, suggesting some gas was entrained through the guide needle during biopsy. This was stable on subsequent imaging over 13 minutes, although a developing right pleural effusion was identified. Patient remained asymptomatic and hemodynamically stable for additional 30 minutes of observation. The patient tolerated the procedure well. COMPLICATIONS: Small pneumothorax and right pleural effusion, asymptomatic. SIR level A: No therapy, no consequence. FINDINGS: The right upper lobe lesion was localized with comparison to recent PET-CT. Representative core biopsy samples were obtained. Small  asymptomatic right pneumothorax and pleural effusion noted post biopsy. IMPRESSION: 1. Technically successful CT-guided core biopsy, right upper lobe lung lesion. Electronically Signed   By: Lucrezia Europe M.D.   On: 03/27/2017 12:55   Dg Chest Port 1 View  Result Date: 03/18/2017 CLINICAL DATA:  Status post lymph node biopsy EXAM: PORTABLE CHEST 1 VIEW COMPARISON:  Chest radiograph February 22, 2017 and chest CT February 23, 2017 FINDINGS: No demonstrable pneumothorax. There is volume loss the right with airspace consolidation in the right upper lobe. The area of apparent mass in the right upper lobe is not well seen by radiography. Adenopathy is noted in the right hilar region. The left lung is clear except for mild left midlung and left base atelectatic change. Heart size is normal. There is aortic atherosclerosis. There is postoperative change in the lower cervical spine. IMPRESSION: No pneumothorax. Opacity with volume loss right upper lobe. Previously noted masslike area right upper lobe not well seen currently. There is adenopathy in the right hilar region. No edema or consolidation on the left. Areas of atelectasis noted in left mid lung and left base. There is aortic atherosclerosis. Aortic Atherosclerosis (ICD10-I70.0). Electronically Signed   By: Lowella Grip III M.D.   On: 03/18/2017 10:06       IMPRESSION/PLAN: 1. 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 Dr. Lisbeth Renshaw discusses the plans for further work up as her suspected diagnosis has not been confirmed with tissue. We have contacted the internal medicine service here at [facility] and given the concerns with her tachycardia and blood pressure, we have asked for her to be admitted for observation while we continue to work her up with CT guided biopsy. I've also spoken with Dr. Vernard Gambles who's reviewed her scan and will proceed with biopsy. Dr. Lisbeth Renshaw reviews the options of radiotherapy with the patient to palliate and gain local  control for her right lung tumor.  We discussed the risks, benefits, short, and long term effects of radiotherapy, and the patient is interested in proceeding. Dr. Lisbeth Renshaw discusses the delivery and logistics of radiotherapy and anticipates a course of 6 1/2 weeks.   The above documentation reflects my direct findings during this shared patient visit. Please see the separate note by Dr. Lisbeth Renshaw on this date for the remainder of the patient's plan of care.    Carola Rhine, PAC

## 2017-04-05 NOTE — Progress Notes (Signed)
[place] Psychosocial Distress Screening Clinical Social Work  Clinical Social Work was referred by distress screening protocol.  The patient scored a 5 on the Psychosocial Distress Thermometer which indicates moderate distress. Clinical Social Worker contacted patient to assess for distress and other psychosocial needs. [name] reported she has completed over a week of radiation and is feeling very overwhelmed.  She shared her and her spouse both are feeling anxiety caused by "so many unknowns" and medical questions regarding treatment.  CSW encouraged patient to write down all her medical questions and ask her oncologist at her next meeting (patient thinks she will see radiation oncologist today after treatment).  CSW affirmed and validated patients feelings of confusion/anxiety and discussed strategies to cope with her emotions.  CSW also discussed serving as her own advocate and how to best communicate her concerns with her medical team.  Patient plans to follow up with CSW as other questions arise or if she continues to experience emotional concerns.  CSW suggested patient schedule counseling session with CSW if her anxiety worsens.  Patient concerned about radiation benefits coverage with her insurance.  CSW provided contact information for radiation financial advocates.  ONCBCN DISTRESS SCREENING 03/26/2017  Screening Type Initial Screening  Distress experienced in past week (1-10) 5  Practical problem type Insurance  Emotional problem type Nervousness/Anxiety  Physical Problem type Swollen arms/legs  Physician notified of physical symptoms Yes  Referral to clinical social work Yes  Referral to financial advocate Yes    [name], MSW, LCSW, OSW-C Clinical Social Worker [facility] [phone]

## 2017-04-05 NOTE — Progress Notes (Signed)
Oncology Nurse Navigator Documentation  Oncology Nurse Navigator Flowsheets 04/05/2017  Navigator Location [facility]  Navigator Encounter Type Other/contacted Dr. Julien Nordmann on when to schedule [name].   Treatment Phase Pre-Tx/Tx Discussion  Barriers/Navigation Needs Coordination of Care  Interventions Coordination of Care  Coordination of Care Other  Acuity Level 1  Time Spent with Patient 15

## 2017-04-08 ENCOUNTER — Ambulatory Visit: Payer: Medicare Other

## 2017-04-08 ENCOUNTER — Ambulatory Visit
Admission: RE | Admit: 2017-04-08 | Discharge: 2017-04-08 | Disposition: A | Discharge status: Home / Self Care | Payer: Medicare Other | Source: Ambulatory Visit | Attending: Radiation Oncology | Admitting: Radiation Oncology

## 2017-04-08 ENCOUNTER — Encounter: Payer: Self-pay | Admitting: Pulmonary Disease

## 2017-04-08 ENCOUNTER — Encounter (HOSPITAL_COMMUNITY): Payer: Self-pay

## 2017-04-08 ENCOUNTER — Telehealth: Payer: Self-pay | Admitting: Oncology

## 2017-04-08 ENCOUNTER — Telehealth: Payer: Self-pay | Admitting: Radiation Oncology

## 2017-04-08 ENCOUNTER — Ambulatory Visit: Payer: Medicare Other | Admitting: Pulmonary Disease

## 2017-04-08 ENCOUNTER — Telehealth: Payer: Self-pay | Admitting: *Deleted

## 2017-04-08 VITALS — BP 120/74 | HR 102 | Ht 60.0 in | Wt 118.4 lb

## 2017-04-08 DIAGNOSIS — J449 Chronic obstructive pulmonary disease, unspecified: Secondary | ICD-10-CM | POA: Diagnosis not present

## 2017-04-08 DIAGNOSIS — C3491 Malignant neoplasm of unspecified part of right bronchus or lung: Secondary | ICD-10-CM

## 2017-04-08 DIAGNOSIS — B370 Candidal stomatitis: Secondary | ICD-10-CM

## 2017-04-08 DIAGNOSIS — J4489 Other specified chronic obstructive pulmonary disease: Secondary | ICD-10-CM

## 2017-04-08 DIAGNOSIS — R918 Other nonspecific abnormal finding of lung field: Secondary | ICD-10-CM

## 2017-04-08 DIAGNOSIS — C3411 Malignant neoplasm of upper lobe, right bronchus or lung: Secondary | ICD-10-CM | POA: Insufficient documentation

## 2017-04-08 MED ORDER — GLYCOPYRROLATE-FORMOTEROL 9-4.8 MCG/ACT IN AERO: 2.0000 | INHALATION_SPRAY | Freq: Two times a day (BID) | RESPIRATORY_TRACT | 0 refills | Status: AC

## 2017-04-08 MED ORDER — NYSTATIN 100000 UNIT/ML MT SUSP: 5.0000 mL | Freq: Three times a day (TID) | OROMUCOSAL | 1 refills | Status: AC

## 2017-04-08 MED ORDER — FLUTTER DEVI: 1.0000 | 0 refills | Status: AC | PRN

## 2017-04-08 NOTE — Progress Notes (Signed)
Patient seen in the office today and instructed on use of bevespi.  Patient expressed understanding and demonstrated technique.

## 2017-04-08 NOTE — Telephone Encounter (Signed)
Left message for patient regarding appts added per 11/12 sch msg

## 2017-04-08 NOTE — Patient Instructions (Addendum)
   Continue using your nebulizer treatments as needed for our cough or any wheezing/shortness of breath.  Use the Bevespi sample we are giving you today with your spacer. Do 2 puffs twice daily/every 12 hours.  Call my office for a prescription if this seems to help your coughing & breathing.  Use the Flutter Valve we are giving you today by blowing into it 3 times in a row twice daily. This will help to loosen your mucus. Use this after your Bevespi.  We will see you back in 3 months or sooner if needed.   TESTS ORDERED: 1. 6MWT with oxygen titration at next appointment 2. Full PFTs at follow-up appointment

## 2017-04-08 NOTE — Telephone Encounter (Addendum)
I spoke with the patient today to summarize her treatment plans and where we are currently with her process. We would consider SBRT to the LUL lesion following definitive radiation.

## 2017-04-08 NOTE — Progress Notes (Signed)
Subjective:    Patient ID: [name], female    DOB: 1946/02/07, 71 y.o.   MRN: [id number]  C.C.:  Follow-up for NSCLC, COPD/Asthma, Acute Hypoxic Respiratory Failure & Tobacco Use Disorder.  HPI NSCLC: Right upper lobe mass is likely primary with spread to adjacent lung. Diagnosed with CT-guided lung biopsy. Following with radiation and medical oncology. 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 She just completed her 7th treatment. She hasn't started chemotherapy yet. She had sample sent for [facility].   COPD/asthma overlap:  She is still using her Nebulizer at least once daily. She does feel it helps. She reports she is coughing more and producing a "yellow" phlegm. She reports only a "spot" of blood twice.   Acute hypoxic respiratory failure: Prescribed oxygen at 2 L/m with exertion. She reports she is using it nearly 24 hours now.   Tobacco use disorder: Quit smoking prior to hospitalization in September. She still isn't smoking.   Review of Systems She reports she feels more fatigue. She denies any chest pain or pressure. She has had some near syncope that is transient. She doesn't feel like she is "choking". She denies any fever, chills, or sweats. She denies any dysphagia or odynophagia.   Allergies  Allergen Reactions  . Penicillins Hives    Has patient had a PCN reaction causing immediate rash, facial/tongue/throat swelling, SOB or lightheadedness with hypotension: no Has patient had a PCN reaction causing severe rash involving mucus membranes or skin necrosis: no Has patient had a PCN reaction that required hospitalization: yes Has patient had a PCN reaction occurring within the last 10 years: no If all of the above answers are "NO", then may proceed with Cephalosporin use.   . Tiotropium Bromide Monohydrate Other (See Comments)    EYE PAIN KIDNEY FUNCTION SLOWED DOWN    Current Outpatient Medications on File Prior to Visit    Medication Sig Dispense Refill  . acetaminophen (TYLENOL) 500 MG tablet Take 500 mg by mouth every 8 (eight) hours as needed for mild pain or headache.    Marland Kitchen aspirin 81 MG tablet Take 81 mg by mouth daily.    . citalopram (CELEXA) 20 MG tablet Take 10 mg by mouth daily.     Marland Kitchen dexamethasone (DECADRON) 4 MG tablet Take 1 tablet (4 mg total) by mouth 2 (two) times daily with a meal. 30 tablet 0  . Guaifenesin (MUCINEX MAXIMUM STRENGTH) 1200 MG TB12 Take 1,200 mg by mouth 2 (two) times daily.    Marland Kitchen ipratropium-albuterol (DUONEB) 0.5-2.5 (3) MG/3ML SOLN Take 3 mLs by nebulization every 6 (six) hours as needed. (Patient taking differently: Take 3 mLs by nebulization every 6 (six) hours as needed (shortness of breath). ) 120 mL 3  . losartan (COZAAR) 50 MG tablet Take 50 mg by mouth daily.  0  . non-metallic deodorant (ALRA) MISC Apply 1 application topically.    . polyethylene glycol (MIRALAX / GLYCOLAX) packet Take 17 g by mouth daily. (Patient taking differently: Take 17 g by mouth daily as needed for mild constipation. ) 14 each 0  . simvastatin (ZOCOR) 40 MG tablet Take 40 mg by mouth every evening.    Marland Kitchen Spacer/Aero Chamber Mouthpiece MISC 1 Device by Does not apply route as directed. 1 each 0  . Wound Dressings (SONAFINE) Apply 1 application topically 2 (two) times daily. Apply after rad txs and at bedtimes daily,nothing 4 hours prior to rad tx  No current facility-administered medications on file prior to visit.     Past Medical History:  Diagnosis Date  . Arthritis   . Asthma   . COPD (chronic obstructive pulmonary disease) ([place])   . Depression   . Diabetes mellitus    diet controlled  . History of hiatal hernia   . Hyperlipemia   . Hypertension   . Pneumonia     Past Surgical History:  Procedure Laterality Date  . CARDIAC CATHETERIZATION  1999  . CARPAL TUNNEL RELEASE  2/13   right-GSC  . CATARACT EXTRACTION    . CERVICAL FUSION  2002  . COLONOSCOPY    . TONSILLECTOMY       Family History  Problem Relation Age of Onset  . Diabetes Mother   . Colon cancer Father   . Cerebral palsy Brother   . Breast cancer Paternal Aunt   . Breast cancer Cousin   . Prostate cancer Paternal Uncle   . Bone cancer Maternal Aunt     Social History   Socioeconomic History  . Marital status: Married    Spouse name: None  . Number of children: None  . Years of education: None  . Highest education level: None  Social Needs  . Financial resource strain: None  . Food insecurity - worry: None  . Food insecurity - inability: None  . Transportation needs - medical: None  . Transportation needs - non-medical: None  Occupational History  . None  Tobacco Use  . Smoking status: Former Smoker    Packs/day: 1.00    Years: 53.00    Pack years: 53.00    Start date: 12/22/1962    Last attempt to quit: 02/25/2017    Years since quitting: 0.1  . Smokeless tobacco: Never Used  . Tobacco comment: Peak rate 1.5ppd - quit at most 6 months  Substance and Sexual Activity  . Alcohol use: No  . Drug use: No  . Sexual activity: Not Currently  Other Topics Concern  . None  Social History Narrative   [facility] (02/23/17):   Originally from [place]. Previously has lived in [place] as well as [place]. Previously worked with a Engineer, structural and in Engineer, materials. Remote exposure to a parrot. Currently lives with her husband. Retired.      Objective:   Physical Exam BP 120/74 (BP Location: Left Arm, Cuff Size: Normal)   Pulse (!) 102   Ht 5' (1.524 m)   Wt 118 lb 6 oz (53.7 kg)   SpO2 96%   BMI 23.12 kg/m   General:  Awake. Accompanied by husband today. No distress. Integument:  No rash. Warm. Dry. Extremities:  No cyanosis or clubbing.  HEENT:  No scleral icterus. Moist mucous membranes. No oral ulcers. Thrush present on tone. Cardiovascular:  Regular rate. No edema. Regular rhythm.  Pulmonary:  Normal work of breathing. Intermittent coughing with deep  inspiration noted. Good aeration bilaterally. Abdomen: Soft. Normal bowel sounds. Nondistended.  Musculoskeletal:  Normal bulk and tone. No joint deformity or effusion appreciated. Neurological:  Cranial nerves 2-12 grossly in tact. No meningismus. Moving all 4 extremities equally.   6MWT 03/08/17:  Walked 3 laps / Baseline Sat 90% on Room Air / Nadir Sat 84% on Room Air   IMAGING PET CT 03/25/17 (previously reviewed by me): IMPRESSION: 1. Central obstructing right paratracheal and right hilar lung mass exhibits intense FDG uptake and there is associated complete atelectasis and consolidation of the right upper lobe. 2. Cavitary lung lesion  within the left upper lobe is also hypermetabolic and may represent a focus of metastatic disease or a second primary lung neoplasm. 3. No evidence for distant hypermetabolic metastasis 4. Aortic atherosclerosis.  CTA CHEST 02/23/17 (previously reviewed by me):  Previously reviewed by me. Small pericardial effusion. Mild apical predominant emphysematous changes. Approximately 2.6 cm right adrenal nodule appreciated. No obvious skeletal metastases. Right upper lobe central mass noted with evidence of what appears to be lymphangitic spread. Right suprahilar soft tissue density suspicious for mass versus pathologically enlarged lymph nodes causing either extrinsic compression of airway or endobronchial invasion. No obvious pulmonary arterial invasion. Unable to appreciate pathologic mediastinal or right hilar adenopathy with possible mass invasion. No pleural effusion or thickening appreciated. Linear opacity left lung base suggestive of atelectasis. Necrotic mass present within the lingula measuring up to 2.1 cm. Additional left upper lobe nodule measuring approximately 1 cm identified. Radiology noted mass effect on the IVC with possible invasion as well as a small pericardial effusion.  CARDIAC TTE (02/23/17):  LV normal in size with EF 65-70%. No regional wall  motion abnormalities & grade 1 diastolic dysfunction. LA & RA normal in size. RV normal in size and function. Pulmonary artery systolic pressure 32 mmHg. No aortic stenosis or regurgitation. No mitral stenosis or regurgitation. No significant pulmonic regurgitation with poorly visualized valve. No tricuspid regurgitation. No pericardial effusion.   PATHOLOGY Right Upper Lobe CT-guided Biopsy 03/27/17:  Poorly differentiated carcinoma EBUS FNA LEVEL 7 03/18/17:  Rare atypical cells Right Upper Lobe Lavage Cytology 02/25/17:  Atypical Cells Present Right Upper Lobe Endobronchial Brushing 02/25/17:  Negative for Malignancy  MICROBIOLOGY Blood Cultures x2 02/23/17:  Negative  Urine Culture 02/22/17:  Multiple species Urine Streptococcal Antigen 02/23/17:  Negative  Urine Legionella Antigen 02/23/17:  Negative  Quantitative BAL Culture RUL 02/25/17:  Normal respiratory flora     Assessment & Plan:  71 y.o. female with right-sided non-small cell lung cancer. Left-sided lesion suspicious for metastatic spread. Physical exam today does show oral thrush. Her mucus production and productive cough are likely due to decreased level of obstruction leading to her right upper lobe. I am prescribing the patient a flutter valve to assist with airway clearance. Additionally, I believe she would benefit from a long-acting bronchodilator regimen given her underlying COPD. As such, I'm going to try her on Bevespi. I instructed the patient to contact my office for prescription if this inhaler seems to be effective or she has any questions before her next appointment.  1. NSCLC: Continuing follow-up with radiation oncology and medical oncology. 2. COPD/Asthma: Patient given sample of Bevespi to try twice daily. She will continue using her nebulizer as needed. Given flutter valve for airway clearance today. 3. Oral thrush: Prescribing nystatin swish and swallow 3 times a day. 4. Acute hypoxic respiratory failure: Continuing  on oxygen as previously prescribed at 2 L/m with exertion. 5. Health maintenance: Status post influenza vaccine October 2018. Reports she previously had pneumonia vaccines with PCP.  6. Follow-up: Return to clinic in 3 months or sooner if needed.  Sonia Baller Ashok Cordia, M.D. [facility] Pager:  [phone] After 7pm or if no response, call [phone] 12:27 PM 04/08/17

## 2017-04-08 NOTE — Telephone Encounter (Signed)
Oncology Nurse Navigator Documentation  Oncology Nurse Navigator Flowsheets 04/08/2017  Navigator Location [facility]  Navigator Encounter Type Telephone/per Dr. Julien Nordmann, he would like patient to see him or Mikey Bussing NP this week. I notified scheduling to schedule with Erasmo Downer on 04/11/17.  I called Ms. Angert to update her and to answer any questions she may have.  I was unable to reach but left vm message with my name and phone number to call if needed.   Telephone Outgoing Call  Treatment Phase Treatment  Barriers/Navigation Needs Coordination of Care;Education  Education Other  Interventions Education;Coordination of Care  Coordination of Care Appts  Education Method Verbal  Acuity Level 2  Time Spent with Patient 30

## 2017-04-09 ENCOUNTER — Ambulatory Visit: Payer: Medicare Other

## 2017-04-09 ENCOUNTER — Ambulatory Visit
Admission: RE | Admit: 2017-04-09 | Discharge: 2017-04-09 | Disposition: A | Discharge status: Home / Self Care | Payer: Medicare Other | Source: Ambulatory Visit | Attending: Radiation Oncology | Admitting: Radiation Oncology

## 2017-04-10 ENCOUNTER — Ambulatory Visit: Payer: Medicare Other

## 2017-04-10 ENCOUNTER — Ambulatory Visit
Admission: RE | Admit: 2017-04-10 | Discharge: 2017-04-10 | Disposition: A | Discharge status: Home / Self Care | Payer: Medicare Other | Source: Ambulatory Visit | Attending: Radiation Oncology | Admitting: Radiation Oncology

## 2017-04-10 NOTE — Progress Notes (Signed)
  Radiation Oncology         [phone] ________________________________  Name: [name] MRN: [id number]  Date: 03/27/2017  DOB: [birth year]  SIMULATION AND TREATMENT PLANNING NOTE  DIAGNOSIS:     ICD-10-CM   1. Malignant neoplasm of bronchus of right upper lobe (HCC) C34.11      Site:  Right lung  NARRATIVE:  The patient was brought to the [facility].  Identity was confirmed.  All relevant records and images related to the planned course of therapy were reviewed.   Written consent to proceed with treatment was confirmed which was freely given after reviewing the details related to the planned course of therapy had been reviewed with the patient.  Then, the patient was set-up in a stable reproducible  supine position for radiation therapy.  CT images were obtained.  Surface markings were placed.    Medically necessary complex treatment device(s) for immobilization: Customized Vac-Lok bag.   The CT images were loaded into the planning software.  Then the target and avoidance structures were contoured.  Treatment planning then occurred.  The radiation prescription was entered and confirmed.  A total of 4 complex treatment devices were fabricated which relate to the designed radiation treatment fields. Each of these customized fields/ complex treatment devices will be used on a daily basis during the radiation course. I have requested : 3D Simulation  I have requested a DVH of the following structures: Target volume, lungs, spinal cord, esophagus.   The patient will undergo daily image guidance to ensure accurate localization of the target, and adequate minimize dose to the normal surrounding structures in close proximity to the target.  PLAN:  The patient will initially receive 60 Gy in 30 fractions.  The patient it is anticipated will then receive a 6 Gy boost to yield a final dose of 66 Gy.  Special treatment procedure The patient will also receive concurrent  chemotherapy during the treatment. The patient may therefore experience increased toxicity or side effects and the patient will be monitored for such problems. This may require extra lab work as necessary. This therefore constitutes a special treatment procedure.   ________________________________   Jodelle Gross, MD, PhD

## 2017-04-11 ENCOUNTER — Telehealth: Payer: Self-pay | Admitting: *Deleted

## 2017-04-11 ENCOUNTER — Encounter (HOSPITAL_COMMUNITY): Payer: Self-pay

## 2017-04-11 ENCOUNTER — Ambulatory Visit: Payer: Medicare Other | Admitting: Oncology

## 2017-04-11 ENCOUNTER — Other Ambulatory Visit (HOSPITAL_BASED_OUTPATIENT_CLINIC_OR_DEPARTMENT_OTHER): Payer: Medicare Other

## 2017-04-11 ENCOUNTER — Inpatient Hospital Stay (HOSPITAL_COMMUNITY)
Admission: EM | Admit: 2017-04-11 | Discharge: 2017-04-14 | DRG: 308 | Disposition: A | Discharge status: Home Health | Payer: Medicare Other | Attending: Family Medicine | Admitting: Family Medicine

## 2017-04-11 ENCOUNTER — Other Ambulatory Visit: Payer: Self-pay

## 2017-04-11 ENCOUNTER — Ambulatory Visit: Payer: Medicare Other

## 2017-04-11 ENCOUNTER — Encounter: Payer: Self-pay | Admitting: Oncology

## 2017-04-11 ENCOUNTER — Emergency Department (HOSPITAL_COMMUNITY): Payer: Medicare Other

## 2017-04-11 ENCOUNTER — Ambulatory Visit (HOSPITAL_BASED_OUTPATIENT_CLINIC_OR_DEPARTMENT_OTHER): Payer: Medicare Other | Admitting: Oncology

## 2017-04-11 ENCOUNTER — Ambulatory Visit: Payer: Medicare Other | Admitting: Pulmonary Disease

## 2017-04-11 VITALS — BP 115/58 | HR 111 | Temp 97.6°F | Resp 18 | Ht 60.0 in | Wt 114.7 lb

## 2017-04-11 DIAGNOSIS — E875 Hyperkalemia: Secondary | ICD-10-CM | POA: Diagnosis present

## 2017-04-11 DIAGNOSIS — F329 Major depressive disorder, single episode, unspecified: Secondary | ICD-10-CM | POA: Diagnosis present

## 2017-04-11 DIAGNOSIS — T380X5A Adverse effect of glucocorticoids and synthetic analogues, initial encounter: Secondary | ICD-10-CM | POA: Diagnosis present

## 2017-04-11 DIAGNOSIS — C3411 Malignant neoplasm of upper lobe, right bronchus or lung: Secondary | ICD-10-CM | POA: Diagnosis present

## 2017-04-11 DIAGNOSIS — Z808 Family history of malignant neoplasm of other organs or systems: Secondary | ICD-10-CM

## 2017-04-11 DIAGNOSIS — Z803 Family history of malignant neoplasm of breast: Secondary | ICD-10-CM

## 2017-04-11 DIAGNOSIS — I871 Compression of vein: Secondary | ICD-10-CM | POA: Diagnosis not present

## 2017-04-11 DIAGNOSIS — Z833 Family history of diabetes mellitus: Secondary | ICD-10-CM | POA: Diagnosis not present

## 2017-04-11 DIAGNOSIS — M7989 Other specified soft tissue disorders: Secondary | ICD-10-CM | POA: Diagnosis not present

## 2017-04-11 DIAGNOSIS — Z981 Arthrodesis status: Secondary | ICD-10-CM | POA: Diagnosis not present

## 2017-04-11 DIAGNOSIS — Z888 Allergy status to other drugs, medicaments and biological substances status: Secondary | ICD-10-CM | POA: Diagnosis not present

## 2017-04-11 DIAGNOSIS — E1110 Type 2 diabetes mellitus with ketoacidosis without coma: Secondary | ICD-10-CM | POA: Diagnosis present

## 2017-04-11 DIAGNOSIS — E162 Hypoglycemia, unspecified: Secondary | ICD-10-CM | POA: Diagnosis not present

## 2017-04-11 DIAGNOSIS — B370 Candidal stomatitis: Secondary | ICD-10-CM | POA: Diagnosis present

## 2017-04-11 DIAGNOSIS — E1165 Type 2 diabetes mellitus with hyperglycemia: Secondary | ICD-10-CM

## 2017-04-11 DIAGNOSIS — R918 Other nonspecific abnormal finding of lung field: Secondary | ICD-10-CM | POA: Diagnosis not present

## 2017-04-11 DIAGNOSIS — I951 Orthostatic hypotension: Secondary | ICD-10-CM | POA: Diagnosis present

## 2017-04-11 DIAGNOSIS — R42 Dizziness and giddiness: Secondary | ICD-10-CM

## 2017-04-11 DIAGNOSIS — Z800 Family history of malignant neoplasm of digestive organs: Secondary | ICD-10-CM

## 2017-04-11 DIAGNOSIS — J449 Chronic obstructive pulmonary disease, unspecified: Secondary | ICD-10-CM | POA: Diagnosis present

## 2017-04-11 DIAGNOSIS — R5383 Other fatigue: Secondary | ICD-10-CM

## 2017-04-11 DIAGNOSIS — E860 Dehydration: Secondary | ICD-10-CM | POA: Diagnosis present

## 2017-04-11 DIAGNOSIS — R739 Hyperglycemia, unspecified: Secondary | ICD-10-CM | POA: Diagnosis present

## 2017-04-11 DIAGNOSIS — E785 Hyperlipidemia, unspecified: Secondary | ICD-10-CM | POA: Diagnosis present

## 2017-04-11 DIAGNOSIS — I4891 Unspecified atrial fibrillation: Secondary | ICD-10-CM | POA: Diagnosis present

## 2017-04-11 DIAGNOSIS — I10 Essential (primary) hypertension: Secondary | ICD-10-CM | POA: Diagnosis present

## 2017-04-11 DIAGNOSIS — Z87891 Personal history of nicotine dependence: Secondary | ICD-10-CM

## 2017-04-11 DIAGNOSIS — I480 Paroxysmal atrial fibrillation: Principal | ICD-10-CM | POA: Diagnosis present

## 2017-04-11 DIAGNOSIS — Z880 Allergy status to penicillin: Secondary | ICD-10-CM

## 2017-04-11 DIAGNOSIS — Z7982 Long term (current) use of aspirin: Secondary | ICD-10-CM

## 2017-04-11 DIAGNOSIS — N179 Acute kidney failure, unspecified: Secondary | ICD-10-CM

## 2017-04-11 LAB — CBC WITH DIFFERENTIAL/PLATELET
BASO%: 0.1 % (ref 0.0–2.0)
Basophils Absolute: 0 10*3/uL (ref 0.0–0.1)
EOS%: 0 % (ref 0.0–7.0)
Eosinophils Absolute: 0 10*3/uL (ref 0.0–0.5)
HEMATOCRIT: 42.6 % (ref 34.8–46.6)
HEMOGLOBIN: 13.6 g/dL (ref 11.6–15.9)
LYMPH#: 1.3 10*3/uL (ref 0.9–3.3)
LYMPH%: 6.8 % — ABNORMAL LOW (ref 14.0–49.7)
MCH: 31.6 pg (ref 25.1–34.0)
MCHC: 31.9 g/dL (ref 31.5–36.0)
MCV: 99.1 fL (ref 79.5–101.0)
MONO#: 0.3 10*3/uL (ref 0.1–0.9)
MONO%: 1.8 % (ref 0.0–14.0)
NEUT%: 91.3 % — ABNORMAL HIGH (ref 38.4–76.8)
NEUTROS ABS: 17 10*3/uL — AB (ref 1.5–6.5)
Platelets: 213 10*3/uL (ref 145–400)
RBC: 4.3 10*6/uL (ref 3.70–5.45)
RDW: 13.8 % (ref 11.2–14.5)
WBC: 18.6 10*3/uL — ABNORMAL HIGH (ref 3.9–10.3)

## 2017-04-11 LAB — COMPREHENSIVE METABOLIC PANEL
ALBUMIN: 3.2 g/dL — AB (ref 3.5–5.0)
ALK PHOS: 65 U/L (ref 40–150)
ALT: 26 U/L (ref 0–55)
AST: 8 U/L (ref 5–34)
Anion Gap: 11 mEq/L (ref 3–11)
BUN: 44.9 mg/dL — AB (ref 7.0–26.0)
CALCIUM: 8.7 mg/dL (ref 8.4–10.4)
CHLORIDE: 92 meq/L — AB (ref 98–109)
CO2: 24 mEq/L (ref 22–29)
CREATININE: 1.2 mg/dL — AB (ref 0.6–1.1)
EGFR: 46 mL/min/{1.73_m2} — ABNORMAL LOW (ref >60)
Glucose: 678 mg/dl (ref 70–140)
Potassium: 5.7 mEq/L — ABNORMAL HIGH (ref 3.5–5.1)
Sodium: 127 mEq/L — ABNORMAL LOW (ref 136–145)
TOTAL PROTEIN: 5.6 g/dL — AB (ref 6.4–8.3)
Total Bilirubin: 0.84 mg/dL (ref 0.20–1.20)

## 2017-04-11 LAB — URINALYSIS, ROUTINE W REFLEX MICROSCOPIC
BILIRUBIN URINE: NEGATIVE
Bacteria, UA: NONE SEEN
Hgb urine dipstick: NEGATIVE
KETONES UR: 20 mg/dL — AB
LEUKOCYTES UA: NEGATIVE
NITRITE: NEGATIVE
PH: 5 (ref 5.0–8.0)
Protein, ur: NEGATIVE mg/dL
Specific Gravity, Urine: 1.031 — ABNORMAL HIGH (ref 1.005–1.030)
Squamous Epithelial / LPF: NONE SEEN

## 2017-04-11 LAB — CBG MONITORING, ED
GLUCOSE-CAPILLARY: 271 mg/dL — AB (ref 65–99)
Glucose-Capillary: 541 mg/dL (ref 65–99)
Glucose-Capillary: 419 mg/dL — ABNORMAL HIGH (ref 65–99)

## 2017-04-11 LAB — HEMOGLOBIN A1C
Hgb A1c MFr Bld: 10.6 % — ABNORMAL HIGH (ref 4.8–5.6)
MEAN PLASMA GLUCOSE: 257.52 mg/dL

## 2017-04-11 LAB — BASIC METABOLIC PANEL
ANION GAP: 10 (ref 5–15)
BUN: 37 mg/dL — ABNORMAL HIGH (ref 6–20)
CALCIUM: 8.2 mg/dL — AB (ref 8.9–10.3)
CO2: 23 mmol/L (ref 22–32)
Chloride: 99 mmol/L — ABNORMAL LOW (ref 101–111)
Creatinine, Ser: 0.62 mg/dL (ref 0.44–1.00)
GFR calc Af Amer: >60 mL/min (ref >60)
GFR calc non Af Amer: >60 mL/min (ref >60)
Glucose, Bld: 384 mg/dL — ABNORMAL HIGH (ref 65–99)
POTASSIUM: 4 mmol/L (ref 3.5–5.1)
SODIUM: 132 mmol/L — AB (ref 135–145)

## 2017-04-11 LAB — BLOOD GAS, VENOUS
Acid-base deficit: 1.5 mmol/L (ref 0.0–2.0)
Bicarbonate: 25 mmol/L (ref 20.0–28.0)
O2 Saturation: 51.4 %
PH VEN: 7.311 (ref 7.250–7.430)
Patient temperature: 98.6
pCO2, Ven: 51 mmHg (ref 44.0–60.0)
pO2, Ven: 34.6 mmHg (ref 32.0–45.0)

## 2017-04-11 LAB — GLUCOSE, CAPILLARY
Glucose-Capillary: 321 mg/dL — ABNORMAL HIGH (ref 65–99)
Glucose-Capillary: 399 mg/dL — ABNORMAL HIGH (ref 65–99)

## 2017-04-11 LAB — TROPONIN I: TROPONIN I: 0.06 ng/mL — AB (ref <0.03)

## 2017-04-11 LAB — MRSA PCR SCREENING: MRSA BY PCR: NEGATIVE

## 2017-04-11 MED ORDER — DILTIAZEM HCL-DEXTROSE 100-5 MG/100ML-% IV SOLN (PREMIX)
5.0000 mg/h | INTRAVENOUS | Status: DC
  Administered 2017-04-11: 5 mg/h via INTRAVENOUS
  Administered 2017-04-11: 10 mg/h via INTRAVENOUS
  Filled 2017-04-11 (×2): qty 100

## 2017-04-11 MED ORDER — IPRATROPIUM-ALBUTEROL 0.5-2.5 (3) MG/3ML IN SOLN
3.0000 mL | Freq: Four times a day (QID) | RESPIRATORY_TRACT | Status: DC | PRN
  Administered 2017-04-12: 3 mL via RESPIRATORY_TRACT
  Filled 2017-04-11: qty 3

## 2017-04-11 MED ORDER — SODIUM CHLORIDE 0.9 % IV BOLUS (SEPSIS)
1000.0000 mL | Freq: Once | INTRAVENOUS | Status: AC
  Administered 2017-04-11: 1000 mL via INTRAVENOUS

## 2017-04-11 MED ORDER — INSULIN GLARGINE 100 UNIT/ML ~~LOC~~ SOLN
10.0000 [IU] | Freq: Every day | SUBCUTANEOUS | Status: DC
  Administered 2017-04-11 – 2017-04-14 (×4): 10 [IU] via SUBCUTANEOUS
  Filled 2017-04-11 (×5): qty 0.1

## 2017-04-11 MED ORDER — SIMVASTATIN 40 MG PO TABS
40.0000 mg | ORAL_TABLET | Freq: Every evening | ORAL | Status: DC
  Administered 2017-04-11: 40 mg via ORAL
  Filled 2017-04-11: qty 1

## 2017-04-11 MED ORDER — SODIUM CHLORIDE 0.9 % IV SOLN
INTRAVENOUS | Status: DC
  Administered 2017-04-11: 3.6 [IU]/h via INTRAVENOUS
  Filled 2017-04-11: qty 1

## 2017-04-11 MED ORDER — ONDANSETRON HCL 4 MG PO TABS: 4.0000 mg | ORAL_TABLET | Freq: Four times a day (QID) | ORAL | Status: DC | PRN

## 2017-04-11 MED ORDER — DILTIAZEM LOAD VIA INFUSION
10.0000 mg | Freq: Once | INTRAVENOUS | Status: AC
  Administered 2017-04-11: 5 mg via INTRAVENOUS
  Filled 2017-04-11: qty 10

## 2017-04-11 MED ORDER — INSULIN ASPART 100 UNIT/ML ~~LOC~~ SOLN: 0.0000 [IU] | Freq: Three times a day (TID) | SUBCUTANEOUS | Status: DC

## 2017-04-11 MED ORDER — ACETAMINOPHEN 325 MG PO TABS
650.0000 mg | ORAL_TABLET | Freq: Four times a day (QID) | ORAL | Status: DC | PRN
Start: 2017-04-11 — End: 2017-04-14
  Administered 2017-04-14: 650 mg via ORAL
  Filled 2017-04-11: qty 2

## 2017-04-11 MED ORDER — CITALOPRAM HYDROBROMIDE 20 MG PO TABS
20.0000 mg | ORAL_TABLET | Freq: Every day | ORAL | Status: DC
  Administered 2017-04-12 – 2017-04-14 (×3): 20 mg via ORAL
  Filled 2017-04-11 (×3): qty 1

## 2017-04-11 MED ORDER — ACETAMINOPHEN 650 MG RE SUPP: 650.0000 mg | Freq: Four times a day (QID) | RECTAL | Status: DC | PRN

## 2017-04-11 MED ORDER — SODIUM CHLORIDE 0.9 % IV BOLUS (SEPSIS)
250.0000 mL | Freq: Once | INTRAVENOUS | Status: AC
  Administered 2017-04-11: 250 mL via INTRAVENOUS

## 2017-04-11 MED ORDER — HEPARIN SODIUM (PORCINE) 5000 UNIT/ML IJ SOLN
5000.0000 [IU] | Freq: Three times a day (TID) | INTRAMUSCULAR | Status: DC
  Administered 2017-04-11 – 2017-04-13 (×6): 5000 [IU] via SUBCUTANEOUS
  Filled 2017-04-11 (×6): qty 1

## 2017-04-11 MED ORDER — SODIUM CHLORIDE 0.9 % IV SOLN: INTRAVENOUS | Status: DC

## 2017-04-11 MED ORDER — ONDANSETRON HCL 4 MG/2ML IJ SOLN: 4.0000 mg | Freq: Four times a day (QID) | INTRAMUSCULAR | Status: DC | PRN

## 2017-04-11 MED ORDER — INSULIN ASPART 100 UNIT/ML ~~LOC~~ SOLN
0.0000 [IU] | Freq: Three times a day (TID) | SUBCUTANEOUS | Status: DC
Start: 2017-04-11 — End: 2017-04-14
  Administered 2017-04-11 – 2017-04-12 (×2): 11 [IU] via SUBCUTANEOUS
  Administered 2017-04-12: 2 [IU] via SUBCUTANEOUS
  Administered 2017-04-12: 8 [IU] via SUBCUTANEOUS
  Administered 2017-04-13: 2 [IU] via SUBCUTANEOUS
  Administered 2017-04-13 (×2): 8 [IU] via SUBCUTANEOUS
  Administered 2017-04-14: 3 [IU] via SUBCUTANEOUS

## 2017-04-11 MED ORDER — INSULIN ASPART 100 UNIT/ML ~~LOC~~ SOLN
0.0000 [IU] | Freq: Every day | SUBCUTANEOUS | Status: DC
  Administered 2017-04-11: 5 [IU] via SUBCUTANEOUS

## 2017-04-11 MED ORDER — SODIUM CHLORIDE 0.9 % IV SOLN
INTRAVENOUS | Status: DC
  Administered 2017-04-11: 19:00:00 via INTRAVENOUS

## 2017-04-11 MED ORDER — ASPIRIN EC 81 MG PO TBEC: 81.0000 mg | DELAYED_RELEASE_TABLET | Freq: Every day | ORAL | Status: DC

## 2017-04-11 MED ORDER — ORAL CARE MOUTH RINSE
15.0000 mL | Freq: Two times a day (BID) | OROMUCOSAL | Status: DC
  Administered 2017-04-12 – 2017-04-14 (×5): 15 mL via OROMUCOSAL

## 2017-04-11 MED ORDER — ASPIRIN EC 81 MG PO TBEC
81.0000 mg | DELAYED_RELEASE_TABLET | Freq: Every day | ORAL | Status: DC
  Administered 2017-04-12 – 2017-04-13 (×2): 81 mg via ORAL
  Filled 2017-04-11 (×2): qty 1

## 2017-04-11 NOTE — ED Provider Notes (Addendum)
[facility] DEPT Provider Note   CSN: [id number] Arrival date & time: 04/11/17  1055     History   Chief Complaint Chief Complaint  Patient presents with  . Abnormal Labs    HPI KRISNA OMAR is a 71 y.o. female.  HPI Patient presents to the emergency room for evaluation of hyperglycemia.  Patient has a history of diet-controlled diabetes.  Patient states several months ago her doctor noticed that her blood sugar was slightly elevated.  Patient states her hemoglobin A1c however was still good so the plan was to have her follow-up in September to recheck her blood sugar after dietary modification.  Patient states in the interim she ended up getting diagnosed with lung cancer.  She has been getting treatments for that.  She had not actually followed up with her primary care doctor regarding the blood sugar.  She was put on steroids for treatment of her lung cancer.  She is noticed over the last several days that she has had increased thirst, polydipsia and polyuria.  She was at the cancer center this morning and she had laboratory tests.  Patient's blood sugar was elevated at 678.  She was sent from the cancer center to the emergency room for evaluation Past Medical History:  Diagnosis Date  . Arthritis   . Asthma   . COPD (chronic obstructive pulmonary disease) ([place])   . Depression   . Diabetes mellitus    diet controlled  . History of hiatal hernia   . Hyperlipemia   . Hypertension   . Pneumonia     Patient Active Problem List   Diagnosis Date Noted  . Malignant neoplasm of bronchus of right upper lobe ([place]) 04/08/2017  . Oral thrush 04/08/2017  . 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 Throat swelling 03/13/2017  . Blurry vision, left eye 03/13/2017  . 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 Acute respiratory failure with hypoxia ([place]) 03/08/2017  . Mass of right lung 03/07/2017  . Postobstructive pneumonia  02/23/2017  . Tobacco use disorder 02/23/2017  . COPD (chronic obstructive pulmonary disease) ([place]) 02/23/2017  . Hyperlipemia 02/23/2017  . Hypertension 02/23/2017  . Depression 02/23/2017  . Type 2 diabetes mellitus ([place]) 02/23/2017  . Lung mass 02/23/2017    Past Surgical History:  Procedure Laterality Date  . CARDIAC CATHETERIZATION  1999  . CARPAL TUNNEL RELEASE  2/13   right-[facility]  . CARPAL TUNNEL RELEASE  08/15/2011   Procedure: CARPAL TUNNEL RELEASE;  Surgeon: Linna Hoff, MD;  Location: [facility];  Service: Orthopedics;  Laterality: Left;  . CATARACT EXTRACTION    . CERVICAL FUSION  2002  . COLONOSCOPY    . ENDOBRONCHIAL ULTRASOUND Bilateral 03/18/2017   Procedure: ENDOBRONCHIAL ULTRASOUND;  Surgeon: Javier Glazier, MD;  Location: [facility];  Service: Cardiopulmonary;  Laterality: Bilateral;  . TONSILLECTOMY    . VIDEO BRONCHOSCOPY Bilateral 02/25/2017   Procedure: VIDEO BRONCHOSCOPY WITHOUT FLUORO;  Surgeon: Javier Glazier, MD;  Location: [facility];  Service: Cardiopulmonary;  Laterality: Bilateral;    OB History    No data available       Home Medications    Prior to Admission medications   Medication Sig Start Date End Date Taking? Authorizing Provider  aspirin 81 MG tablet Take 81 mg by mouth daily.   Yes [provider]  citalopram (CELEXA) 20 MG tablet Take 20 mg daily by mouth.    Yes [provider]  dexamethasone (DECADRON) 4 MG tablet Take 1 tablet (4 mg total) by mouth 2 (two) times daily with a meal. 03/27/17  Yes Patrecia Pour, MD  Glycopyrrolate-Formoterol (BEVESPI AEROSPHERE) 9-4.8 MCG/ACT AERO Inhale 2 puffs 2 (two) times daily into the lungs. 04/08/17  Yes Nestor, Sonia Baller, MD  Guaifenesin [facility] MAXIMUM STRENGTH) 1200 MG TB12 Take 1,200 mg by mouth 2 (two) times daily.   Yes [provider]  ipratropium-albuterol (DUONEB) 0.5-2.5 (3) MG/3ML SOLN Take 3 mLs by nebulization every 6 (six)  hours as needed. Patient taking differently: Take 3 mLs by nebulization every 6 (six) hours as needed (shortness of breath).  03/08/17  Yes Javier Glazier, MD  losartan (COZAAR) 50 MG tablet Take 50 mg by mouth daily. 12/26/16  Yes [provider]  non-metallic deodorant Jethro Poling) MISC Apply 1 application topically. 04/03/17  Yes Hayden Pedro, PA-C  nystatin (MYCOSTATIN) 100000 UNIT/ML suspension Take 5 mLs (500,000 Units total) 3 (three) times daily by mouth. Make sure to swish and swallow. 04/08/17  Yes Javier Glazier, MD  polyethylene glycol [facility] / GLYCOLAX) packet Take 17 g by mouth daily. Patient taking differently: Take 17 g by mouth daily as needed for mild constipation.  02/27/17  Yes Lavina Hamman, MD  Respiratory Therapy Supplies (FLUTTER) DEVI 1 Device as needed by Does not apply route. 04/08/17  Yes Javier Glazier, MD  simvastatin (ZOCOR) 40 MG tablet Take 40 mg by mouth every evening.   Yes [provider]  Spacer/Aero Chamber Mouthpiece MISC 1 Device by Does not apply route as directed. 03/08/17  Yes Javier Glazier, MD  Wound Dressings (SONAFINE) Apply 1 application topically 2 (two) times daily. Apply after rad txs and at bedtimes daily,nothing 4 hours prior to rad tx 03/29/17  Yes Hayden Pedro, PA-C  acetaminophen (TYLENOL) 500 MG tablet Take 500 mg by mouth every 8 (eight) hours as needed for mild pain or headache.    [provider]    Family History Family History  Problem Relation Age of Onset  . Diabetes Mother   . Colon cancer Father   . Cerebral palsy Brother   . Breast cancer Paternal Aunt   . Breast cancer Cousin   . Prostate cancer Paternal Uncle   . Bone cancer Maternal Aunt     Social History Social History   Tobacco Use  . Smoking status: Former Smoker    Packs/day: 1.00    Years: 53.00    Pack years: 53.00    Start date: 12/22/1962    Last attempt to quit: 02/25/2017    Years since quitting:  0.1  . Smokeless tobacco: Never Used  . Tobacco comment: Peak rate 1.5ppd - quit at most 6 months  Substance Use Topics  . Alcohol use: No  . Drug use: No     Allergies   Penicillins and Tiotropium bromide monohydrate   Review of Systems Review of Systems   Physical Exam Updated Vital Signs BP 99/65 (BP Location: Right Arm)   Pulse 99   Temp 97.7 F (36.5 C) (Oral)   Resp 16   SpO2 91%   Physical Exam  Constitutional: No distress.  HENT:  Head: Normocephalic and atraumatic.  Right Ear: External ear normal.  Left Ear: External ear normal.  Mucous membranes dry  Eyes: Conjunctivae are normal. Right eye exhibits no discharge. Left eye exhibits no discharge. No scleral icterus.  Neck: Neck supple. No tracheal deviation present.  Cardiovascular: Normal rate, regular rhythm and intact distal pulses.  Pulmonary/Chest: Effort normal and breath sounds normal. No stridor. No respiratory distress. She has no wheezes. She has no rales.  Abdominal: Soft. Bowel sounds are normal. She exhibits no distension. There is no tenderness. There is no rebound and no guarding.  Musculoskeletal: She exhibits no edema or tenderness.  Neurological: She is alert. She has normal strength. No cranial nerve deficit (no facial droop, extraocular movements intact, no slurred speech) or sensory deficit. She exhibits normal muscle tone. She displays no seizure activity. Coordination normal.  Skin: Skin is warm and dry. No rash noted. She is not diaphoretic.  Psychiatric: She has a normal mood and affect.  Nursing note and vitals reviewed.    ED Treatments / Results  Labs (all labs ordered are listed, but only abnormal results are displayed) Labs Reviewed  URINALYSIS, ROUTINE W REFLEX MICROSCOPIC - Abnormal; Notable for the following components:      Result Value   Specific Gravity, Urine 1.031 (*)    Glucose, UA >=500 (*)    Ketones, ur 20 (*)    All other components within normal limits  CBG  MONITORING, ED - Abnormal; Notable for the following components:   Glucose-Capillary 541 (*)    All other components within normal limits  CBG MONITORING, ED - Abnormal; Notable for the following components:   Glucose-Capillary 419 (*)    All other components within normal limits  BLOOD GAS, VENOUS  Labs reviewed from the cancer center drawn this morning.  Patient's white blood cell count is 18.6.  Hemoglobin is 13.6.  Platelet count is 213.  She had a comprehensive metabolic panel that showed her sodium was 127, potassium was elevated at 5.7.  Chloride was decreased at 92 and glucose was increased at 678.  Her BUN was elevated at 44.9 increased from 15 just 2 weeks ago.  Creatinine was also elevated at 1.2 increased from 0.56, 2 weeks ago  EKG  EKG Interpretation  Date/Time:  Thursday April 11 2017 13:01:31 EST Ventricular Rate:  98 PR Interval:    QRS Duration: 115 QT Interval:  371 QTC Calculation: 474 R Axis:   29 Text Interpretation:  Sinus rhythm Biatrial enlargement Nonspecific intraventricular conduction delay , new since last tracing Anterior infarct, old Minimal ST elevation, inferior leads , similar to prior ECG Confirmed by Dorie Rank [phone]) on 04/11/2017 1:17:46 PM       Radiology Dg Chest 2 View  Result Date: 04/11/2017 CLINICAL DATA:  Hyperglycemia.  History of lung cancer. EXAM: CHEST  2 VIEW COMPARISON:  Chest CT 03/25/2017 FINDINGS: Chronic right upper lobe collapse. Known chronic nodular opacity in the left mid lung. No evidence of pneumonia or edema. No effusion or pneumothorax. Normal heart size. IMPRESSION: Known right upper lobe collapse and cavitary left lung nodule. No acute superimposed finding. Electronically Signed   By: Monte Fantasia M.D.   On: 04/11/2017 11:46    Procedures .Critical Care Performed by: Dorie Rank, MD Authorized by: Dorie Rank, MD   Critical care provider statement:    Critical care time (minutes):  30   Critical care was time  spent personally by me on the following activities:  Discussions with consultants, evaluation of patient's response to treatment, examination of patient, ordering and performing treatments and interventions, ordering and review of laboratory studies, ordering and review of radiographic studies, pulse oximetry, re-evaluation of patient's condition, obtaining history from patient or surrogate and review of old charts   (  including critical care time)  Medications Ordered in ED Medications  sodium chloride 0.9 % bolus 1,000 mL (1,000 mLs Intravenous New Bag/Given 04/11/17 1227)    And  sodium chloride 0.9 % bolus 1,000 mL (not administered)    And  0.9 %  sodium chloride infusion (not administered)  insulin regular (NOVOLIN R,HUMULIN R) 100 Units in sodium chloride 0.9 % 100 mL (1 Units/mL) infusion (3.6 Units/hr Intravenous New Bag/Given 04/11/17 1329)     Initial Impression / Assessment and Plan / ED Course  I have reviewed the triage vital signs and the nursing notes.  Pertinent labs & imaging results that were available during my care of the patient were reviewed by me and considered in my medical decision making (see chart for details).  Clinical Course as of Apr 12 1335  Thu Apr 11, 2017  1334 While in the ED, pt fell attempting to go to the bathroom.  Denies any injuries.  No injuries noted on my exam  [JK]    Clinical Course User Index [JK] Dorie Rank, MD    Patient presented to the emergency room from the cancer center for hyperglycemia.  Patient has a history of diabetes.  She was recently started on steroids and has had a notable increase in her blood sugar since then.  Patient's laboratory tests are notable for acute kidney injury dehydration.  Patient preferred to go home however she had a near syncopal episode here in the emergency room.  With her laboratory abnormalities and her weakness and her immunocompromised state I think it would be best for her to be admitted for IV  fluid and hydration.  Final Clinical Impressions(s) / ED Diagnoses   Final diagnoses:  Hyperglycemia  Dehydration  Acute kidney injury [facility])     Dorie Rank, MD 04/11/17 1338   2:25 PM I was notified that the pt's heart rate is elevated to 150s.  Pt denies any complaints right now.  Hr is in the 150s.  Appears irregular.  EKG shows a fib.  Will start cardizem.  Dr [place] Pink is at the bedside.  Will monitor post cardizem.  This patients CHA2DS2-VASc Score and unadjusted Ischemic Stroke Rate (% per year) is equal to 4.8 % stroke rate/year from a score of 4       Dorie Rank, MD 04/11/17 1429

## 2017-04-11 NOTE — ED Notes (Addendum)
Admitting MD being paged to determine if pt is appropriate for stepdown bed

## 2017-04-11 NOTE — ED Triage Notes (Signed)
Pt coming from cancer center. Pt brought in after labs showed abnormal results. Pt being treated for lung cancer. Cancer center nurse reports that pt complains of increased thirst and increased urination. Labs revealed a glucose of 678. Pt reports taking an oral steroid for the past 3 weeks. Pt AO x4.

## 2017-04-11 NOTE — Assessment & Plan Note (Signed)
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  The patient was seen with Dr. Julien Nordmann.  Biopsy results were discussed with the patient and her family.  We discussed that we did not have enough tissue to get all of the molecular testing done that we would like.  We talked about sending the patient's blood for [employer] testing.  This will take 7-10 days to get the results back.  Lab sample was sent today.   Lab results were discussed with the patient and her family.  The patient's blood sugar is significantly elevated at 678.  We advised the patient to be evaluated in the emergency room for this elevated blood sugar.  She was advised to discontinue the dexamethasone.  The patient was advised to call immediately if she has any concerning symptoms in the interval. The patient voices understanding of current disease status and treatment options and is in agreement with the current care plan.  All questions were answered. The patient knows to call the clinic with any problems, questions or concerns. We can certainly see the patient much sooner if necessary.

## 2017-04-11 NOTE — H&P (Addendum)
[facility] History and Physical  [name] UXN:[id number] DOB: 26-Jan-1946 DOA: 04/11/2017   PCP: [name], MD  Specialists: Dr. Julien Nordmann is her oncologist  Chief Complaint: Elevated blood glucose levels  HPI: [name] is a 71 y.o. 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  She presented to the cancer center for routine follow-up.  She complained of feeling very fatigued.  Complained of feeling lightheaded at times.  Her blood glucose level was checked and was noted to be greater than 600.  She was sent over to the emergency department for further management.  While evaluating the patient she suddenly developed chest pain.  Heart rate had increased to 150.  EKG was done which revealed atrial fibrillation with RVR.  Patient was given Cardizem.  Cardiology was consulted.  Hemodynamically patient remained stable.  With Cardizem patient converted to sinus rhythm.  Her chest pain resolved.  She will need hospitalization for further management of the above-mentioned issues.  Home Medications: Prior to Admission medications   Medication Sig Start Date End Date Taking? Authorizing Provider  aspirin 81 MG tablet Take 81 mg by mouth daily.   Yes [provider]  citalopram (CELEXA) 20 MG tablet Take 20 mg daily by mouth.    Yes [provider]  dexamethasone (DECADRON) 4 MG tablet Take 1 tablet (4 mg total) by mouth 2 (two) times daily with a meal. 03/27/17  Yes Patrecia Pour, MD  Glycopyrrolate-Formoterol (BEVESPI AEROSPHERE) 9-4.8 MCG/ACT AERO Inhale 2 puffs 2 (two) times daily into the lungs. 04/08/17  Yes Nestor, Sonia Baller, MD  Guaifenesin [facility] MAXIMUM STRENGTH) 1200 MG TB12 Take 1,200 mg by mouth 2 (two) times daily.   Yes [provider]  ipratropium-albuterol (DUONEB) 0.5-2.5 (3) MG/3ML SOLN Take 3 mLs by nebulization every 6 (six) hours as needed. Patient taking differently: Take 3 mLs by nebulization every 6 (six) hours as needed (shortness of breath).  03/08/17  Yes Javier Glazier, MD  losartan (COZAAR) 50 MG tablet Take 50 mg by mouth daily. 12/26/16  Yes [provider]  non-metallic deodorant Jethro Poling) MISC Apply 1 application topically. 04/03/17  Yes Hayden Pedro, PA-C  nystatin (MYCOSTATIN) 100000 UNIT/ML suspension Take 5 mLs (500,000 Units total) 3 (three) times daily by mouth. Make sure to swish and swallow. 04/08/17  Yes Javier Glazier, MD  polyethylene glycol [facility] / GLYCOLAX) packet Take 17 g by mouth daily. Patient taking differently: Take 17 g by mouth daily as needed for mild constipation.  02/27/17  Yes Lavina Hamman, MD  Respiratory Therapy Supplies (FLUTTER) DEVI 1 Device as needed by Does not apply route. 04/08/17  Yes Javier Glazier, MD  simvastatin (ZOCOR) 40 MG tablet Take 40 mg by mouth every evening.   Yes [provider]  Spacer/Aero Chamber Mouthpiece MISC 1 Device by Does not apply route as directed. 03/08/17  Yes Javier Glazier, MD  Wound Dressings (SONAFINE) Apply 1 application topically 2 (two) times daily. Apply after rad txs and at bedtimes daily,nothing 4 hours prior to rad tx 03/29/17  Yes Hayden Pedro, PA-C  acetaminophen (TYLENOL) 500 MG tablet Take 500 mg by mouth every 8 (eight) hours as needed for mild pain or headache.    [provider]  Allergies:  Allergies  Allergen Reactions  . Penicillins Hives    Has patient had a PCN reaction causing immediate rash, facial/tongue/throat swelling, SOB or lightheadedness with hypotension: yes Has patient had a PCN reaction causing severe rash involving mucus membranes or skin necrosis: no Has patient had a PCN reaction that required hospitalization: yes Has patient had a  PCN reaction occurring within the last 10 years: no If all of the above answers are "NO", then may proceed with Cephalosporin use.   . Tiotropium Bromide Monohydrate Other (See Comments)    EYE PAIN KIDNEY FUNCTION SLOWED DOWN    Past Medical History: Past Medical History:  Diagnosis Date  . Arthritis   . Asthma   . COPD (chronic obstructive pulmonary disease) ([place])   . Depression   . Diabetes mellitus    diet controlled  . History of hiatal hernia   . Hyperlipemia   . Hypertension   . Pneumonia     Past Surgical History:  Procedure Laterality Date  . CARDIAC CATHETERIZATION  1999  . CARPAL TUNNEL RELEASE  2/13   right-[facility]  . CARPAL TUNNEL RELEASE  08/15/2011   Procedure: CARPAL TUNNEL RELEASE;  Surgeon: Linna Hoff, MD;  Location: [facility];  Service: Orthopedics;  Laterality: Left;  . CATARACT EXTRACTION    . CERVICAL FUSION  2002  . COLONOSCOPY    . ENDOBRONCHIAL ULTRASOUND Bilateral 03/18/2017   Procedure: ENDOBRONCHIAL ULTRASOUND;  Surgeon: Javier Glazier, MD;  Location: [facility];  Service: Cardiopulmonary;  Laterality: Bilateral;  . TONSILLECTOMY    . VIDEO BRONCHOSCOPY Bilateral 02/25/2017   Procedure: VIDEO BRONCHOSCOPY WITHOUT FLUORO;  Surgeon: Javier Glazier, MD;  Location: [facility];  Service: Cardiopulmonary;  Laterality: Bilateral;    Social History:   Social History   Socioeconomic History  . Marital status: Married    Spouse name: Not on file  . Number of children: Not on file  . Years of education: Not on file  . Highest education level: Not on file  Social Needs  . Financial resource strain: Not on file  . Food insecurity - worry: Not on file  . Food insecurity - inability: Not on file  . Transportation needs - medical: Not on file  . Transportation needs - non-medical: Not on file  Occupational History  . Not on file  Tobacco Use  . Smoking status: Former Smoker    Packs/day: 1.00    Years: 53.00    Pack  years: 53.00    Start date: 12/22/1962    Last attempt to quit: 02/25/2017    Years since quitting: 0.1  . Smokeless tobacco: Never Used  . Tobacco comment: Peak rate 1.5ppd - quit at most 6 months  Substance and Sexual Activity  . Alcohol use: No  . Drug use: No  . Sexual activity: Not Currently  Other Topics Concern  . Not on file  Social History Narrative   [facility] (02/23/17):   Originally from [place]. Previously has lived in [place] as well as [place]. Previously worked with a Engineer, structural and in Engineer, materials. Remote exposure to a parrot. Currently lives with her husband. Retired.    Family History:  Family History  Problem Relation Age of Onset  . Diabetes Mother   . Colon cancer Father   . Cerebral palsy Brother   . Breast cancer Paternal Aunt   . Breast cancer Cousin   . Prostate cancer Paternal Uncle   . Bone cancer Maternal Aunt  Review of Systems - History obtained from the patient General ROS: positive for  - fatigue Psychological ROS: negative Ophthalmic ROS: negative ENT ROS: negative Allergy and Immunology ROS: negative Hematological and Lymphatic ROS: negative Endocrine ROS: as in hpi Respiratory ROS: no cough, shortness of breath, or wheezing Cardiovascular ROS: as in hpi Gastrointestinal ROS: no abdominal pain, change in bowel habits, or black or bloody stools Genito-Urinary ROS: no dysuria, trouble voiding, or hematuria Musculoskeletal ROS: negative Neurological ROS: no TIA or stroke symptoms Dermatological ROS: negative  Physical Examination  Vitals:   04/11/17 1108 04/11/17 1310 04/11/17 1509  BP: 115/70 99/65 (!) 109/59  Pulse: (!) 105 99 85  Resp: 18 16 (!) 22  Temp: 97.7 F (36.5 C)    TempSrc: Oral    SpO2: 94% 91% 95%    BP (!) 109/59 (BP Location: Left Arm)   Pulse 85   Temp 97.7 F (36.5 C) (Oral)   Resp (!) 22   SpO2 95%   General appearance: alert, cooperative, appears stated age and no  distress Head: Normocephalic, without obvious abnormality, atraumatic Eyes: conjunctivae/corneas clear. PERRL, EOM's intact.  Throat: lips, mucosa, and tongue normal; teeth and gums normal Neck: no adenopathy, no carotid bruit, no JVD, supple, symmetrical, trachea midline and thyroid not enlarged, symmetric, no tenderness/mass/nodules Resp: clear to auscultation bilaterally Cardio: regular rate and rhythm, S1, S2 normal, no murmur, click, rub or gallop GI: soft, non-tender; bowel sounds normal; no masses,  no organomegaly Extremities: extremities normal, atraumatic, no cyanosis or edema Pulses: 2+ and symmetric Skin: Skin color, texture, turgor normal. No rashes or lesions Lymph nodes: Cervical, supraclavicular, and axillary nodes normal. Neurologic: Awake and alert.  Cranial nerves II through XII intact.  Motor strength equal bilateral upper and lower extremities.    Labs on Admission: I have personally reviewed following labs and imaging studies  CBC: Recent Labs  Lab 04/11/17 0852  WBC 18.6*  NEUTROABS 17.0*  HGB 13.6  HCT 42.6  MCV 99.1  PLT 865   Basic Metabolic Panel: Recent Labs  Lab 04/11/17 0852  NA 127*  K 5.7 No visable hemolysis*  CO2 24  GLUCOSE 678*  BUN 44.9*  CREATININE 1.2*  CALCIUM 8.7   GFR: Estimated Creatinine Clearance: 31.8 mL/min (A) (by C-G formula based on SCr of 1.2 mg/dL (H)). Liver Function Tests: Recent Labs  Lab 04/11/17 0852  AST 8  ALT 26  ALKPHOS 65  BILITOT 0.84  PROT 5.6*  ALBUMIN 3.2*   CBG: Recent Labs  Lab 04/11/17 1217 04/11/17 1319 04/11/17 1438  GLUCAP 541* 419* 271*    Radiological Exams on Admission: Dg Chest 2 View  Result Date: 04/11/2017 CLINICAL DATA:  Hyperglycemia.  History of lung cancer. EXAM: CHEST  2 VIEW COMPARISON:  Chest CT 03/25/2017 FINDINGS: Chronic right upper lobe collapse. Known chronic nodular opacity in the left mid lung. No evidence of pneumonia or edema. No effusion or pneumothorax.  Normal heart size. IMPRESSION: Known right upper lobe collapse and cavitary left lung nodule. No acute superimposed finding. Electronically Signed   By: Monte Fantasia M.D.   On: 04/11/2017 11:46    My interpretation of Electrocardiogram: Initial EKG showed sinus rhythm without significant ST or T wave changes.  Subsequent EKG shows atrial fibrillation with RVR.   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  While in the ED she developed atrial fibrillation with RVR.  Plan: #1 hyperglycemia secondary to steroids: Patient has a history of diet-controlled diabetes but never experienced significantly high glucose levels previously.  She was started on dexamethasone during her last hospitalization for SVC syndrome.  Patient had a blood glucose level greater than 600 today.  She was sent over to the ED.  She was started on IV insulin.  Blood glucose levels have significantly improved.  She has been taken off of IV insulin.  We will check HbA1c.  She has been taken off of steroids by her oncologist.  Her glucose level should stabilize.  We will give her Lantus tonight.  Sliding scale insulin coverage.  #2  Atrial fibrillation with RVR: She was given Cardizem and is on a Cardizem infusion.  She has converted to sinus rhythm.  Cardiology recommends continuing the infusion for tonight.  Plan will be to transition her to oral Cardizem tomorrow.  Recent echocardiogram done in September showed normal systolic function.  TSH was 1.996 in October.  Current episode of atrial fibrillation appears  to have been triggered by steroids and high glucose levels.  Cardiology to determine need for anticoagulation.  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  This appears to have improved with radiation.  She was also given steroids as discussed above.  She has been taken off of it now.  Continue to monitor.  #4  Mass of the right lung highly suspicious for non-small cell cancer: She is undergoing radiation therapy.  Further management as per oncology team.  #5 history of COPD: Appears to be stable.  Continue home medications.  #6 dehydration with mildly elevated BUN and she has been given IV fluids in the ED.  Repeat labs tomorrow morning.  #7 leukocytosis: Likely reactive.  No evidence for acute infection.  She is afebrile.  Continue to monitor.  #8 hyponatremia and hyperkalemia: Hyponatremia is pseudohyponatremia due to hyperglycemia.  Potassium level should have improved with insulin treatment.  We will recheck labs.  DVT Prophylaxis: Subcutaneous heparin Code Status: Full code Family Communication: Discussed with the patient Consults called: Cardiology  Severity of Illness: The appropriate patient status for this patient is INPATIENT. Inpatient status is judged to be reasonable and necessary in order to provide the required intensity of service to ensure the patient's safety. The patient's presenting symptoms, physical exam findings, and initial radiographic and laboratory data in the context of their chronic comorbidities is felt to place them at high risk for further clinical deterioration. Furthermore, it is not anticipated that the patient will be medically stable for discharge from the hospital within 2 midnights of admission. The following factors support the patient status of inpatient.   " The patient's presenting symptoms include generalized weakness. " The worrisome physical exam findings include elevated heart rate. " The initial radiographic and  laboratory data are worrisome because of hyperglycemia, rapid A. fib. " The chronic co-morbidities include COPD.   * I certify that at the point of admission it is my clinical judgment that the patient will require inpatient hospital care spanning beyond 2 midnights from the point of admission due to high intensity of service, high  risk for further deterioration and high frequency of surveillance required.*  Further management decisions will depend on results of further testing and patient's response to treatment.   Bonnielee Haff  [facility] Pager [phone]  If 7PM-7AM, please contact night-coverage [email or web address] Password [email or web address]  04/11/2017, 5:13 PM

## 2017-04-11 NOTE — Progress Notes (Signed)
Due to abnormal labs (Glucose =678), pt taken to ED room #14 after speaking with charge nurse.  Transported via w/c with husband in [place]. Report given to Loma Sousa, RN  Pt and husband verbalize understanding of need for ED.  Call made to [facility] to advise them of her admission to ED and that her XRT is cancelled for today.

## 2017-04-11 NOTE — Progress Notes (Signed)
CRITICAL VALUE ALERT  Critical Value:  0.06 troponin   Date & Time Notied: 04/11/2017 1930H  Provider Notified: Yes  Orders Received/Actions taken: 250 NS bolus  Will continue to monitor.  Naomie Dean, RN

## 2017-04-11 NOTE — Progress Notes (Signed)
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  PRIOR THERAPY: None  CURRENT THERAPY: [name] is undergoing a course of radiation to her right upper lobe lung mass.  INTERVAL HISTORY: [name] 71 y.o. female returns for routine follow-up visit accompanied by her husband and daughter.  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  Her lung biopsy was obtained during her hospitalization and she was started on radiation to Kelsey lung mass.  [name] feels very fatigued and lightheaded at times.  She denies fevers and chills.  Denies chest pain and hemoptysis.  She does have shortness of breath at rest and wears oxygen at home, but does not have it with her today.  She reports an ongoing cough.  Denies nausea and vomiting.  Denies constipation diarrhea.  She has lost more weight.  She reports that she is drinking a lot of fluids and has to void frequently.  Denies dysuria and hematuria.  [name] is here for repeat lab work and to discuss her biopsy results and treatment options.  MEDICAL HISTORY: Past Medical History:  Diagnosis Date  . Arthritis   . Asthma   . COPD (chronic obstructive pulmonary disease) ([place])   . Depression   . Diabetes mellitus    diet controlled  . History of hiatal hernia   . Hyperlipemia   . Hypertension   . Pneumonia     ALLERGIES:  is allergic to penicillins and tiotropium bromide monohydrate.  MEDICATIONS:  No current facility-administered medications for this visit.    Current Outpatient Medications  Medication Sig Dispense Refill  . acetaminophen (TYLENOL) 500 MG tablet Take 500 mg by mouth  every 8 (eight) hours as needed for mild pain or headache.    Marland Kitchen aspirin 81 MG tablet Take 81 mg by mouth daily.    . citalopram (CELEXA) 20 MG tablet Take 10 mg by mouth daily.     Marland Kitchen dexamethasone (DECADRON) 4 MG tablet Take 1 tablet (4 mg total) by mouth 2 (two) times daily with a meal. 30 tablet 0  . Glycopyrrolate-Formoterol (BEVESPI AEROSPHERE) 9-4.8 MCG/ACT AERO Inhale 2 puffs 2 (two) times daily into Kelsey lungs. 1 Inhaler 0  . Guaifenesin (MUCINEX MAXIMUM STRENGTH) 1200 MG TB12 Take 1,200 mg by mouth 2 (two) times daily.    Marland Kitchen ipratropium-albuterol (DUONEB) 0.5-2.5 (3) MG/3ML SOLN Take 3 mLs by nebulization every 6 (six) hours as needed. (Baxter taking differently: Take 3 mLs by nebulization every 6 (six) hours as needed (shortness of breath). ) 120 mL 3  . losartan (COZAAR) 50 MG tablet Take 50 mg by mouth daily.  0  . non-metallic deodorant (ALRA) MISC Apply 1 application topically.    . nystatin (MYCOSTATIN) 100000 UNIT/ML suspension Take 5 mLs (500,000 Units total) 3 (three) times daily by mouth. Make sure to swish and swallow. 150 mL 1  . polyethylene glycol (MIRALAX / GLYCOLAX) packet Take 17 g by mouth daily. (Baxter taking differently: Take 17 g by mouth daily as needed for mild constipation. ) 14 each 0  . Respiratory Therapy Supplies (FLUTTER) DEVI 1 Device as needed by Does not apply route. 1 each 0  . simvastatin (ZOCOR)  40 MG tablet Take 40 mg by mouth every evening.    Marland Kitchen Spacer/Aero Chamber Mouthpiece MISC 1 Device by Does not apply route as directed. 1 each 0  . Wound Dressings (SONAFINE) Apply 1 application topically 2 (two) times daily. Apply after rad txs and at bedtimes daily,nothing 4 hours prior to rad tx     Facility-Administered Medications Ordered in Other Visits  Medication Dose Route Frequency Provider Last Rate Last Dose  . sodium chloride 0.9 % bolus 1,000 mL  1,000 mL Intravenous Once Dorie Rank, MD       And  . sodium chloride 0.9 % bolus 1,000 mL  1,000 mL  Intravenous Once Dorie Rank, MD       And  . 0.9 %  sodium chloride infusion   Intravenous Continuous Dorie Rank, MD      . insulin regular (NOVOLIN R,HUMULIN R) 100 Units in sodium chloride 0.9 % 100 mL (1 Units/mL) infusion   Intravenous Continuous Dorie Rank, MD        SURGICAL HISTORY:  Past Surgical History:  Procedure Laterality Date  . CARDIAC CATHETERIZATION  1999  . CARPAL TUNNEL RELEASE  2/13   right-[facility]  . CARPAL TUNNEL RELEASE  08/15/2011   Procedure: CARPAL TUNNEL RELEASE;  Surgeon: Linna Hoff, MD;  Location: [facility];  Service: Orthopedics;  Laterality: Left;  . CATARACT EXTRACTION    . CERVICAL FUSION  2002  . COLONOSCOPY    . ENDOBRONCHIAL ULTRASOUND Bilateral 03/18/2017   Procedure: ENDOBRONCHIAL ULTRASOUND;  Surgeon: Javier Glazier, MD;  Location: [facility];  Service: Cardiopulmonary;  Laterality: Bilateral;  . TONSILLECTOMY    . VIDEO BRONCHOSCOPY Bilateral 02/25/2017   Procedure: VIDEO BRONCHOSCOPY WITHOUT FLUORO;  Surgeon: Javier Glazier, MD;  Location: [facility];  Service: Cardiopulmonary;  Laterality: Bilateral;    REVIEW OF SYSTEMS:   Review of Systems  Constitutional: Negative for appetite change, chills, fever.  Positive for fatigue and weight loss.  HENT:   Negative for mouth sores, nosebleeds, sore throat and trouble swallowing.   Eyes: Negative for eye problems and icterus.  Respiratory: Negative for hemoptysis and wheezing.  Positive for cough and shortness of breath. Cardiovascular: Negative for chest pain and leg swelling.  Gastrointestinal: Negative for abdominal pain, constipation, diarrhea, nausea and vomiting.  Genitourinary: Negative for bladder incontinence, difficulty urinating, dysuria, and hematuria.  Positive for frequency. Musculoskeletal: Negative for back pain, gait problem, neck pain and neck stiffness.  Skin: Negative for itching and rash.  Neurological: Negative for extremity weakness, gait problem,  headaches, and seizures. Positive for lightheadedness. Hematological: Negative for adenopathy. Does not bruise/bleed easily.  Psychiatric/Behavioral: Negative for confusion, depression and sleep disturbance. [name] is not nervous/anxious.     PHYSICAL EXAMINATION:  Blood pressure (!) 115/58, pulse (!) 111, temperature 97.6 F (36.4 C), temperature source Oral, resp. rate 18, height 5' (1.524 m), weight 114 lb 11.2 oz (52 kg), SpO2 94 %.  ECOG PERFORMANCE STATUS: 1 - Symptomatic but completely ambulatory  Physical Exam  Constitutional: Oriented to person, place, and time. No distress.  HENT:  Head: Normocephalic and atraumatic.  Mouth/Throat: Oropharynx is clear and moist. No oropharyngeal exudate.  Eyes: Conjunctivae are normal. Right eye exhibits no discharge. Left eye exhibits no discharge. No scleral icterus.  Neck: Normal range of motion. Neck supple.  Cardiovascular: Normal rate, regular rhythm, normal heart sounds and intact distal pulses.   Pulmonary/Chest: Effort normal and breath sounds normal. No respiratory distress. No wheezes. No  rales.  Abdominal: Soft. Bowel sounds are normal. Exhibits no distension and no mass. There is no tenderness.  Musculoskeletal: Normal range of motion. Exhibits no edema.  Lymphadenopathy:    No cervical adenopathy.  Neurological: Alert and oriented to person, place, and time. Exhibits normal muscle tone. Gait normal. Coordination normal.  Skin: Skin is warm and dry. No rash noted. Not diaphoretic. No erythema. No pallor.  Psychiatric: Mood, memory and judgment normal.  Vitals reviewed.  LABORATORY DATA: Lab Results  Component Value Date   WBC 18.6 (H) 04/11/2017   HGB 13.6 04/11/2017   HCT 42.6 04/11/2017   MCV 99.1 04/11/2017   PLT 213 04/11/2017      Chemistry      Component Value Date/Time   NA 127 (L) 04/11/2017 0852   K 5.7 No visable hemolysis (H) 04/11/2017 0852   CL 99 (L) 03/27/2017 0522   CO2 24 04/11/2017 0852   BUN  44.9 (H) 04/11/2017 0852   CREATININE 1.2 (H) 04/11/2017 0852      Component Value Date/Time   CALCIUM 8.7 04/11/2017 0852   ALKPHOS 65 04/11/2017 0852   AST 8 04/11/2017 0852   ALT 26 04/11/2017 0852   BILITOT 0.84 04/11/2017 0852       RADIOGRAPHIC STUDIES:  Ct Angio Head W Or Wo Contrast  Result Date: 03/15/2017 CLINICAL DATA:  Visual loss and throat swelling over several weeks. EXAM: CT ANGIOGRAPHY HEAD AND NECK TECHNIQUE: Multidetector CT imaging of Kelsey head and neck was performed using Kelsey standard protocol during bolus administration of intravenous contrast. Multiplanar CT image reconstructions and MIPs were obtained to evaluate Kelsey vascular anatomy. Carotid stenosis measurements (when applicable) are obtained utilizing NASCET criteria, using Kelsey distal internal carotid diameter as Kelsey denominator. CONTRAST:  100 mL Isovue 370 COMPARISON:  Chest CT 02/23/2017. FINDINGS: CT HEAD FINDINGS Brain: There is no evidence of acute infarct, intracranial hemorrhage, mass, midline shift, or extra-axial fluid collection. Kelsey ventricles and sulci are within normal limits for age. Periventricular white matter hypodensities are nonspecific but compatible with minimal chronic small vessel ischemic disease. Vascular: Calcified atherosclerosis at Kelsey skullbase. Skull: No fracture or focal osseous lesion. Sinuses: Minimal right ethmoid air cell mucosal thickening. Clear mastoid air cells. Orbits: Bilateral cataract extraction. Review of Kelsey MIP images confirms Kelsey above findings CTA NECK FINDINGS Aortic arch: Standard 3 vessel aortic arch with moderate calcified plaque. Likely mild stenosis of Kelsey proximal brachiocephalic and left subclavian arteries. Limited assessment of Kelsey right subclavian artery due to calcified plaque and Kelsey adjacent dense venous contrast with resulting streak artifact. Right carotid system: Moderate calcified plaque at Kelsey carotid bifurcation and in Kelsey proximal ICA without significant  common or internal carotid artery stenosis. Mild-to-moderate ECA origin stenosis. Left carotid system: Mild plaque at Kelsey carotid bifurcation without stenosis. Vertebral arteries: Patent with Kelsey left being dominant. No evidence of significant stenosis. Skeleton: Congenital C2-3 fusion. C5-6 ACDF with solid fusion. C4-5 disc degeneration with bulging, annular calcification and uncovertebral spurring resulting in suspected moderate spinal stenosis and left greater than right neural foraminal stenosis. Other neck: Asymmetric effacement of Kelsey right piriform sinus by small volume mildly nodular soft tissue and possibly trace fluid. Mild diffuse subcutaneous and deep fat stranding/edema throughout Kelsey neck bilaterally. Mild retropharyngeal edema without fluid collection. Upper chest: Partially visualized right hilar/central right upper lobe mass as demonstrated on Kelsey prior chest CT with increased right upper lobe collapse/ consolidation. Small right pleural effusion. SCC narrowing. Right upper lobe bronchus encasement. Review  of Kelsey MIP images confirms Kelsey above findings CTA HEAD FINDINGS Anterior circulation: Kelsey internal carotid arteries are patent from skullbase to carotid termini. There is minimal carotid siphon atherosclerosis without significant stenosis. ACAs and MCAs are patent without evidence of proximal branch occlusion or significant stenosis. No aneurysm. Posterior circulation: Kelsey intracranial vertebral arteries are patent to Kelsey basilar. Patent PICA and SCA origins are identified bilaterally. Kelsey basilar artery is patent and mildly small in caliber diffusely on a congenital basis. There are patent posterior communicating arteries with hypoplastic P1 segments bilaterally. No significant proximal PCA stenosis. No aneurysm. Venous sinuses: Patent. Anatomic variants: Hypoplastic P1 segments. Delayed phase: No abnormal enhancement. Review of Kelsey MIP images confirms Kelsey above findings IMPRESSION: 1. No large  vessel occlusion or significant intracranial arterial stenosis. 2. Right greater than left cervical carotid artery atherosclerosis without significant common or internal carotid artery stenosis. Mild-to-moderate right ECA origin stenosis. 3. Aortic Atherosclerosis (ICD10-I70.0). Mild proximal brachiocephalic and left subclavian artery stenosis. 4. Partially visualized right hilar mass as seen on recent chest CT with progressive right upper lobe collapse/consolidation. SVC narrowing with multiple collateral vessels. 5. Mild diffuse edema in Kelsey neck which may be secondary to SVC obstruction. 6. Asymmetric, mildly nodular soft tissue at Kelsey right tongue base. Consider direct visualization. 7. No evidence of acute intracranial abnormality or intracranial metastases. Electronically Signed   By: Logan Bores M.D.   On: 03/15/2017 09:29   Dg Chest 2 View  Result Date: 04/11/2017 CLINICAL DATA:  Hyperglycemia.  History of lung cancer. EXAM: CHEST  2 VIEW COMPARISON:  Chest CT 03/25/2017 FINDINGS: Chronic right upper lobe collapse. Known chronic nodular opacity in Kelsey left mid lung. No evidence of pneumonia or edema. No effusion or pneumothorax. Normal heart size. IMPRESSION: Known right upper lobe collapse and cavitary left lung nodule. No acute superimposed finding. Electronically Signed   By: Monte Fantasia M.D.   On: 04/11/2017 11:46   Ct Angio Neck W Or Wo Contrast  Result Date: 03/15/2017 CLINICAL DATA:  Visual loss and throat swelling over several weeks. EXAM: CT ANGIOGRAPHY HEAD AND NECK TECHNIQUE: Multidetector CT imaging of Kelsey head and neck was performed using Kelsey standard protocol during bolus administration of intravenous contrast. Multiplanar CT image reconstructions and MIPs were obtained to evaluate Kelsey vascular anatomy. Carotid stenosis measurements (when applicable) are obtained utilizing NASCET criteria, using Kelsey distal internal carotid diameter as Kelsey denominator. CONTRAST:  100 mL Isovue 370  COMPARISON:  Chest CT 02/23/2017. FINDINGS: CT HEAD FINDINGS Brain: There is no evidence of acute infarct, intracranial hemorrhage, mass, midline shift, or extra-axial fluid collection. Kelsey ventricles and sulci are within normal limits for age. Periventricular white matter hypodensities are nonspecific but compatible with minimal chronic small vessel ischemic disease. Vascular: Calcified atherosclerosis at Kelsey skullbase. Skull: No fracture or focal osseous lesion. Sinuses: Minimal right ethmoid air cell mucosal thickening. Clear mastoid air cells. Orbits: Bilateral cataract extraction. Review of Kelsey MIP images confirms Kelsey above findings CTA NECK FINDINGS Aortic arch: Standard 3 vessel aortic arch with moderate calcified plaque. Likely mild stenosis of Kelsey proximal brachiocephalic and left subclavian arteries. Limited assessment of Kelsey right subclavian artery due to calcified plaque and Kelsey adjacent dense venous contrast with resulting streak artifact. Right carotid system: Moderate calcified plaque at Kelsey carotid bifurcation and in Kelsey proximal ICA without significant common or internal carotid artery stenosis. Mild-to-moderate ECA origin stenosis. Left carotid system: Mild plaque at Kelsey carotid bifurcation without stenosis. Vertebral arteries: Patent with Kelsey left  being dominant. No evidence of significant stenosis. Skeleton: Congenital C2-3 fusion. C5-6 ACDF with solid fusion. C4-5 disc degeneration with bulging, annular calcification and uncovertebral spurring resulting in suspected moderate spinal stenosis and left greater than right neural foraminal stenosis. Other neck: Asymmetric effacement of Kelsey right piriform sinus by small volume mildly nodular soft tissue and possibly trace fluid. Mild diffuse subcutaneous and deep fat stranding/edema throughout Kelsey neck bilaterally. Mild retropharyngeal edema without fluid collection. Upper chest: Partially visualized right hilar/central right upper lobe mass as  demonstrated on Kelsey prior chest CT with increased right upper lobe collapse/ consolidation. Small right pleural effusion. SCC narrowing. Right upper lobe bronchus encasement. Review of Kelsey MIP images confirms Kelsey above findings CTA HEAD FINDINGS Anterior circulation: Kelsey internal carotid arteries are patent from skullbase to carotid termini. There is minimal carotid siphon atherosclerosis without significant stenosis. ACAs and MCAs are patent without evidence of proximal branch occlusion or significant stenosis. No aneurysm. Posterior circulation: Kelsey intracranial vertebral arteries are patent to Kelsey basilar. Patent PICA and SCA origins are identified bilaterally. Kelsey basilar artery is patent and mildly small in caliber diffusely on a congenital basis. There are patent posterior communicating arteries with hypoplastic P1 segments bilaterally. No significant proximal PCA stenosis. No aneurysm. Venous sinuses: Patent. Anatomic variants: Hypoplastic P1 segments. Delayed phase: No abnormal enhancement. Review of Kelsey MIP images confirms Kelsey above findings IMPRESSION: 1. No large vessel occlusion or significant intracranial arterial stenosis. 2. Right greater than left cervical carotid artery atherosclerosis without significant common or internal carotid artery stenosis. Mild-to-moderate right ECA origin stenosis. 3. Aortic Atherosclerosis (ICD10-I70.0). Mild proximal brachiocephalic and left subclavian artery stenosis. 4. Partially visualized right hilar mass as seen on recent chest CT with progressive right upper lobe collapse/consolidation. SVC narrowing with multiple collateral vessels. 5. Mild diffuse edema in Kelsey neck which may be secondary to SVC obstruction. 6. Asymmetric, mildly nodular soft tissue at Kelsey right tongue base. Consider direct visualization. 7. No evidence of acute intracranial abnormality or intracranial metastases. Electronically Signed   By: Logan Bores M.D.   On: 03/15/2017 09:29   Nm Pet  Image Initial (pi) Skull Base To Thigh  Result Date: 03/25/2017 CLINICAL DATA:  Initial treatment strategy for lung mass. EXAM: NUCLEAR MEDICINE PET SKULL BASE TO THIGH TECHNIQUE: 6.3 MCi F-18 FDG was injected intravenously. Full-ring PET imaging was performed from Kelsey skull base to thigh after Kelsey radiotracer. CT data was obtained and used for attenuation correction and anatomic localization. FASTING BLOOD GLUCOSE:  Value: 180 mg/dl COMPARISON:  CT chest 02/23/2017 FINDINGS: NECK: No hypermetabolic lymph nodes in Kelsey neck. CHEST: Small right pleural effusion noted. Kelsey central right perihilar and right paratracheal lung mass exhibits intense FDG uptake with an SUV max equal to 6.28. There is occlusion of Kelsey right upper lobe bronchus with postobstructive consolidation and atelectasis which is now involving Kelsey entire right upper lobe. There is a cavitary lung nodule within Kelsey left upper lobe which measures 2.4 cm and has an SUV max equal to 9.73. No hypermetabolic sub- carinal, left-sided mediastinal or left hilar hypermetabolic adenopathy. ABDOMEN/PELVIS: No abnormal hypermetabolic activity within Kelsey liver, pancreas, adrenal glands, or spleen. Aortic atherosclerosis noted. No significant FDG uptake associated with Kelsey low-attenuation right adrenal nodule compatible with benign adenoma. No hypermetabolic lymph nodes in Kelsey abdomen or pelvis. SKELETON: No focal hypermetabolic activity to suggest skeletal metastasis. IMPRESSION: 1. Central obstructing right paratracheal and right hilar lung mass exhibits intense FDG uptake and there is associated complete atelectasis and  consolidation of Kelsey right upper lobe. 2. Cavitary lung lesion within Kelsey left upper lobe is also hypermetabolic and may represent a focus of metastatic disease or a second primary lung neoplasm. 3. No evidence for distant hypermetabolic metastasis 4. Aortic atherosclerosis. Electronically Signed   By: Kerby Moors M.D.   On: 03/25/2017 09:05    Ct Biopsy  Result Date: 03/27/2017 CLINICAL DATA:  Obstructing hypermetabolic right upper lobe mass. Bronchoscopic biopsies have been nondiagnostic. Percutaneous biopsy is requested. Additional cavitary lingular lesion. EXAM: CT GUIDED CORE BIOPSY OF RIGHT UPPER LOBE LUNG MASS ANESTHESIA/SEDATION: Intravenous Fentanyl and Versed were administered as conscious sedation during continuous monitoring of [name]'s level of consciousness and physiological / cardiorespiratory status by Kelsey radiology RN, with a total moderate sedation time of 11 minutes. PROCEDURE: Kelsey procedure risks, benefits, and alternatives were explained to [name]. Questions regarding Kelsey procedure were encouraged and answered. [name] understands and consents to Kelsey procedure. Select axial scans through Kelsey thorax were obtained. Kelsey dominant obstructing right lesion was localized with comparison to previous PET-CT using consistent anatomic landmarks. An appropriate skin entry site was determined and marked. Kelsey operative field was prepped with chlorhexidinein a sterile fashion, and a sterile drape was applied covering Kelsey operative field. A sterile gown and sterile gloves were used for Kelsey procedure. Local anesthesia was provided with 1% Lidocaine. Under CT fluoroscopic guidance, a 17 gauge trocar needle was advanced to Kelsey margin of Kelsey lesion. Once needle tip position was confirmed at Kelsey periphery of Kelsey lesion, multiple coaxial 18-gauge 3 cm core biopsy samples were obtained, submitted in formalin to surgical pathology. Kelsey guide needle was removed. Postprocedure scans show a small amount of gas in Kelsey pleural space anterior to Kelsey collapsed right upper lobe despite Kelsey fact that Kelsey needle did not traverse aerated lung, suggesting some gas was entrained through Kelsey guide needle during biopsy. This was stable on subsequent imaging over 13 minutes, although a developing right pleural effusion was identified. Baxter remained  asymptomatic and hemodynamically stable for additional 30 minutes of observation. [name] tolerated Kelsey procedure well. COMPLICATIONS: Small pneumothorax and right pleural effusion, asymptomatic. SIR level A: No therapy, no consequence. FINDINGS: Kelsey right upper lobe lesion was localized with comparison to recent PET-CT. Representative core biopsy samples were obtained. Small asymptomatic right pneumothorax and pleural effusion noted post biopsy. IMPRESSION: 1. Technically successful CT-guided core biopsy, right upper lobe lung lesion. Electronically Signed   By: Lucrezia Europe M.D.   On: 03/27/2017 12:55   Dg Chest Port 1 View  Result Date: 03/18/2017 CLINICAL DATA:  Status post lymph node biopsy EXAM: PORTABLE CHEST 1 VIEW COMPARISON:  Chest radiograph February 22, 2017 and chest CT February 23, 2017 FINDINGS: No demonstrable pneumothorax. There is volume loss Kelsey right with airspace consolidation in Kelsey right upper lobe. Kelsey area of apparent mass in Kelsey right upper lobe is not well seen by radiography. Adenopathy is noted in Kelsey right hilar region. Kelsey left lung is clear except for mild left midlung and left base atelectatic change. Heart size is normal. There is aortic atherosclerosis. There is postoperative change in Kelsey lower cervical spine. IMPRESSION: No pneumothorax. Opacity with volume loss right upper lobe. Previously noted masslike area right upper lobe not well seen currently. There is adenopathy in Kelsey right hilar region. No edema or consolidation on Kelsey left. Areas of atelectasis noted in left mid lung and left base. There is aortic atherosclerosis. Aortic Atherosclerosis (ICD10-I70.0). Electronically Signed  By: Lowella Grip III M.D.   On: 03/18/2017 10:06    PATHOLOGY:   Diagnosis Lung, needle/core biopsy(ies), RUL mass - POORLY DIFFERENTIATED CARCINOMA, SEE COMMENT. Microscopic Comment TTF-1, NapsinA, and CDX-2 are negative. Dr. Lyndon Code has reviewed Kelsey case. Kelsey case was called  to [facility]. [facility] on 03/28/2017. There is limited tumor for ancillary testing, but Foundation One and PDL-1 will be attempted. Vicente Males MD Pathologist, Electronic Signature (Case signed 04/01/2017)  ASSESSMENT/PLAN:  Malignant neoplasm of bronchus of right upper lobe [facility]) This is a very pleasant 71 year old white female with highly suspicious stage IV non-small cell lung cancer, poorly differentiated carcinoma,  presented with right upper lobe lung mass in addition to right hilar and mediastinal lymphadenopathy as well as necrotic left upper lobe nodule.  [name] was seen with Dr. Julien Nordmann.  Biopsy results were discussed with [name] and her family.  We discussed that we did not have enough tissue to get all of Kelsey molecular testing done that we would like.  We talked about sending [name]'s blood for [facility] testing.  This will take 7-10 days to get Kelsey results back.  Lab sample was sent today.   Lab results were discussed with [name] and her family.  [name]'s blood sugar is significantly elevated at 678.  We advised [name] to be evaluated in [facility] emergency room for this elevated blood sugar.  She was advised to discontinue Kelsey dexamethasone.  [name] was advised to call immediately if she has any concerning symptoms in Kelsey interval. [name] voices understanding of current disease status and treatment options and is in agreement with Kelsey current care plan.  All questions were answered. [name] knows to call Kelsey clinic with any problems, questions or concerns. We can certainly see [name] much sooner if necessary.   Orders Placed This Encounter  Procedures  . [facility]    Standing Status:   Future    Number of Occurrences:   1    Standing Expiration Date:   04/11/2018  . CBC with Differential/Platelet    Standing Status:   Future    Standing Expiration Date:   04/11/2018  . Comprehensive metabolic panel    Standing Status:   Future     Standing Expiration Date:   04/11/2018    Mikey Bussing, DNP, AGPCNP-BC, AOCNP 04/11/17  ADDENDUM: Hematology/Oncology Attending: I had a face-to-face encounter with [name].  I recommended her care plan.  This is a very pleasant 71 years old white female recently diagnosed with stage IV non-small cell lung cancer, poorly differentiated carcinoma presented with obstructive right upper lobe lung mass in addition to right hilar and mediastinal lymphadenopathy as well as left upper lobe lung mass.  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 Lisbeth Renshaw.  She is feeling a little bit better and Kelsey swelling in her face and arm had improved. Kelsey tissue block was sent for PDL 1 expression which was reported to be 40%.  Unfortunately there was insufficient material to do molecular studies. [name] is here today for evaluation and discussion of her treatment options.  I had a lengthy discussion with [name] and her family about her recent pathology and treatment options. I recommended for [name] to complete Kelsey course of palliative radiotherapy under Kelsey care of Dr. Lisbeth Renshaw After completion of her radiation, I would consider [name] for treatment  with systemic chemotherapy with or without Ketruda (pembrolizumab). I will also request a blood sample to be sent to [facility] for molecular studies. For Kelsey significant hyperglycemia, I recommended for [name] to discontinue her current treatment with Decadron.  We will also send [name] to [facility] emergency department for evaluation and management of her hyperglycemia.  She was also advised to discuss with her primary care physician adjustment of her diabetic medications. We will see [name] back for follow-up visit in 2 weeks for reevaluation and discussion of her treatment. She was advised to call immediately if she has any concerning symptoms in Kelsey  interval.  Disclaimer: This note was dictated with voice recognition software. Similar sounding words can inadvertently be transcribed and may be missed upon review. Eilleen Kempf, MD 04/12/17

## 2017-04-11 NOTE — Telephone Encounter (Signed)
Tried to call Mikey Bussing NP back no answer, saw that patint in ED hyperglycemia, notified Shona Simpson, PA 12:47 PM

## 2017-04-11 NOTE — ED Notes (Signed)
Bed: XG33 Expected date:  Expected time:  Means of arrival:  Comments: 71 yo f from cancer center

## 2017-04-11 NOTE — Consult Note (Signed)
CONSULTATION NOTE   Patient Name: [name] Date of Encounter: 04/11/2017 Cardiologist: None (NEW)  Chief Complaint   Weakness  Patient Profile   71 yo female referred to the ER for hyperglycemia, recent diagnosis of NSCLC and found to be in a-fib with RVR.  HPI   [name] is a 71 y.o. female who is being seen today for the evaluation of a-fib with RVR at the request of [name]. [name] unfortunately was recently diagnosed with lung cancer. She was seeing her oncologist today for labwork and recently was given steroids. BG was noted to be >600 and she was referred to the ER. While in the ER, noted to be in an irregularly irregular tachycardia suggestive of a-fib with RVR. Overall, she reports recent weakness, polyuria, polydipsia and shortness of breath.   PMHx   Past Medical History:  Diagnosis Date  . Arthritis   . Asthma   . COPD (chronic obstructive pulmonary disease) ([place])   . Depression   . Diabetes mellitus    diet controlled  . History of hiatal hernia   . Hyperlipemia   . Hypertension   . Pneumonia     Past Surgical History:  Procedure Laterality Date  . CARDIAC CATHETERIZATION  1999  . CARPAL TUNNEL RELEASE  2/13   right-[facility]  . CARPAL TUNNEL RELEASE  08/15/2011   Procedure: CARPAL TUNNEL RELEASE;  Surgeon: [name];  Location: [facility];  Service: Orthopedics;  Laterality: Left;  . CATARACT EXTRACTION    . CERVICAL FUSION  2002  . COLONOSCOPY    . ENDOBRONCHIAL ULTRASOUND Bilateral 03/18/2017   Procedure: ENDOBRONCHIAL ULTRASOUND;  Surgeon: [name];  Location: [facility];  Service: Cardiopulmonary;  Laterality: Bilateral;  . TONSILLECTOMY    . VIDEO BRONCHOSCOPY Bilateral 02/25/2017   Procedure: VIDEO BRONCHOSCOPY WITHOUT FLUORO;  Surgeon: [name];  Location: [facility];  Service: Cardiopulmonary;  Laterality: Bilateral;    FAMHx   Family History  Problem Relation Age of Onset    . Diabetes Mother   . Colon cancer Father   . Cerebral palsy Brother   . Breast cancer Paternal Aunt   . Breast cancer Cousin   . Prostate cancer Paternal Uncle   . Bone cancer Maternal Aunt     SOCHx    reports that she quit smoking about 6 weeks ago. She started smoking about 54 years ago. She has a 53.00 pack-year smoking history. she has never used smokeless tobacco. She reports that she does not drink alcohol or use drugs.  Outpatient Medications   No current facility-administered medications on file prior to encounter.    Current Outpatient Medications on File Prior to Encounter  Medication Sig Dispense Refill  . aspirin 81 MG tablet Take 81 mg by mouth daily.    . citalopram (CELEXA) 20 MG tablet Take 20 mg daily by mouth.     . dexamethasone (DECADRON) 4 MG tablet Take 1 tablet (4 mg total) by mouth 2 (two) times daily with a meal. 30 tablet 0  . Glycopyrrolate-Formoterol (BEVESPI AEROSPHERE) 9-4.8 MCG/ACT AERO Inhale 2 puffs 2 (two) times daily into the lungs. 1 Inhaler 0  . Guaifenesin (MUCINEX MAXIMUM STRENGTH) 1200 MG TB12 Take 1,200 mg by mouth 2 (two) times daily.    Marland Kitchen ipratropium-albuterol (DUONEB) 0.5-2.5 (3) MG/3ML SOLN Take 3 mLs by nebulization every 6 (six) hours as needed. (Patient taking differently: Take 3 mLs by nebulization every 6 (six)  hours as needed (shortness of breath). ) 120 mL 3  . losartan (COZAAR) 50 MG tablet Take 50 mg by mouth daily.  0  . non-metallic deodorant (ALRA) MISC Apply 1 application topically.    . nystatin (MYCOSTATIN) 100000 UNIT/ML suspension Take 5 mLs (500,000 Units total) 3 (three) times daily by mouth. Make sure to swish and swallow. 150 mL 1  . polyethylene glycol (MIRALAX / GLYCOLAX) packet Take 17 g by mouth daily. (Patient taking differently: Take 17 g by mouth daily as needed for mild constipation. ) 14 each 0  . Respiratory Therapy Supplies (FLUTTER) DEVI 1 Device as needed by Does not apply route. 1 each 0  . simvastatin  (ZOCOR) 40 MG tablet Take 40 mg by mouth every evening.    Marland Kitchen Spacer/Aero Chamber Mouthpiece MISC 1 Device by Does not apply route as directed. 1 each 0  . Wound Dressings (SONAFINE) Apply 1 application topically 2 (two) times daily. Apply after rad txs and at bedtimes daily,nothing 4 hours prior to rad tx    . acetaminophen (TYLENOL) 500 MG tablet Take 500 mg by mouth every 8 (eight) hours as needed for mild pain or headache.      Inpatient Medications    Scheduled Meds:   Continuous Infusions: . sodium chloride    . diltiazem (CARDIZEM) infusion 5 mg/hr (04/11/17 1445)  . insulin (NOVOLIN-R) infusion 3.6 Units/hr (04/11/17 1329)    PRN Meds:    ALLERGIES   Allergies  Allergen Reactions  . Penicillins Hives    Has patient had a PCN reaction causing immediate rash, facial/tongue/throat swelling, SOB or lightheadedness with hypotension: yes Has patient had a PCN reaction causing severe rash involving mucus membranes or skin necrosis: no Has patient had a PCN reaction that required hospitalization: yes Has patient had a PCN reaction occurring within the last 10 years: no If all of the above answers are "NO", then may proceed with Cephalosporin use.   . Tiotropium Bromide Monohydrate Other (See Comments)    EYE PAIN KIDNEY FUNCTION SLOWED DOWN    ROS   Pertinent items noted in HPI and remainder of comprehensive ROS otherwise negative.  Vitals   Vitals:   04/11/17 1108 04/11/17 1310  BP: 115/70 99/65  Pulse: (!) 105 99  Resp: 18 16  Temp: 97.7 F (36.5 C)   TempSrc: Oral   SpO2: 94% 91%   No intake or output data in the 24 hours ending 04/11/17 1500 There were no vitals filed for this visit.  Physical Exam   General appearance: alert, appears older than stated age and mild distress Neck: no carotid bruit, no JVD and thyroid not enlarged, symmetric, no tenderness/mass/nodules Lungs: diminished breath sounds bilaterally and rhonchi bilaterally Heart: regular rate  and rhythm Abdomen: soft, non-tender; bowel sounds normal; no masses,  no organomegaly Extremities: extremities normal, atraumatic, no cyanosis or edema Pulses: 2+ and symmetric Skin: pale, warm, dry Neurologic: Grossly normal Psych: Mildly anxious  Labs   Results for orders placed or performed during the hospital encounter of 04/11/17 (from the past 48 hour(s))  Blood gas, venous     Status: None   Collection Time: 04/11/17 12:15 PM  Result Value Ref Range   pH, Ven 7.311 7.250 - 7.430   pCO2, Ven 51.0 44.0 - 60.0 mmHg   pO2, Ven 34.6 32.0 - 45.0 mmHg   Bicarbonate 25.0 20.0 - 28.0 mmol/L   Acid-base deficit 1.5 0.0 - 2.0 mmol/L   O2 Saturation 51.4 %  Patient temperature 98.6    Collection site VENOUS    Drawn by DRAWN BY RN    Sample type VENOUS   CBG monitoring, ED     Status: Abnormal   Collection Time: 04/11/17 12:17 PM  Result Value Ref Range   Glucose-Capillary 541 (HH) 65 - 99 mg/dL  Urinalysis, Routine w reflex microscopic     Status: Abnormal   Collection Time: 04/11/17 12:21 PM  Result Value Ref Range   Color, Urine YELLOW YELLOW   APPearance CLEAR CLEAR   Specific Gravity, Urine 1.031 (H) 1.005 - 1.030   pH 5.0 5.0 - 8.0   Glucose, UA >=500 (A) NEGATIVE mg/dL   Hgb urine dipstick NEGATIVE NEGATIVE   Bilirubin Urine NEGATIVE NEGATIVE   Ketones, ur 20 (A) NEGATIVE mg/dL   Protein, ur NEGATIVE NEGATIVE mg/dL   Nitrite NEGATIVE NEGATIVE   Leukocytes, UA NEGATIVE NEGATIVE   RBC / HPF 0-5 0 - 5 RBC/hpf   WBC, UA 0-5 0 - 5 WBC/hpf   Bacteria, UA NONE SEEN NONE SEEN   Squamous Epithelial / LPF NONE SEEN NONE SEEN  CBG monitoring, ED     Status: Abnormal   Collection Time: 04/11/17  1:19 PM  Result Value Ref Range   Glucose-Capillary 419 (H) 65 - 99 mg/dL  CBG monitoring, ED     Status: Abnormal   Collection Time: 04/11/17  2:38 PM  Result Value Ref Range   Glucose-Capillary 271 (H) 65 - 99 mg/dL    ECG   A-fib with RVR at 163, right atrial enlargement -  Personally Reviewed  Telemetry   PAF - now appears to be in sinus - Personally Reviewed  Radiology   Dg Chest 2 View  Result Date: 04/11/2017 CLINICAL DATA:  Hyperglycemia.  History of lung cancer. EXAM: CHEST  2 VIEW COMPARISON:  Chest CT 03/25/2017 FINDINGS: Chronic right upper lobe collapse. Known chronic nodular opacity in the left mid lung. No evidence of pneumonia or edema. No effusion or pneumothorax. Normal heart size. IMPRESSION: Known right upper lobe collapse and cavitary left lung nodule. No acute superimposed finding. Electronically Signed   By: Monte Fantasia M.D.   On: 04/11/2017 11:46    Cardiac Studies   N/A  Impression   Active Problems:   Malignant neoplasm of bronchus of right upper lobe (HCC)   PAF (paroxysmal atrial fibrillation) (HCC)   Recommendation   1. Mrs. Burlison presented from the oncology clinic with marked hyperglycemia, orthostatic hypotension and weakness. She had a near-syncopal episode in the ER. Noted to have paroxysmal a-fib with RVR - started on diltiazem with improvement in rate and now back in sinus. Her CHADSVASC score is 4 (female, HTN, DM2). Overall duration of a-fib has been brief and I would monitor burdern further to determine if she will need long-term anticoagulation. High dose steroids likely playing a role in her a-fib as well as her hyperglycemia. Agree with hydration and BG control. Echo in 01/2017 showed normal LVEF, grade 1 DD, and reportedly normal biatrial size. For now, continue diltiazem and consider switching to po diltiazem tomorrow if rhythm is stable overnight.  Thanks for the consultation. Cardiology will follow with you.  Time Spent Directly with Patient:  I have spent a total of 45 minutes with the patient reviewing hospital notes, telemetry, EKGs, labs and examining the patient as well as establishing an assessment and plan that was discussed personally with the patient. > 50% of time was spent in direct patient  care.  Length of Stay:  LOS: 0 days   Pixie Casino, Baxter, [facility], [place] Director of the [facility]  Cardiovascular [facility] Reduction Clinic Attending Cardiologist  Direct Dial: [phone]  Fax: [phone]  Website:  [email or web address] 04/11/2017, 3:00 PM

## 2017-04-12 ENCOUNTER — Encounter: Payer: Self-pay | Admitting: *Deleted

## 2017-04-12 ENCOUNTER — Ambulatory Visit
Admission: RE | Admit: 2017-04-12 | Discharge: 2017-04-12 | Disposition: A | Discharge status: Home / Self Care | Payer: Medicare Other | Source: Ambulatory Visit | Attending: Radiation Oncology | Admitting: Radiation Oncology

## 2017-04-12 DIAGNOSIS — N179 Acute kidney failure, unspecified: Secondary | ICD-10-CM

## 2017-04-12 DIAGNOSIS — E860 Dehydration: Secondary | ICD-10-CM

## 2017-04-12 DIAGNOSIS — I4891 Unspecified atrial fibrillation: Secondary | ICD-10-CM

## 2017-04-12 LAB — COMPREHENSIVE METABOLIC PANEL
ALBUMIN: 2.9 g/dL — AB (ref 3.5–5.0)
ALT: 22 U/L (ref 14–54)
ANION GAP: 5 (ref 5–15)
AST: 11 U/L — ABNORMAL LOW (ref 15–41)
Alkaline Phosphatase: 46 U/L (ref 38–126)
BILIRUBIN TOTAL: 0.9 mg/dL (ref 0.3–1.2)
BUN: 37 mg/dL — ABNORMAL HIGH (ref 6–20)
CO2: 25 mmol/L (ref 22–32)
Calcium: 8 mg/dL — ABNORMAL LOW (ref 8.9–10.3)
Chloride: 103 mmol/L (ref 101–111)
Creatinine, Ser: 0.5 mg/dL (ref 0.44–1.00)
GFR calc Af Amer: >60 mL/min (ref >60)
GFR calc non Af Amer: >60 mL/min (ref >60)
GLUCOSE: 193 mg/dL — AB (ref 65–99)
POTASSIUM: 4 mmol/L (ref 3.5–5.1)
Sodium: 133 mmol/L — ABNORMAL LOW (ref 135–145)
TOTAL PROTEIN: 4.9 g/dL — AB (ref 6.5–8.1)

## 2017-04-12 LAB — GLUCOSE, CAPILLARY
GLUCOSE-CAPILLARY: 121 mg/dL — AB (ref 65–99)
GLUCOSE-CAPILLARY: 262 mg/dL — AB (ref 65–99)
GLUCOSE-CAPILLARY: 309 mg/dL — AB (ref 65–99)
Glucose-Capillary: 161 mg/dL — ABNORMAL HIGH (ref 65–99)

## 2017-04-12 LAB — CBC
HEMATOCRIT: 35.4 % — AB (ref 36.0–46.0)
HEMOGLOBIN: 11.9 g/dL — AB (ref 12.0–15.0)
MCH: 32 pg (ref 26.0–34.0)
MCHC: 33.6 g/dL (ref 30.0–36.0)
MCV: 95.2 fL (ref 78.0–100.0)
Platelets: 178 10*3/uL (ref 150–400)
RBC: 3.72 MIL/uL — ABNORMAL LOW (ref 3.87–5.11)
RDW: 13.7 % (ref 11.5–15.5)
WBC: 14.7 10*3/uL — AB (ref 4.0–10.5)

## 2017-04-12 LAB — TROPONIN I
TROPONIN I: 0.05 ng/mL — AB (ref <0.03)
TROPONIN I: 0.05 ng/mL — AB (ref <0.03)

## 2017-04-12 MED ORDER — METOPROLOL TARTRATE 5 MG/5ML IV SOLN
5.0000 mg | INTRAVENOUS | Status: DC | PRN
  Administered 2017-04-12: 5 mg via INTRAVENOUS
  Filled 2017-04-12: qty 5

## 2017-04-12 MED ORDER — LIVING WELL WITH DIABETES BOOK
Freq: Once | Status: AC
  Administered 2017-04-12: 16:00:00
  Filled 2017-04-12: qty 1

## 2017-04-12 MED ORDER — ATORVASTATIN CALCIUM 20 MG PO TABS
20.0000 mg | ORAL_TABLET | Freq: Every day | ORAL | Status: DC
  Administered 2017-04-12 – 2017-04-14 (×3): 20 mg via ORAL
  Filled 2017-04-12 (×3): qty 1

## 2017-04-12 MED ORDER — DILTIAZEM HCL ER COATED BEADS 120 MG PO CP24
120.0000 mg | ORAL_CAPSULE | Freq: Every day | ORAL | Status: DC
  Administered 2017-04-12: 120 mg via ORAL
  Filled 2017-04-12 (×2): qty 1

## 2017-04-12 MED ORDER — INSULIN STARTER KIT- PEN NEEDLES (ENGLISH)
1.0000 | Freq: Once | Status: AC
  Administered 2017-04-12: 1
  Filled 2017-04-12: qty 1

## 2017-04-12 NOTE — Care Management Note (Addendum)
Case Management Note  Patient Details  Name: [name] MRN: [id number] Date of Birth: 12/16/1945  Subjective/Objective:                  Copd/dka/a.fib with rvr  Action/Plan: Date: April 12, 2017 Velva Harman, BSN, [place], [place] Chart and notes review for patient progress and needs. Will follow for case management and discharge needs. Next review date: [id number]  Expected Discharge Date:  (unknown)               Expected Discharge Plan:  Home/Self Care  In-House Referral:     Discharge planning Services  CM Consult  Post Acute Care Choice:    Choice offered to:     DME Arranged:    DME Agency:     HH Arranged:    HH Agency:     Status of Service:  In process, will continue to follow  If discussed at Long Length of Stay Meetings, dates discussed:    Additional Comments:  Leeroy Cha, RN 04/12/2017, 7:53 AM

## 2017-04-12 NOTE — Progress Notes (Signed)
Inpatient Diabetes Program Recommendations  AACE/ADA: New Consensus Statement on Inpatient Glycemic Control (2015)  Target Ranges:  Prepandial:   less than 140 mg/dL      Peak postprandial:   less than 180 mg/dL (1-2 hours)      Critically ill patients:  140 - 180 mg/dL   Lab Results  Component Value Date   GLUCAP 262 (H) 04/12/2017   HGBA1C 10.6 (H) 04/11/2017    Review of Glycemic ControlResults for NORISSA, BARTEE (MRN [id number]) as of 04/12/2017 14:28  Ref. Range 04/11/2017 18:38 04/11/2017 19:00 04/11/2017 21:21 04/12/2017 00:54 04/12/2017 06:49 04/12/2017 07:43 04/12/2017 12:10  Glucose-Capillary Latest Ref Range: 65 - 99 mg/dL 321 (H)  399 (H)   121 (H) 262 (H)      Diabetes history: Type 2 DM, Diet controlled Outpatient Diabetes medications: none   Current orders for Inpatient glycemic control: Lantus 10 Units, HS correction Novolog, Moderate Correction at meals tid.  Inpatient Diabetes Program Recommendations:     Placed inpatient booklet and insulin starter kit for patient. Consider Novolog 3 units for Meal Coverage and decrease correction down to sensitive scale. Will plan to follow.  Thanks,  Bronson Curb, MSN, RNC-OB Diabetes Coordinator [phone] (8a-5p)

## 2017-04-12 NOTE — Plan of Care (Signed)
  Progressing Education: Knowledge of General Education information will improve 04/12/2017 0532 - Progressing by Naomie Dean, RN Health Behavior/Discharge Planning: Ability to manage health-related needs will improve 04/12/2017 0532 - Progressing by Naomie Dean, RN Clinical Measurements: Ability to maintain clinical measurements within normal limits will improve 04/12/2017 0532 - Progressing by Naomie Dean, RN Will remain free from infection 04/12/2017 0532 - Progressing by Naomie Dean, RN Diagnostic test results will improve 04/12/2017 0532 - Progressing by Naomie Dean, RN Respiratory complications will improve 04/12/2017 0532 - Progressing by Naomie Dean, RN Cardiovascular complication will be avoided -----Pt converted back to NSR. 04/12/2017 0532 - Progressing by Naomie Dean, RN Activity: Risk for activity intolerance will decrease 04/12/2017 0532 - Progressing by Naomie Dean, RN Nutrition: Adequate nutrition will be maintained 04/12/2017 0532 - Progressing by Naomie Dean, RN Coping: Level of anxiety will decrease 04/12/2017 0532 - Progressing by Naomie Dean, RN Elimination: Will not experience complications related to bowel motility 04/12/2017 0532 - Progressing by Naomie Dean, RN Will not experience complications related to urinary retention 04/12/2017 0532 - Progressing by Naomie Dean, RN Pain Managment: General experience of comfort will improve 04/12/2017 0532 - Progressing by Naomie Dean, RN Safety: Ability to remain free from injury will improve 04/12/2017 0532 - Progressing by Naomie Dean, RN Skin Integrity: Risk for impaired skin integrity will decrease 04/12/2017 0532 - Progressing by Naomie Dean, RN

## 2017-04-12 NOTE — Progress Notes (Signed)
Received from ICU.A&Ox4 voices no c/o.

## 2017-04-12 NOTE — Progress Notes (Signed)
PROGRESS NOTE  [name]  RXV:[id number] DOB: [birth year] DOA: 04/11/2017 PCP: [name], MD  Brief Narrative: [name] is a 71 y.o. female with a past medical history of COPD, diet-controlled T2DM, right lung mass highly suspicious for stage IV non-small cell lung cancer who is currently receiving radiation treatment.  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  She presented to the cancer center for routine follow-up, complaining of severe fatigue and intermittently lightheaded. Her blood glucose level was checked and was noted to be greater than 600.  She was sent over to the emergency department for further management.  While evaluating the patient she suddenly developed chest pain.  Heart rate had increased to 150.  EKG was done which revealed atrial fibrillation with RVR.  Patient was given Cardizem.  Cardiology was consulted.  Hemodynamically patient remained stable.  With Cardizem patient converted to sinus rhythm.  Her chest pain resolved. Blood sugar has required insulin for control and HbA1c is 10.6%.   Assessment & Plan: Principal Problem:   Atrial fibrillation with RVR (HCC) Active Problems:   Mass of right lung   SVC (superior vena cava obstruction)   Malignant neoplasm of bronchus of right upper lobe (HCC)   PAF (paroxysmal atrial fibrillation) (HCC)   Hyperglycemia   Acute kidney injury ([place])   Dehydration  Steroid-induced diabetic ketosis and T2DM: Patient has a history of diet-controlled diabetes, only ever requiring metformin but her mother had T1DM. she has never been on insulin in the past.  - Stopped steroids - Gave lantus 10u last night, continuing moderate SSI and HS correction today.  - Consult diabetes coordinator for education. HbA1c of 10.6% suggests hyperglycemia has been an issue prior to steroids. Unfortunately, this is a moving target since steroids are stopped. As her goals of care are not  purely palliative, I suspect insulin will be required for adequate treatment at least in the short term.   Atrial fibrillation with RVR: TSH recently 1.996.  - Continue po diltiazem, currently NSR.  - Cardiology recommending outpatient cardiac monitoring which pt declines due to akin irritation due to radiation. Continue telemetry here - Due to CHA2DS2-VASc score of 4, anticoagulation was recommended, though patient opts to not start this at this time.  - Monitor K, Mg, replacing as needed.  SVC syndrome: Due to lung mass compression, appears to be improving with steroids/XRT.  - Continue palliative XRT while here.  Stage IV NSCLC, poorly-differentiated.  - Follow up with oncology as outpatient.  COPD: Chronic, stable.  - Continue home treatments  ppears to be stable.  Continue home medications.  Acute kidney injury: Due to dehydration from osmotic diuresis and AFib w/RVR. Resolved.   Leukocytosis: Most likely reactive without nidus for infection and improvement without antimicrobials.  - continue to monitor in AM  DVT prophylaxis: Subcutaneous heparin Code Status: Full Family Communication: Husband at bedside Disposition Plan: Transfer to floor, monitor, and anticipate DC home in next 24 hours if stable.  Consultants:   Cardiology, Dr. Debara Pickett  Procedures:   None  Antimicrobials:  None   Subjective: No chest pain, denies dyspnea any worse than baseline, but has not gotten up yet.   Objective: Vitals:   04/12/17 0800 04/12/17 0900 04/12/17 1000 04/12/17 1100  BP: (!) 120/50 (!) 122/40 (!) 124/42 (!) 133/58  Pulse: 93 89 92 94  Resp: (!) 34 (!) 36 (!) 39 18  Temp: 97.6 F (36.4 C)  TempSrc: Oral     SpO2: 94% 94% 93% 93%  Weight:      Height:        Intake/Output Summary (Last 24 hours) at 04/12/2017 1208 Last data filed at 04/12/2017 1045 Gross per 24 hour  Intake 1481.25 ml  Output 450 ml  Net 1031.25 ml   Filed Weights   04/12/17 0240    Weight: 52.4 kg (115 lb 8.3 oz)    Gen: Chronically ill-appearing female in no distress  Pulm: Non-labored tachypnea with supplemental oxygen. Diminished without wheezing or crackles.  CV: Regular rate and rhythm. No murmur, rub, or gallop. No JVD, no pedal edema. GI: Abdomen soft, non-tender, non-distended, with normoactive bowel sounds. No organomegaly or masses felt. Ext: Warm, no deformities Skin: No wounds on exposed skin. Neuro: Alert and oriented. No focal neurological deficits. Psych: Judgement and insight appear normal. Mood & affect appropriate.   Data Reviewed: I have personally reviewed following labs and imaging studies  CBC: Recent Labs  Lab 04/11/17 0852 04/12/17 0054  WBC 18.6* 14.7*  NEUTROABS 17.0*  --   HGB 13.6 11.9*  HCT 42.6 35.4*  MCV 99.1 95.2  PLT 213 350   Basic Metabolic Panel: Recent Labs  Lab 04/11/17 0852 04/11/17 1900 04/12/17 0054  NA 127* 132* 133*  K 5.7 No visable hemolysis* 4.0 4.0  CL  --  99* 103  CO2 24 23 25   GLUCOSE 678* 384* 193*  BUN 44.9* 37* 37*  CREATININE 1.2* 0.62 0.50  CALCIUM 8.7 8.2* 8.0*   GFR: Estimated Creatinine Clearance: 47.7 mL/min (by C-G formula based on SCr of 0.5 mg/dL). Liver Function Tests: Recent Labs  Lab 04/11/17 0852 04/12/17 0054  AST 8 11*  ALT 26 22  ALKPHOS 65 46  BILITOT 0.84 0.9  PROT 5.6* 4.9*  ALBUMIN 3.2* 2.9*   No results for input(s): LIPASE, AMYLASE in the last 168 hours. No results for input(s): AMMONIA in the last 168 hours. Coagulation Profile: No results for input(s): INR, PROTIME in the last 168 hours. Cardiac Enzymes: Recent Labs  Lab 04/11/17 1900 04/12/17 0054 04/12/17 0649  TROPONINI 0.06* 0.05* 0.05*   BNP (last 3 results) No results for input(s): PROBNP in the last 8760 hours. HbA1C: Recent Labs    04/11/17 1900  HGBA1C 10.6*   CBG: Recent Labs  Lab 04/11/17 1319 04/11/17 1438 04/11/17 1838 04/11/17 2121 04/12/17 0743  GLUCAP 419* 271* 321*  399* 121*   Lipid Profile: No results for input(s): CHOL, HDL, LDLCALC, TRIG, CHOLHDL, LDLDIRECT in the last 72 hours. Thyroid Function Tests: No results for input(s): TSH, T4TOTAL, FREET4, T3FREE, THYROIDAB in the last 72 hours. Anemia Panel: No results for input(s): VITAMINB12, FOLATE, FERRITIN, TIBC, IRON, RETICCTPCT in the last 72 hours. Urine analysis:    Component Value Date/Time   COLORURINE YELLOW 04/11/2017 [place] 04/11/2017 1221   LABSPEC 1.031 (H) 04/11/2017 1221   PHURINE 5.0 04/11/2017 1221   GLUCOSEU >=500 (A) 04/11/2017 1221   HGBUR NEGATIVE 04/11/2017 [place] 04/11/2017 1221   KETONESUR 20 (A) 04/11/2017 1221   PROTEINUR NEGATIVE 04/11/2017 1221   NITRITE NEGATIVE 04/11/2017 1221   LEUKOCYTESUR NEGATIVE 04/11/2017 1221   Recent Results (from the past 240 hour(s))  MRSA PCR Screening     Status: None   Collection Time: 04/11/17  6:36 PM  Result Value Ref Range Status   MRSA by PCR NEGATIVE NEGATIVE Final    Comment:  The GeneXpert MRSA Assay (FDA approved for NASAL specimens only), is one component of a comprehensive MRSA colonization surveillance program. It is not intended to diagnose MRSA infection nor to guide or monitor treatment for MRSA infections.       Radiology Studies: Dg Chest 2 View  Result Date: 04/11/2017 CLINICAL DATA:  Hyperglycemia.  History of lung cancer. EXAM: CHEST  2 VIEW COMPARISON:  Chest CT 03/25/2017 FINDINGS: Chronic right upper lobe collapse. Known chronic nodular opacity in the left mid lung. No evidence of pneumonia or edema. No effusion or pneumothorax. Normal heart size. IMPRESSION: Known right upper lobe collapse and cavitary left lung nodule. No acute superimposed finding. Electronically Signed   By: Monte Fantasia M.D.   On: 04/11/2017 11:46    Scheduled Meds: . aspirin EC  81 mg Oral Daily  . atorvastatin  20 mg Oral q1800  . citalopram  20 mg Oral Daily  . diltiazem   120 mg Oral Daily  . heparin  5,000 Units Subcutaneous Q8H  . insulin aspart  0-15 Units Subcutaneous TID WC  . insulin aspart  0-5 Units Subcutaneous QHS  . insulin glargine  10 Units Subcutaneous QHS  . mouth rinse  15 mL Mouth Rinse BID   Continuous Infusions:   LOS: 1 day   Time spent: 25 minutes.  Vance Gather, MD [facility] Pager [phone]  If 7PM-7AM, please contact night-coverage [email or web address] Password [email or web address] 04/12/2017, 12:08 PM

## 2017-04-12 NOTE — Progress Notes (Signed)
Called ICU for bed 1238, spoke with RN Drue Dun, okay to have radiation treatment, can travel via wheelchair, thanked RN, 8:14 AM

## 2017-04-12 NOTE — Progress Notes (Signed)
Rapid Response Event Note  Overview: RRT called to room 1414 to assess pt in A-fib, as noted by the 12 lead EKG.   Initial Focused Assessment: HR irregular, pulse palpitated to be irregular as well. Per EKG monitor pt HR ranging from 100s- 140s bpm sustained. BP 117/69 (83). Per RN pt was on Cardizem gtt due to being in A-fib RVR and was transitioned to PO Cardizem on 11/16 at 0500.   Interventions: RN notified Jeannette Corpus, NP of completion of 12 lead EKG. RRT notified NP of pt current HR sustaining in the 130s and spiking up to 170-180 when pt coughed. RRT made NP aware of rhythm appearing to be a-fib/a-flutter. NP ordered 5mg  IV metoprolol be given every 5 minutes up to two doses and to notify her if HR remained greater than 130 bpm following the two doses. 5mg  IV metoprolol given x1 dose. See VS charted in flow sheet.   Plan of Care (if not transferred): NP made aware that pt is currently on telemetry unit. At time of RRT departure, pt HR sustaining in the 90s, not spiking greater than 110 bpm. Rhythm still a-fib/ a-flutter with HR trying to convert to NSR following administration of IV metoprolol. Per NP, RRT advised RN to continue to monitor pt HR. If HR sustains above 130 bpm RN to give one more dose of PRN IV 5 mg Metoprolol. If HR does not decreased below 130 bpm following IV metoprolol administration, RN to contact NP for further instructions. RN made aware of plan of care by RRT, and RN confident in continuing care of patient at this time. RN to contact RRT, if needed.   [name]

## 2017-04-12 NOTE — Progress Notes (Signed)
The order for simvastatin(Zocor) was changed to an equivalent dose of atorvastatin(Lipitor) due to the potential drug interaction with Cardizem  When taken in combination with medications that inhibit its metabolism, simvastatin can accumulate which increases the risk of liver toxicity, myopathy, or rhabdomyolysis.  Simvastatin dose should not exceed 10mg /day in patients taking verapamil, diltiazem, fibrates, or niacin >or= 1g/day.   Simvastatin dose should not exceed 20mg /day in patients taking amlodipine, ranolazine or amiodarone.   Please consider this potential interaction at discharge.  [name] 04/12/2017 4:09 AM

## 2017-04-12 NOTE — Progress Notes (Signed)
Spoke with patient regarding diabetes.  She is familiar with diabetes management from her mother having Type 1 diabetes. We discussed survival skills including hypoglycemia, hyperglycemia, high blood sugars from steroids, monitoring, and the importance of follow-up.  Patient needs glucose meter for checking CBG's.  Demonstrated use of insulin pen to patient and allowed her to return demonstration including putting on needle, 2 unit prime, administration, cleaning the site, and holding in place for 6-10 seconds.  Patient has Living well with diabetes booklet and insulin starter kit at bedside.  She was appreciative of information.    Thanks, Adah Perl, RN, BC-ADM Inpatient Diabetes Coordinator Pager [phone] (8a-5p)

## 2017-04-12 NOTE — Progress Notes (Signed)
Progress Note  Patient Name: [name] Date of Encounter: 04/12/2017  Primary Cardiologist: New - Dr. Debara Pickett  Subjective   Breathing improved but not at baseline. Denies any chest pain or palpitations. Was unaware of her arrhythmia yesterday.   Inpatient Medications    Scheduled Meds: . aspirin EC  81 mg Oral Daily  . atorvastatin  20 mg Oral q1800  . citalopram  20 mg Oral Daily  . diltiazem  120 mg Oral Daily  . heparin  5,000 Units Subcutaneous Q8H  . insulin aspart  0-15 Units Subcutaneous TID WC  . insulin aspart  0-5 Units Subcutaneous QHS  . insulin glargine  10 Units Subcutaneous QHS  . mouth rinse  15 mL Mouth Rinse BID   Continuous Infusions:  PRN Meds: acetaminophen **OR** acetaminophen, ipratropium-albuterol, ondansetron **OR** ondansetron (ZOFRAN) IV   Vital Signs    Vitals:   04/12/17 0412 04/12/17 0500 04/12/17 0507 04/12/17 0600  BP:  (!) 128/48 (!) 128/48 (!) 126/51  Pulse:  72  73  Resp:  (!) 23  20  Temp: (!) 97.3 F (36.3 C)     TempSrc: Oral     SpO2:  93%  92%  Weight:      Height:        Intake/Output Summary (Last 24 hours) at 04/12/2017 0734 Last data filed at 04/12/2017 0500 Gross per 24 hour  Intake 1001.25 ml  Output 450 ml  Net 551.25 ml   Filed Weights   04/12/17 0240  Weight: 115 lb 8.3 oz (52.4 kg)    Telemetry    Atrial fibrillation yesterday afternoon and evening with conversion to NSR overnight with post-conversion pauses noted up to 1.7 seconds. Now maintaining NSR with HR in the 70's - 90's.  - Personally Reviewed  ECG    Atrial fibrillation with RVR, HR 161 - Personally Reviewed  Physical Exam   General: Well developed, well nourished Caucasian female appearing in no acute distress. Head: Normocephalic, atraumatic.  Neck: Supple without bruits, JVD not elevated. Lungs:  Resp regular and unlabored, inspiratory wheezing noted along upper lung fields bilaterally. Heart: RRR, S1, S2, no S3, S4, or murmur; no  rub. Abdomen: Soft, non-tender, non-distended with normoactive bowel sounds. No hepatomegaly. No rebound/guarding. No obvious abdominal masses. Extremities: No clubbing, cyanosis, or edema. Distal pedal pulses are 2+ bilaterally. Neuro: Alert and oriented X 3. Moves all extremities spontaneously. Psych: Normal affect.  Labs    Chemistry Recent Labs  Lab 04/11/17 0852 04/11/17 1900 04/12/17 0054  NA 127* 132* 133*  K 5.7 No visable hemolysis* 4.0 4.0  CL  --  99* 103  CO2 24 23 25   GLUCOSE 678* 384* 193*  BUN 44.9* 37* 37*  CREATININE 1.2* 0.62 0.50  CALCIUM 8.7 8.2* 8.0*  PROT 5.6*  --  4.9*  ALBUMIN 3.2*  --  2.9*  AST 8  --  11*  ALT 26  --  22  ALKPHOS 65  --  46  BILITOT 0.84  --  0.9  GFRNONAA  --  >60 >60  GFRAA  --  >60 >60  ANIONGAP 11 10 5      Hematology Recent Labs  Lab 04/11/17 0852 04/12/17 0054  WBC 18.6* 14.7*  RBC 4.30 3.72*  HGB 13.6 11.9*  HCT 42.6 35.4*  MCV 99.1 95.2  MCH 31.6 32.0  MCHC 31.9 33.6  RDW 13.8 13.7  PLT 213 178    Cardiac Enzymes Recent Labs  Lab 04/11/17 1900 04/12/17  0054  TROPONINI 0.06* 0.05*   No results for input(s): TROPIPOC in the last 168 hours.   BNPNo results for input(s): BNP, PROBNP in the last 168 hours.   DDimer No results for input(s): DDIMER in the last 168 hours.   Radiology    Dg Chest 2 View  Result Date: 04/11/2017 CLINICAL DATA:  Hyperglycemia.  History of lung cancer. EXAM: CHEST  2 VIEW COMPARISON:  Chest CT 03/25/2017 FINDINGS: Chronic right upper lobe collapse. Known chronic nodular opacity in the left mid lung. No evidence of pneumonia or edema. No effusion or pneumothorax. Normal heart size. IMPRESSION: Known right upper lobe collapse and cavitary left lung nodule. No acute superimposed finding. Electronically Signed   By: Monte Fantasia M.D.   On: 04/11/2017 11:46    Cardiac Studies   Echocardiogram: 01/2017 Study Conclusions  - Left ventricle: The cavity size was normal. Wall  thickness was   normal. Systolic function was vigorous. The estimated ejection   fraction was in the range of 65% to 70%. Wall motion was normal;   there were no regional wall motion abnormalities. Doppler   parameters are consistent with abnormal left ventricular   relaxation (grade 1 diastolic dysfunction). - Pulmonary arteries: PA peak pressure: 32 mm Hg (S).  Patient Profile     71 y.o. female w/ PMH of HTN, HLD, Type 2 DM, and lung cancer who presented to [facility] ED on 04/11/2017 for hyperglycemia (BS 678) and weakness, found to be in atrial fibrillation with RVR. Cardiology consulted for further assistance in managing her arrhythmia.   Assessment & Plan    1. New-onset atrial fibrillation - presented for evaluation of weakness and found to have significant hyperglycemia and to be in atrial fibrillation with RVR. She was started on IV Cardizem and experienced conversion back to NSR. Had recurrent PAF yesterday evening but converted back to NSR overnight with post-conversion pauses up to 1.7 seconds. Maintaining NSR since with HR in the 70's - 90's. - TSH at 1.996 in 02/2017. K+ 4.0. Troponin values flat at 0.05 and 0.06 likely secondary to demand ischemia in the setting of her elevated HR. Will check Mg. Echo from 01/2017 showed a preserved EF of 65-70% with Grade 1 DD.  - This patients CHA2DS2-VASc Score and unadjusted Ischemic Stroke Rate (% per year) is equal to 4.8 % stroke rate/year from a score of 4 (Female, HTN, DM, Age). Due to her brief episode, she has not been started on long-term anticoagulation. Consider event monitor in the future to further assess AF burden. Patient does not wish to have a monitor at the time of discharge due to placing wound cream along her chest following radiation treatments.   2. HTN - BP variable at 80/27 - 137/81 within the past 24 hours (episodes of hypotension with IV Cardizem).  - BP now well-controlled at 126/51. Continue Cardizem CD 120mg  daily.   3.  Type 2 DM - Hgb A1c elevated to 10.6. Further management per admitting team.   4. Lung Cancer - currently undergoing radiation. Per Oncology.    For questions or updates, please contact [place] Please consult [email or web address] for contact info under Cardiology/STEMI.   Signed, Erma Heritage , PA-C 7:34 AM 04/12/2017 Pager: [phone]

## 2017-04-13 ENCOUNTER — Other Ambulatory Visit: Payer: Self-pay

## 2017-04-13 LAB — MAGNESIUM: Magnesium: 1.8 mg/dL (ref 1.7–2.4)

## 2017-04-13 LAB — CBC
HEMATOCRIT: 38.3 % (ref 36.0–46.0)
HEMOGLOBIN: 12.6 g/dL (ref 12.0–15.0)
MCH: 31.4 pg (ref 26.0–34.0)
MCHC: 32.9 g/dL (ref 30.0–36.0)
MCV: 95.5 fL (ref 78.0–100.0)
Platelets: 176 10*3/uL (ref 150–400)
RBC: 4.01 MIL/uL (ref 3.87–5.11)
RDW: 13.6 % (ref 11.5–15.5)
WBC: 8.3 10*3/uL (ref 4.0–10.5)

## 2017-04-13 LAB — BASIC METABOLIC PANEL
ANION GAP: 4 — AB (ref 5–15)
BUN: 36 mg/dL — AB (ref 6–20)
CHLORIDE: 102 mmol/L (ref 101–111)
CO2: 27 mmol/L (ref 22–32)
Calcium: 7.9 mg/dL — ABNORMAL LOW (ref 8.9–10.3)
Creatinine, Ser: 0.45 mg/dL (ref 0.44–1.00)
GFR calc Af Amer: >60 mL/min (ref >60)
GFR calc non Af Amer: >60 mL/min (ref >60)
Glucose, Bld: 175 mg/dL — ABNORMAL HIGH (ref 65–99)
POTASSIUM: 3.8 mmol/L (ref 3.5–5.1)
Sodium: 133 mmol/L — ABNORMAL LOW (ref 135–145)

## 2017-04-13 LAB — GLUCOSE, CAPILLARY
GLUCOSE-CAPILLARY: 177 mg/dL — AB (ref 65–99)
Glucose-Capillary: 160 mg/dL — ABNORMAL HIGH (ref 65–99)
Glucose-Capillary: 258 mg/dL — ABNORMAL HIGH (ref 65–99)
Glucose-Capillary: 262 mg/dL — ABNORMAL HIGH (ref 65–99)

## 2017-04-13 MED ORDER — DILTIAZEM HCL 60 MG PO TABS
60.0000 mg | ORAL_TABLET | Freq: Four times a day (QID) | ORAL | Status: DC
  Administered 2017-04-13: 60 mg via ORAL
  Filled 2017-04-13: qty 1

## 2017-04-13 MED ORDER — APIXABAN 5 MG PO TABS
5.0000 mg | ORAL_TABLET | Freq: Two times a day (BID) | ORAL | Status: DC
  Administered 2017-04-13 – 2017-04-14 (×2): 5 mg via ORAL
  Filled 2017-04-13 (×2): qty 1

## 2017-04-13 MED ORDER — DILTIAZEM HCL 90 MG PO TABS
90.0000 mg | ORAL_TABLET | Freq: Four times a day (QID) | ORAL | Status: DC
  Administered 2017-04-13 – 2017-04-14 (×4): 90 mg via ORAL
  Filled 2017-04-13 (×4): qty 1

## 2017-04-13 NOTE — Progress Notes (Signed)
Pt went to restroom and HR went to 150s-170s, EKG performed, vitals stable, paged MD and rapid response nurse. Was given order to give Lopressor. Pts HR came down to 90s-110s, will continue to monitor.

## 2017-04-13 NOTE — Progress Notes (Signed)
Patient HR up to 170s this AM.  Patient had no S/S of elevated HR.  BP stable at 110/61.  Dr. Florene Glen on floor to see patient.  New orders placed.  Will carry out new orders and continue to monitor patient.  Patient's HR currently 115, resting in bed comfortably.

## 2017-04-13 NOTE — Progress Notes (Signed)
ANTICOAGULATION CONSULT NOTE - Initial Consult  Pharmacy Consult for Eliquis Indication: atrial fibrillation  Allergies  Allergen Reactions  . Penicillins Hives    Has patient had a PCN reaction causing immediate rash, facial/tongue/throat swelling, SOB or lightheadedness with hypotension: yes Has patient had a PCN reaction causing severe rash involving mucus membranes or skin necrosis: no Has patient had a PCN reaction that required hospitalization: yes Has patient had a PCN reaction occurring within the last 10 years: no If all of the above answers are "NO", then may proceed with Cephalosporin use.   . Tiotropium Bromide Monohydrate Other (See Comments)    EYE PAIN KIDNEY FUNCTION SLOWED DOWN    Patient Measurements: Height: 5' (152.4 cm) Weight: 115 lb 8.3 oz (52.4 kg) IBW/kg (Calculated) : 45.5  Vital Signs: Temp: 97.5 F (36.4 C) (11/17 1438) Temp Source: Axillary (11/17 1438) BP: 112/47 (11/17 1438) Pulse Rate: 95 (11/17 1438)  Labs: Recent Labs    04/11/17 0852  04/11/17 1900 04/12/17 0054 04/12/17 0649 04/13/17 0619  HGB 13.6  --   --  11.9*  --  12.6  HCT 42.6  --   --  35.4*  --  38.3  PLT 213  --   --  178  --  176  CREATININE  --    < > 0.62 0.50  --  0.45  TROPONINI  --   --  0.06* 0.05* 0.05*  --    < > = values in this interval not displayed.    Estimated Creatinine Clearance: 47.7 mL/min (by C-G formula based on SCr of 0.45 mg/dL).   Medical History: Past Medical History:  Diagnosis Date  . Arthritis   . Asthma   . COPD (chronic obstructive pulmonary disease) ([place])   . Depression   . Diabetes mellitus    diet controlled  . History of hiatal hernia   . Hyperlipemia   . Hypertension   . Pneumonia     Medications:  Scheduled:  . aspirin EC  81 mg Oral Daily  . atorvastatin  20 mg Oral q1800  . citalopram  20 mg Oral Daily  . diltiazem  90 mg Oral Q6H  . insulin aspart  0-15 Units Subcutaneous TID WC  . insulin aspart  0-5 Units  Subcutaneous QHS  . insulin glargine  10 Units Subcutaneous QHS  . mouth rinse  15 mL Mouth Rinse BID   Infusions:    Assessment: 26 yoF with new Afib.  Pharmacy is consulted to dose Eliquis for Afib. She has been on Heparin 5000 units SQ for VTE prophylaxis.  Last dose was given 11/17 at 1455  SCr 0.45, CrCl ~ 47 ml/min CBC: Hgb 12.6, Plt 176 Drug-drug interactions: Diltiazem (moderate CYP3A4 inhibitor) may increase serum concentrations, but is not associated with increased risk of major bleeding.    Goal of Therapy:  Monitor platelets by anticoagulation protocol: Yes   Plan:   Apixaban 5 mg PO BID  Pharmacy to provide education prior to discharge.  Pharmacy to s/o note writing, but will peripherally follow renal function, CBC  Gretta Arab PharmD, BCPS Pager [phone] 04/13/2017 4:28 PM

## 2017-04-13 NOTE — Progress Notes (Addendum)
PROGRESS NOTE    [name]  ZWC:[id number] DOB: Aug 03, 1945 DOA: 04/11/2017 PCP: [name], MD    Brief Narrative:  SWEETIE GIEBLER [name] 71 y.o.femalewith Fitz Matsuo past medical history of COPD, diet-controlled T2DM, right lung mass highly suspicious for stage IV non-small cell lung cancer who is currently receiving radiation treatment. She was hospitalized [facility] couple of weeks ago for SVC syndrome and was started on high-dose steroids. She presented to the cancer center for routine follow-up, complaining of severe fatigue and intermittently lightheaded. Her blood glucose level was checked and was noted to be greater than 600. She was sent over to the emergency department for further management.  While evaluating the patient she suddenly developed chest pain. Heart rate had increased to 150. EKG was done which revealed atrial fibrillation with RVR. Patient was given Cardizem. Cardiology was consulted. Hemodynamically patient remained stable. With Cardizem patient converted to sinus rhythm. Her chest pain resolved. Blood sugar has required insulin for control and HbA1c is 10.6%.   Assessment & Plan:   Principal Problem:   Atrial fibrillation with RVR (HCC) Active Problems:   Mass of right lung   SVC (superior vena cava obstruction)   Malignant neoplasm of bronchus of right upper lobe (HCC)   PAF (paroxysmal atrial fibrillation) (HCC)   Hyperglycemia   Acute kidney injury ([place])   Dehydration   Steroid-induced diabetic ketosis and T2DM: Patient has [name] history of diet-controlled diabetes, only ever requiring metformin but her mother had T1DM. she has never been on insulin in the past.  - Stopped steroids - Gave lantus 10u last night, continuing moderate SSI and HS correction today.  - Consult diabetes coordinator for education. HbA1c of 10.6% suggests hyperglycemia has been an issue prior to steroids. Unfortunately, this is [name] moving target since steroids are stopped. As her goals of care  are not purely palliative, I suspect insulin will be required for adequate treatment at least in the short term.   Atrial fibrillation with RVR: TSH recently 1.996.  Has had HR up to 170's overnight.  Given metop IV.  This morning with persistent intermittent RVR.  Will increase dilt dose, give q6 to titrate.   - Continue po diltiazem, currently NSR.  - Cardiology recommending outpatient cardiac monitoring which pt declines due to akin irritation due to radiation. Continue telemetry here - Due to CHA2DS2-VASc score of 4, anticoagulation was recommended, though patient opts to not start this at this time (looks like major concern was cost - discussed with pharmacy and would be 45$ for eliquis, will discuss with pt) - > pt willing to start, will d/c ASA which looks like it's for primary prevention. - Monitor K, Mg, replacing as needed.  SVC syndrome:Due to lung mass compression, appears to be improving with steroids/XRT.  - Continue palliative XRT while here.  Stage IV NSCLC, poorly-differentiated.  - Follow up with oncology as outpatient.  COPD: Chronic, stable.  - Continue home treatments - ppears to be stable. Continue home medications.  Acute kidney injury: Due to dehydration from osmotic diuresis and AFib w/RVR. Resolved.   Leukocytosis: Most likely reactive without nidus for infection and improvement without antimicrobials.  - continue to monitor in AM  DVT prophylaxis: heparin Code Status: full  Family Communication: husband in room Disposition Plan: pending rate control   Consultants:   cardiology  Procedures: (Don't include imaging studies which can be auto populated. Include things that cannot be auto populated i.e. Echo, Carotid and venous dopplers, Foley, Bipap, HD,  tubes/drains, wound vac, central lines etc)  none  Antimicrobials: (specify start and planned stop date. Auto populated tables are space occupying and do not give end dates)  none     Subjective: Anxious.  Wants to go home.  No CP.  Can't tell when heart is racing.   Objective: Vitals:   04/12/17 2335 04/12/17 2340 04/13/17 0456 04/13/17 0905  BP: 94/67 104/76 (!) 147/50 110/61  Pulse: 94 97 96 (!) 155  Resp: 20 (!) 26 18 (!) 24  Temp:   98.1 F (36.7 C) 97.9 F (36.6 C)  TempSrc:   Oral Oral  SpO2:   98% 97%  Weight:      Height:        Intake/Output Summary (Last 24 hours) at 04/13/2017 0926 Last data filed at 04/12/2017 1900 Gross per 24 hour  Intake 850 ml  Output 200 ml  Net 650 ml   Filed Weights   04/12/17 0240  Weight: 52.4 kg (115 lb 8.3 oz)    Examination:  General exam: Appears calm and comfortable  Respiratory system: Clear to auscultation. Respiratory effort normal. Cardiovascular system: S1 & S2 heard, RRR, tachy. No JVD, murmurs, rubs, gallops or clicks. No pedal edema. Gastrointestinal system: Abdomen is nondistended, soft and nontender. No organomegaly or masses felt. Normal bowel sounds heard. Central nervous system: Alert and oriented. No focal neurological deficits. Extremities: Symmetric 5 x 5 power. Skin: No rashes, lesions or ulcers Psychiatry: Anxious, wants to go home.     Data Reviewed: I have personally reviewed following labs and imaging studies  CBC: Recent Labs  Lab 04/11/17 0852 04/12/17 0054 04/13/17 0619  WBC 18.6* 14.7* 8.3  NEUTROABS 17.0*  --   --   HGB 13.6 11.9* 12.6  HCT 42.6 35.4* 38.3  MCV 99.1 95.2 95.5  PLT 213 178 592   Basic Metabolic Panel: Recent Labs  Lab 04/11/17 0852 04/11/17 1900 04/12/17 0054 04/13/17 0619  NA 127* 132* 133* 133*  K 5.7 No visable hemolysis* 4.0 4.0 3.8  CL  --  99* 103 102  CO2 24 23 25 27   GLUCOSE 678* 384* 193* 175*  BUN 44.9* 37* 37* 36*  CREATININE 1.2* 0.62 0.50 0.45  CALCIUM 8.7 8.2* 8.0* 7.9*  MG  --   --   --  1.8   GFR: Estimated Creatinine Clearance: 47.7 mL/min (by C-G formula based on SCr of 0.45 mg/dL). Liver Function Tests: Recent  Labs  Lab 04/11/17 0852 04/12/17 0054  AST 8 11*  ALT 26 22  ALKPHOS 65 46  BILITOT 0.84 0.9  PROT 5.6* 4.9*  ALBUMIN 3.2* 2.9*   No results for input(s): LIPASE, AMYLASE in the last 168 hours. No results for input(s): AMMONIA in the last 168 hours. Coagulation Profile: No results for input(s): INR, PROTIME in the last 168 hours. Cardiac Enzymes: Recent Labs  Lab 04/11/17 1900 04/12/17 0054 04/12/17 0649  TROPONINI 0.06* 0.05* 0.05*   BNP (last 3 results) No results for input(s): PROBNP in the last 8760 hours. HbA1C: Recent Labs    04/11/17 1900  HGBA1C 10.6*   CBG: Recent Labs  Lab 04/12/17 0743 04/12/17 1210 04/12/17 1716 04/12/17 2105 04/13/17 0744  GLUCAP 121* 262* 309* 161* 160*   Lipid Profile: No results for input(s): CHOL, HDL, LDLCALC, TRIG, CHOLHDL, LDLDIRECT in the last 72 hours. Thyroid Function Tests: No results for input(s): TSH, T4TOTAL, FREET4, T3FREE, THYROIDAB in the last 72 hours. Anemia Panel: No results for input(s): VITAMINB12, FOLATE, FERRITIN,  TIBC, IRON, RETICCTPCT in the last 72 hours. Sepsis Labs: No results for input(s): PROCALCITON, LATICACIDVEN in the last 168 hours.  Recent Results (from the past 240 hour(s))  MRSA PCR Screening     Status: None   Collection Time: 04/11/17  6:36 PM  Result Value Ref Range Status   MRSA by PCR NEGATIVE NEGATIVE Final    Comment:        The GeneXpert MRSA Assay (FDA approved for NASAL specimens only), is one component of Rajvi Armentor comprehensive MRSA colonization surveillance program. It is not intended to diagnose MRSA infection nor to guide or monitor treatment for MRSA infections.          Radiology Studies: Dg Chest 2 View  Result Date: 04/11/2017 CLINICAL DATA:  Hyperglycemia.  History of lung cancer. EXAM: CHEST  2 VIEW COMPARISON:  Chest CT 03/25/2017 FINDINGS: Chronic right upper lobe collapse. Known chronic nodular opacity in the left mid lung. No evidence of pneumonia or edema.  No effusion or pneumothorax. Normal heart size. IMPRESSION: Known right upper lobe collapse and cavitary left lung nodule. No acute superimposed finding. Electronically Signed   By: Monte Fantasia M.D.   On: 04/11/2017 11:46        Scheduled Meds: . aspirin EC  81 mg Oral Daily  . atorvastatin  20 mg Oral q1800  . citalopram  20 mg Oral Daily  . diltiazem  60 mg Oral Q6H  . heparin  5,000 Units Subcutaneous Q8H  . insulin aspart  0-15 Units Subcutaneous TID WC  . insulin aspart  0-5 Units Subcutaneous QHS  . insulin glargine  10 Units Subcutaneous QHS  . mouth rinse  15 mL Mouth Rinse BID   Continuous Infusions:   LOS: 2 days    Time spent: over 30 min    Fayrene Helper, MD [facility] Pager [phone]  If 7PM-7AM, please contact night-coverage [email or web address] Password [email or web address] 04/13/2017, 9:26 AM

## 2017-04-14 ENCOUNTER — Ambulatory Visit
Admission: RE | Admit: 2017-04-14 | Discharge: 2017-04-14 | Disposition: A | Discharge status: Home / Self Care | Payer: Medicare Other | Source: Ambulatory Visit | Attending: Radiation Oncology | Admitting: Radiation Oncology

## 2017-04-14 ENCOUNTER — Inpatient Hospital Stay (HOSPITAL_COMMUNITY): Payer: Medicare Other

## 2017-04-14 DIAGNOSIS — I871 Compression of vein: Secondary | ICD-10-CM

## 2017-04-14 DIAGNOSIS — R918 Other nonspecific abnormal finding of lung field: Secondary | ICD-10-CM

## 2017-04-14 DIAGNOSIS — M7989 Other specified soft tissue disorders: Secondary | ICD-10-CM

## 2017-04-14 DIAGNOSIS — R739 Hyperglycemia, unspecified: Secondary | ICD-10-CM

## 2017-04-14 LAB — BASIC METABOLIC PANEL
Anion gap: 6 (ref 5–15)
BUN: 28 mg/dL — AB (ref 6–20)
CALCIUM: 8.1 mg/dL — AB (ref 8.9–10.3)
CO2: 26 mmol/L (ref 22–32)
CREATININE: 0.4 mg/dL — AB (ref 0.44–1.00)
Chloride: 100 mmol/L — ABNORMAL LOW (ref 101–111)
GFR calc non Af Amer: >60 mL/min (ref >60)
GLUCOSE: 197 mg/dL — AB (ref 65–99)
Potassium: 4 mmol/L (ref 3.5–5.1)
Sodium: 132 mmol/L — ABNORMAL LOW (ref 135–145)

## 2017-04-14 LAB — CBC
HEMATOCRIT: 37.8 % (ref 36.0–46.0)
Hemoglobin: 12.6 g/dL (ref 12.0–15.0)
MCH: 32 pg (ref 26.0–34.0)
MCHC: 33.3 g/dL (ref 30.0–36.0)
MCV: 95.9 fL (ref 78.0–100.0)
Platelets: 183 10*3/uL (ref 150–400)
RBC: 3.94 MIL/uL (ref 3.87–5.11)
RDW: 13.5 % (ref 11.5–15.5)
WBC: 9.1 10*3/uL (ref 4.0–10.5)

## 2017-04-14 LAB — GLUCOSE, CAPILLARY
Glucose-Capillary: 186 mg/dL — ABNORMAL HIGH (ref 65–99)
Glucose-Capillary: 218 mg/dL — ABNORMAL HIGH (ref 65–99)
Glucose-Capillary: 167 mg/dL — ABNORMAL HIGH (ref 65–99)

## 2017-04-14 MED ORDER — APIXABAN 5 MG PO TABS
10.0000 mg | ORAL_TABLET | Freq: Two times a day (BID) | ORAL | Status: DC
  Administered 2017-04-14: 10 mg via ORAL
  Filled 2017-04-14: qty 2

## 2017-04-14 MED ORDER — LEVALBUTEROL HCL 0.63 MG/3ML IN NEBU
0.6300 mg | INHALATION_SOLUTION | Freq: Four times a day (QID) | RESPIRATORY_TRACT | Status: DC | PRN
  Administered 2017-04-14: 0.63 mg via RESPIRATORY_TRACT

## 2017-04-14 MED ORDER — LEVALBUTEROL HCL 0.63 MG/3ML IN NEBU
INHALATION_SOLUTION | RESPIRATORY_TRACT | Status: AC
  Filled 2017-04-14: qty 3

## 2017-04-14 MED ORDER — INSULIN ASPART 100 UNIT/ML ~~LOC~~ SOLN: 0.0000 [IU] | Freq: Every day | SUBCUTANEOUS | Status: DC

## 2017-04-14 MED ORDER — BLOOD GLUCOSE MONITOR KIT: PACK | 0 refills | Status: AC

## 2017-04-14 MED ORDER — DILTIAZEM HCL ER COATED BEADS 180 MG PO CP24
360.0000 mg | ORAL_CAPSULE | Freq: Every day | ORAL | Status: DC
  Administered 2017-04-14: 360 mg via ORAL
  Filled 2017-04-14: qty 2

## 2017-04-14 MED ORDER — LEVALBUTEROL HCL 0.63 MG/3ML IN NEBU: 0.6300 mg | INHALATION_SOLUTION | Freq: Two times a day (BID) | RESPIRATORY_TRACT | Status: DC

## 2017-04-14 MED ORDER — APIXABAN 5 MG PO TABS: ORAL_TABLET | ORAL | 0 refills | Status: DC

## 2017-04-14 MED ORDER — INSULIN LISPRO 100 UNIT/ML ~~LOC~~ SOLN: 3.0000 [IU] | Freq: Three times a day (TID) | SUBCUTANEOUS | 11 refills | Status: DC

## 2017-04-14 MED ORDER — INSULIN GLARGINE 100 UNIT/ML ~~LOC~~ SOLN: 10.0000 [IU] | Freq: Every day | SUBCUTANEOUS | 11 refills | Status: DC

## 2017-04-14 MED ORDER — INSULIN ASPART 100 UNIT/ML ~~LOC~~ SOLN
0.0000 [IU] | Freq: Three times a day (TID) | SUBCUTANEOUS | Status: DC
  Administered 2017-04-14: 2 [IU] via SUBCUTANEOUS
  Administered 2017-04-14: 3 [IU] via SUBCUTANEOUS

## 2017-04-14 MED ORDER — INSULIN ASPART 100 UNIT/ML ~~LOC~~ SOLN
3.0000 [IU] | Freq: Three times a day (TID) | SUBCUTANEOUS | Status: DC
  Administered 2017-04-14 (×2): 3 [IU] via SUBCUTANEOUS

## 2017-04-14 MED ORDER — DILTIAZEM HCL ER COATED BEADS 360 MG PO CP24: 360.0000 mg | ORAL_CAPSULE | Freq: Every day | ORAL | 0 refills | Status: DC

## 2017-04-14 NOTE — Progress Notes (Signed)
VASCULAR LAB PRELIMINARY  PRELIMINARY  PRELIMINARY  PRELIMINARY  Bilateral upper extremity venous duplex completed.    Preliminary report:  There is no occlusive DVT or SVT noted in the bilateral upper extremities.  There is some possible soft thrombus vs. Significant sluggish flow noted in the right axillary at the axilla.  There is sluggish flow throughout the bilateral jugular, subclavian, and axillary veins.  Yarnell Arvidson, RVT 04/14/2017, 3:21 PM

## 2017-04-14 NOTE — Care Management Note (Signed)
Case Management Note  Patient Details  Name: AERICA RINCON MRN: [id number] Date of Birth: May 03, 1946  Subjective/Objective:    Atrial fibrillation with RVR, diabetic ketosis                Action/Plan: Discharge Planning: NCM spoke to pt and offered choice for HH/list provided. Pt has RW, wheelchair, oxygen [facility]), cane, neb machine and bedside commode at home. Husband at home to assist with care. Orders sent to [facility] for [facility]. Pt was provided with Eliquis 30 day free trial card by pharmacy.    PCP [name]   Expected Discharge Date:  04/14/17               Expected Discharge Plan:  Home/Self Care  In-House Referral:  NA  Discharge planning Services  CM Consult  Post Acute Care Choice:  Home Health Choice offered to:  Patient  DME Arranged:  N/A DME Agency:  NA  HH Arranged:  PT, RN, Nurse's Aide, Social Work [employer] Agency:  [place]  Status of Service:  Completed, signed off  If discussed at [employer], dates discussed:    Additional Comments:  Erenest Rasher, RN 04/14/2017, 6:12 PM

## 2017-04-14 NOTE — Progress Notes (Signed)
Patient converted to NSR at 1945 last night 04/13/17. HR currently in 80s, NSR. Will continue to monitor.

## 2017-04-14 NOTE — Evaluation (Signed)
Physical Therapy Evaluation Patient Details Name: [name] MRN: [id number] DOB: [birth year] Today's Date: 04/14/2017   History of Present Illness  [name] a 71 y.o.femalewith a past medical history of COPD,diet-controlled T2DM,right lung mass highly suspicious for stage IV non-small cell lung cancer who is currently receiving radiation treatment. 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 She presented to the cancer center for routine follow-up, complaining of severe fatigue and intermittently lightheaded.Her blood glucose level was checked and was noted to be greater than 600.; pt sent to ED and while being examined pt went t into afib with RVR  Clinical Impression  Pt is weaker than her baseline; 2-3/4 DOE with amb x 50' with RW; advised pt to utilize rollator at home and energy conservation techniques; would benefit from HHPT for further strengthening/balance and energy conservation education to decr risk of falls and readmission  (pt has also had 3 hospital admissions in 6 mos)    Follow Up Recommendations Home health PT;Supervision - Intermittent    Equipment Recommendations  None recommended by PT    Recommendations for Other Services       Precautions / Restrictions Precautions Precautions: Fall Restrictions Weight Bearing Restrictions: No      Mobility  Bed Mobility Overal bed mobility: Modified Independent                Transfers Overall transfer level: Needs assistance Equipment used: Rolling walker (2 wheeled) Transfers: Sit to/from Stand Sit to Stand: Min guard         General transfer comment: cuesf or hand placement and safety  Ambulation/Gait Ambulation/Gait assistance: Min guard;Supervision Ambulation Distance (Feet): 50 Feet Assistive device: Rolling walker (2 wheeled) Gait Pattern/deviations: Step-through pattern;Decreased stride length;Narrow base of support;Trunk  flexed Gait velocity: decr   General Gait Details: cues for breathing, posture, rest and self awareness; SpO2 94% or greater on 2L [place] throughout; pt fatigues quickly, standing rest after 30'  Stairs            Wheelchair Mobility    Modified Rankin (Stroke Patients Only)       Balance Overall balance assessment: Needs assistance   Sitting balance-Leahy Scale: Good       Standing balance-Leahy Scale: Fair                 High Level Balance Comments: requires UE support for dynamic balance/challenges             Pertinent Vitals/Pain Pain Assessment: No/denies pain    Home Living Family/patient expects to be discharged to:: Private residence Living Arrangements: Spouse/significant other Available Help at Discharge: Family Type of Home: House Home Access: Ramped entrance     Home Layout: One level Home Equipment: Environmental consultant - 2 wheels;Walker - 4 wheels      Prior Function                 Hand Dominance        Extremity/Trunk Assessment   Upper Extremity Assessment Upper Extremity Assessment: Generalized weakness    Lower Extremity Assessment Lower Extremity Assessment: Generalized weakness       Communication      Cognition Arousal/Alertness: Awake/alert   Overall Cognitive Status: Within Functional Limits for tasks assessed  General Comments      Exercises     Assessment/Plan    PT Assessment All further PT needs can be met in the next venue of care  PT Problem List         PT Treatment Interventions      PT Goals (Current goals can be found in the Care Plan section)  Acute Rehab PT Goals Patient Stated Goal: home soon PT Goal Formulation: All assessment and education complete, DC therapy    Frequency     Barriers to discharge        Co-evaluation               AM-PAC PT "6 Clicks" Daily Activity  Outcome Measure Difficulty turning over in bed  (including adjusting bedclothes, sheets and blankets)?: A Little Difficulty moving from lying on back to sitting on the side of the bed? : A Little Difficulty sitting down on and standing up from a chair with arms (e.g., wheelchair, bedside commode, etc,.)?: A Little Help needed moving to and from a bed to chair (including a wheelchair)?: A Little Help needed walking in hospital room?: A Little Help needed climbing 3-5 steps with a railing? : A Lot 6 Click Score: 17    End of Session Equipment Utilized During Treatment: Gait belt Activity Tolerance: Patient tolerated treatment well Patient left: in bed;with call bell/phone within reach;with family/visitor present   PT Visit Diagnosis: Unsteadiness on feet (R26.81)    Time: 5361-4431 PT Time Calculation (min) (ACUTE ONLY): 17 min   Charges:   PT Evaluation $PT Eval Low Complexity: 1 Low     PT G CodesKenyon Baxter, PT Pager: [phone] 04/14/2017  [facility] 04/14/2017, 4:31 PM

## 2017-04-14 NOTE — Discharge Summary (Addendum)
Physician Discharge Summary  [name] JME:[id number] DOB: 11/07/1945 DOA: 04/11/2017  PCP: [name], MD  Admit date: 04/11/2017 Discharge date: 04/14/2017  Time spent: over 30 minutes  Recommendations for Outpatient Follow-up:  1. Follow up final results of upper extremity ultrasound and adjust eliquis dosing as appropriate (discharged with 10 mg BID x 7 days with possible "soft thrombus" on preliminary read).   1. (addendum 11/19) I followed up final read with Mrs. Deihl, which is "newly forming DVT vs sluggish flow".  Recommended continuing eliquis with dose c/w treatment for DVT.  Consider repeat US as outpatient with PCP or oncology.  2. Follow up outpatient CBC/CMP 3. Follow up blood sugars as outpatient, adjust insulin as appropriate (d/c on lantus 10 and lispro 3 units TID) 4. Continue to monitor HR with diltiazem 360 mg daily (pt now in sinus rhythm) 5. Follow up with cardiology and oncology as well as radiation oncology   Discharge Diagnoses:  Principal Problem:   Atrial fibrillation with RVR ([place]) Active Problems:   Mass of right lung   SVC (superior vena cava obstruction)   Malignant neoplasm of bronchus of right upper lobe (HCC)   PAF (paroxysmal atrial fibrillation) ([place])   Hyperglycemia   Acute kidney injury ([place])   Dehydration   Discharge Condition: stable  Diet recommendation: heart healthy  Filed Weights   04/12/17 0240  Weight: 52.4 kg (115 lb 8.3 oz)    History of present illness:  [name] [name] 71 y.o.femalewith Taytum Scheck past medical history of COPD,diet-controlled T2DM,right lung mass highly suspicious for stage IV non-small cell lung cancer who is currently receiving radiation treatment. She was hospitalized [facility] couple of weeks ago for SVC syndrome and was started on high-dose steroids. She presented to the cancer center for routine follow-up, complaining of severe fatigue and intermittently lightheaded.Her blood glucose level was checked and was  noted to be greater than 600. She was sent over to the emergency department for further management.  While evaluating the patient she suddenly developed chest pain. Heart rate had increased to 150. EKG was done which revealed atrial fibrillation with RVR. Patient was given Cardizem. Cardiology was consulted. Hemodynamically patient remained stable. With Cardizem patient converted to sinus rhythm. Her chest pain resolved.Blood sugar has required insulin for control and HbA1c is 10.6%.  Hospital Course:   Steroid-induced diabetic ketosis and T2DM: Patient has [name] history of diet-controlled diabetes, only ever requiring metformin but her mother had T1DM. she has never been on insulin in the past.  - Steroids have been discontinued at this point - She's been started on insulin here with lantus 10 units nightly and 3 units of lispro TID with meals - Consult diabetes coordinator for education. HbA1c of 10.6% suggests hyperglycemia has been an issue prior to steroids. Unfortunately, this is [name] moving target since steroids are stopped. As her goals of care are not purely palliative, I suspect insulin will be required for adequate treatment at least in the short term.  Atrial fibrillation with RVR:TSH recently 1.996.  She has had difficult rate to control and had rapid response called on 11/16 PM.  At that time received IV metoprolol x 2.  Her diltiazem was uptitrated to 90q6 and converted to 360 mg XR at discharge.  She converted to sinus rhythm at 1947 on 04/13/17.   - Continue po diltiazem - Cardiology was recommending outpatient cardiac monitoring which pt declines due to akin irritation due to radiation, but patient had recurrent atrial fibrillation after  cardiology signed off.  Due to recurrent afib with RVR, diltiazem was uptitrated and eliquis was started.  Will have pt follow up with cardiology as outpatient as planned.  - Due toCHA2DS2-VAScscore of 4, anticoagulation was recommended.   Initially, patient was opposed to this and had concerns regarding the cost.  I discussed again with the patient the potential benefits vs risk of possible stroke with atrial fibrillation and patient was willing to take this medication (discussed cost would be about 45$/month based on my discussion with her pharmacy, which she noted would be ok).  Discontinued ASA for primary prevention - Monitor K, Mg, replacing as needed.  Upper extremity swelling: noted on day of discharge.  I suspect this may be related to her SVC syndrome?, but obtained dopplers of UE which were notable for Mysti Haley preliminary read of "possible soft thrombus vs significant sluggish flow in R axillary at the axilla".  Discussed possibility of thrombus and need for change in eliquis dose if this is the case.  Discussed with patient and for now will increase dose to 10 mg BID x 7 days with plans to resume 5 mg dosing after that.  Will change plan accordingly based on final read (tried to discuss with vascular surgeon who reads final images, but not available for him to review yet).  SVCsyndrome:Due to lung mass compression, appears to be improving with steroids/XRT.  - Continue palliative XRT while here.  Stage IV NSCLC, poorly-differentiated.  - Follow up with oncology as outpatient.  COPD:Chronic, stable.  - Continue home treatments - ppears to be stable. Continue home medications.  Acute kidney injury: Due to dehydration from osmotic diuresis and AFib w/RVR. Resolved.   Leukocytosis: Improved  Procedures: 11/18 Preliminary Bilateral Upper Extremity venous Duplex Bilateral upper extremity venous duplex completed.    Preliminary report:  There is no occlusive DVT or SVT noted in the bilateral upper extremities.  There is some possible soft thrombus vs. Significant sluggish flow noted in the right axillary at the axilla.  There is sluggish flow throughout the bilateral jugular, subclavian, and axillary  veins.  Consultations:  cardiology  Discharge Exam: Vitals:   04/14/17 0957 04/14/17 1401  BP:  (!) 120/51  Pulse:  80  Resp:  20  Temp:  97.8 F (36.6 C)  SpO2: 97% 95%   Feeling ok.  No CP, no SOB, no palpitations.  Notes worsened swelling to bilateral upper extremities.  General: No acute distress. Cardiovascular: Heart sounds show Angelos Wasco regular rate, and rhythm. No gallops or rubs. No murmurs. No JVD. Lungs: Clear to auscultation bilaterally with good air movement. No rales, rhonchi or wheezes. Abdomen: Soft, nontender, nondistended with normal active bowel sounds. No masses. No hepatosplenomegaly. Neurological: Alert and oriented 3. Moves all extremities 4 with equal strength. Cranial nerves II through XII grossly intact. Skin: Warm and dry. No rashes or lesions. Extremities: No clubbing or cyanosis. No lower extremity edema, bilateral upper extremities edematous R>L.  Psychiatric: Mood and affect are normal. Insight and judgment are appropriate.  Discharge Instructions   Discharge Instructions    Call MD for:  difficulty breathing, headache or visual disturbances   Complete by:  As directed    Call MD for:  extreme fatigue   Complete by:  As directed    Call MD for:  hives   Complete by:  As directed    Call MD for:  persistant dizziness or light-headedness   Complete by:  As directed    Call MD for:  persistant nausea and vomiting   Complete by:  As directed    Call MD for:  redness, tenderness, or signs of infection (pain, swelling, redness, odor or green/yellow discharge around incision site)   Complete by:  As directed    Call MD for:  severe uncontrolled pain   Complete by:  As directed    Call MD for:  temperature >100.4   Complete by:  As directed    Diet - low sodium heart healthy   Complete by:  As directed    Diet - low sodium heart healthy   Complete by:  As directed    Discharge instructions   Complete by:  As directed    You were seen for high  blood sugars.  We've started you on insulin.  Take 10 units of lantus nightly as well as 3 units of lispro with meals.  Check your blood sugars before meals and before bedtime and bring this into your primary care doctor so they can adjust your dose of insulin.  If you have low blood sugars (<70) or symptoms of low blood sugars (shaking, jitteriness, sweating), drink some juice and recheck your blood sugar in Shaolin Armas few minutes.  If this happens please call your primary doctor about instructions for what changes to make with your insulin regimen.  The hope is that with stopping the steroids, the blood sugars will improve, but you will probably continue to need the insulin although the dose may change.  You also had atrial fibrillation.  We increased your diltiazem to 360 mg daily.  We also started you on eliquis to help prevent strokes.  Please follow up with cardiology.  We did an ultrasound of your upper extremities before you left and it showed Danyka Merlin possible blood clot.  Because of this, we will increase your eliquis to 10 mg (2 pills) twice daily for 7 days (your first dose was tonight in the hospital) and then take 5 mg twice Advik Weatherspoon day.  This was the "preliminary" read, so if the final result changes, your primary care doctor or I may change these instructions, but for now we will treat as if there is Imanol Bihl blood clot and follow up on the final results.   Increase activity slowly   Complete by:  As directed    Increase activity slowly   Complete by:  As directed      Discharge Medication List as of 04/14/2017  6:09 PM    START taking these medications   Details  apixaban (ELIQUIS) 5 MG TABS tablet Take 10 mg twice daily for 7 days then 5 mg twice daily (please follow up with pcp for further instructions), Normal    blood glucose meter kit and supplies KIT Dispense based on patient and insurance preference. Use up to four times daily as directed. (FOR ICD-9 250.00, 250.01)., Print    diltiazem (CARDIZEM CD)  360 MG 24 hr capsule Take 1 capsule (360 mg total) daily by mouth., Starting Sun 04/14/2017, Until Tue 05/14/2017, Normal    insulin glargine (LANTUS) 100 UNIT/ML injection Inject 0.1 mLs (10 Units total) at bedtime into the skin., Starting Sun 04/14/2017, Normal    insulin lispro (HUMALOG) 100 UNIT/ML injection Inject 0.03 mLs (3 Units total) 3 (three) times daily with meals into the skin. Do not take if you don't eat Maranda Marte meal., Starting Sun 04/14/2017, Normal      CONTINUE these medications which have NOT CHANGED   Details  acetaminophen (TYLENOL) 500 MG tablet  Take 500 mg by mouth every 8 (eight) hours as needed for mild pain or headache., Historical Med    citalopram (CELEXA) 20 MG tablet Take 20 mg daily by mouth. , Historical Med    Glycopyrrolate-Formoterol (BEVESPI AEROSPHERE) 9-4.8 MCG/ACT AERO Inhale 2 puffs 2 (two) times daily into the lungs., Starting Mon 04/08/2017, Sample    Guaifenesin (MUCINEX MAXIMUM STRENGTH) 1200 MG TB12 Take 1,200 mg by mouth 2 (two) times daily., Historical Med    ipratropium-albuterol (DUONEB) 0.5-2.5 (3) MG/3ML SOLN Take 3 mLs by nebulization every 6 (six) hours as needed., Starting Fri 03/08/2017, Normal    losartan (COZAAR) 50 MG tablet Take 50 mg by mouth daily., Starting Wed 12/26/2016, Historical Med    non-metallic deodorant Jethro Poling) MISC Apply 1 application topically., Starting Wed 04/03/2017, Historical Med    nystatin (MYCOSTATIN) 100000 UNIT/ML suspension Take 5 mLs (500,000 Units total) 3 (three) times daily by mouth. Make sure to swish and swallow., Starting Mon 04/08/2017, Normal    polyethylene glycol (MIRALAX / GLYCOLAX) packet Take 17 g by mouth daily., Starting Wed 02/27/2017, Normal    Respiratory Therapy Supplies (FLUTTER) DEVI 1 Device as needed by Does not apply route., Starting Mon 04/08/2017, Print    simvastatin (ZOCOR) 40 MG tablet Take 40 mg by mouth every evening., Historical Med    Spacer/Aero Chamber Mouthpiece MISC 1 Device  by Does not apply route as directed., Starting Fri 03/08/2017, Print    Wound Dressings (SONAFINE) Apply 1 application topically 2 (two) times daily. Apply after rad txs and at bedtimes daily,nothing 4 hours prior to rad tx, Starting Fri 03/29/2017, Historical Med      STOP taking these medications     aspirin 81 MG tablet      dexamethasone (DECADRON) 4 MG tablet        Allergies  Allergen Reactions  . Penicillins Hives    Has patient had Shaquon Gropp PCN reaction causing immediate rash, facial/tongue/throat swelling, SOB or lightheadedness with hypotension: yes Has patient had Vince Ainsley PCN reaction causing severe rash involving mucus membranes or skin necrosis: no Has patient had Elvie Palomo PCN reaction that required hospitalization: yes Has patient had Jinnifer Montejano PCN reaction occurring within the last 10 years: no If all of the above answers are "NO", then may proceed with Cephalosporin use.   . Tiotropium Bromide Monohydrate Other (See Comments)    EYE PAIN KIDNEY FUNCTION SLOWED DOWN   Follow-up Information    Pixie Casino, MD Follow up on 05/09/2017.   Specialty:  Cardiology Why:  Cardiology Follow-Up on 05/09/2017 at 8:30AM.  Contact information: [place] [address] [phone]        [name], MD Follow up.   Specialty:  Family Medicine Contact information: [place] [id number] [place], [facility] Follow up.   Specialty:  [place] Why:  Home Health RN, Physical Therapy, aide and Social Worker-agency will call to arrange initial appointment Contact information: [address] [place] [address] [phone]            The results of significant diagnostics from this hospitalization (including imaging, microbiology, ancillary and laboratory) are listed below for reference.    Significant Diagnostic Studies: Dg Chest 2 View  Result Date: 04/11/2017 CLINICAL DATA:   Hyperglycemia.  History of lung cancer. EXAM: CHEST  2 VIEW COMPARISON:  Chest CT 03/25/2017 FINDINGS: Chronic right upper lobe collapse. Known chronic nodular  opacity in the left mid lung. No evidence of pneumonia or edema. No effusion or pneumothorax. Normal heart size. IMPRESSION: Known right upper lobe collapse and cavitary left lung nodule. No acute superimposed finding. Electronically Signed   By: Monte Fantasia M.D.   On: 04/11/2017 11:46   Nm Pet Image Initial (pi) Skull Base To Thigh  Result Date: 03/25/2017 CLINICAL DATA:  Initial treatment strategy for lung mass. EXAM: NUCLEAR MEDICINE PET SKULL BASE TO THIGH TECHNIQUE: 6.3 MCi F-18 FDG was injected intravenously. Full-ring PET imaging was performed from the skull base to thigh after the radiotracer. CT data was obtained and used for attenuation correction and anatomic localization. FASTING BLOOD GLUCOSE:  Value: 180 mg/dl COMPARISON:  CT chest 02/23/2017 FINDINGS: NECK: No hypermetabolic lymph nodes in the neck. CHEST: Small right pleural effusion noted. The central right perihilar and right paratracheal lung mass exhibits intense FDG uptake with an SUV max equal to 6.28. There is occlusion of the right upper lobe bronchus with postobstructive consolidation and atelectasis which is now involving the entire right upper lobe. There is Talin Feister cavitary lung nodule within the left upper lobe which measures 2.4 cm and has an SUV max equal to 9.73. No hypermetabolic sub- carinal, left-sided mediastinal or left hilar hypermetabolic adenopathy. ABDOMEN/PELVIS: No abnormal hypermetabolic activity within the liver, pancreas, adrenal glands, or spleen. Aortic atherosclerosis noted. No significant FDG uptake associated with the low-attenuation right adrenal nodule compatible with benign adenoma. No hypermetabolic lymph nodes in the abdomen or pelvis. SKELETON: No focal hypermetabolic activity to suggest skeletal metastasis. IMPRESSION: 1. Central obstructing right  paratracheal and right hilar lung mass exhibits intense FDG uptake and there is associated complete atelectasis and consolidation of the right upper lobe. 2. Cavitary lung lesion within the left upper lobe is also hypermetabolic and may represent Rc Amison focus of metastatic disease or Kortni Hasten second primary lung neoplasm. 3. No evidence for distant hypermetabolic metastasis 4. Aortic atherosclerosis. Electronically Signed   By: Kerby Moors M.D.   On: 03/25/2017 09:05   Ct Biopsy  Result Date: 03/27/2017 CLINICAL DATA:  Obstructing hypermetabolic right upper lobe mass. Bronchoscopic biopsies have been nondiagnostic. Percutaneous biopsy is requested. Additional cavitary lingular lesion. EXAM: CT GUIDED CORE BIOPSY OF RIGHT UPPER LOBE LUNG MASS ANESTHESIA/SEDATION: Intravenous Fentanyl and Versed were administered as conscious sedation during continuous monitoring of the patient's level of consciousness and physiological / cardiorespiratory status by the radiology RN, with Charice Zuno total moderate sedation time of 11 minutes. PROCEDURE: The procedure risks, benefits, and alternatives were explained to the patient. Questions regarding the procedure were encouraged and answered. The patient understands and consents to the procedure. Select axial scans through the thorax were obtained. The dominant obstructing right lesion was localized with comparison to previous PET-CT using consistent anatomic landmarks. An appropriate skin entry site was determined and marked. The operative field was prepped with chlorhexidinein Taleeya Blondin sterile fashion, and Ketara Cavness sterile drape was applied covering the operative field. Kylia Grajales sterile gown and sterile gloves were used for the procedure. Local anesthesia was provided with 1% Lidocaine. Under CT fluoroscopic guidance, [address] 17 gauge trocar needle was advanced to the margin of the lesion. Once needle tip position was confirmed at the periphery of the lesion, multiple coaxial 18-gauge 3 cm core biopsy samples were  obtained, submitted in formalin to surgical pathology. The guide needle was removed. Postprocedure scans show Chay Mazzoni small amount of gas in the pleural space anterior to the collapsed right upper lobe despite the fact that the needle did  not traverse aerated lung, suggesting some gas was entrained through the guide needle during biopsy. This was stable on subsequent imaging over 13 minutes, although Cher Egnor developing right pleural effusion was identified. Patient remained asymptomatic and hemodynamically stable for additional 30 minutes of observation. The patient tolerated the procedure well. COMPLICATIONS: Small pneumothorax and right pleural effusion, asymptomatic. SIR level Carmela Piechowski: No therapy, no consequence. FINDINGS: The right upper lobe lesion was localized with comparison to recent PET-CT. Representative core biopsy samples were obtained. Small asymptomatic right pneumothorax and pleural effusion noted post biopsy. IMPRESSION: 1. Technically successful CT-guided core biopsy, right upper lobe lung lesion. Electronically Signed   By: Lucrezia Europe M.D.   On: 03/27/2017 12:55   Dg Chest Port 1 View  Result Date: 03/18/2017 CLINICAL DATA:  Status post lymph node biopsy EXAM: PORTABLE CHEST 1 VIEW COMPARISON:  Chest radiograph February 22, 2017 and chest CT February 23, 2017 FINDINGS: No demonstrable pneumothorax. There is volume loss the right with airspace consolidation in the right upper lobe. The area of apparent mass in the right upper lobe is not well seen by radiography. Adenopathy is noted in the right hilar region. The left lung is clear except for mild left midlung and left base atelectatic change. Heart size is normal. There is aortic atherosclerosis. There is postoperative change in the lower cervical spine. IMPRESSION: No pneumothorax. Opacity with volume loss right upper lobe. Previously noted masslike area right upper lobe not well seen currently. There is adenopathy in the right hilar region. No edema or  consolidation on the left. Areas of atelectasis noted in left mid lung and left base. There is aortic atherosclerosis. Aortic Atherosclerosis (ICD10-I70.0). Electronically Signed   By: Lowella Grip III M.D.   On: 03/18/2017 10:06    Microbiology: Recent Results (from the past 240 hour(s))  MRSA PCR Screening     Status: None   Collection Time: 04/11/17  6:36 PM  Result Value Ref Range Status   MRSA by PCR NEGATIVE NEGATIVE Final    Comment:        The GeneXpert MRSA Assay (FDA approved for NASAL specimens only), is one component of Zivah Mayr comprehensive MRSA colonization surveillance program. It is not intended to diagnose MRSA infection nor to guide or monitor treatment for MRSA infections.      Labs: Basic Metabolic Panel: Recent Labs  Lab 04/11/17 0852 04/11/17 1900 04/12/17 0054 04/13/17 0619 04/14/17 0507  NA 127* 132* 133* 133* 132*  K 5.7 No visable hemolysis* 4.0 4.0 3.8 4.0  CL  --  99* 103 102 100*  CO2 _0 GLUCOSE 678* 384* 193* 175* 197*  BUN 44.9* 37* 37* 36* 28*  CREATININE 1.2* 0.62 0.50 0.45 0.40*  CALCIUM 8.7 8.2* 8.0* 7.9* 8.1*  MG  --   --   --  1.8  --    Liver Function Tests: Recent Labs  Lab 04/11/17 0852 04/12/17 0054  AST 8 11*  ALT 26 22  ALKPHOS 65 46  BILITOT 0.84 0.9  PROT 5.6* 4.9*  ALBUMIN 3.2* 2.9*   No results for input(s): LIPASE, AMYLASE in the last 168 hours. No results for input(s): AMMONIA in the last 168 hours. CBC: Recent Labs  Lab 04/11/17 0852 04/12/17 0054 04/13/17 0619 04/14/17 0507  WBC 18.6* 14.7* 8.3 9.1  NEUTROABS 17.0*  --   --   --   HGB 13.6 11.9* 12.6 12.6  HCT 42.6 35.4* 38.3 37.8  MCV 99.1 95.2 95.5 95.9  PLT 213 178 176 183   Cardiac Enzymes: Recent Labs  Lab 04/11/17 1900 04/12/17 0054 04/12/17 0649  TROPONINI 0.06* 0.05* 0.05*   BNP: BNP (last 3 results) Recent Labs    02/22/17 2249  BNP 15.0    ProBNP (last 3 results) No results for input(s): PROBNP in the last  8760 hours.  CBG: Recent Labs  Lab 04/13/17 1638 04/13/17 2215 04/14/17 0839 04/14/17 1131 04/14/17 1624  GLUCAP 262* 177* 186* 218* 167*       Signed:  Fayrene Helper MD.  [facility] 04/14/2017, 7:15 PM

## 2017-04-14 NOTE — Care Management Important Message (Signed)
Important Message  Patient Details  Name: DELONNA NEY MRN: [id number] Date of Birth: 1945/07/08   Medicare Important Message Given:  Yes    Erenest Rasher, RN 04/14/2017, 6:07 PM

## 2017-04-15 ENCOUNTER — Ambulatory Visit
Admission: RE | Admit: 2017-04-15 | Discharge: 2017-04-15 | Disposition: A | Discharge status: Home / Self Care | Payer: Medicare Other | Source: Ambulatory Visit | Attending: Radiation Oncology | Admitting: Radiation Oncology

## 2017-04-15 ENCOUNTER — Telehealth: Payer: Self-pay | Admitting: Family Medicine

## 2017-04-15 DIAGNOSIS — Z510 Encounter for antineoplastic radiation therapy: Secondary | ICD-10-CM | POA: Diagnosis not present

## 2017-04-16 ENCOUNTER — Other Ambulatory Visit: Payer: Self-pay | Admitting: Radiation Oncology

## 2017-04-16 ENCOUNTER — Ambulatory Visit
Admission: RE | Admit: 2017-04-16 | Discharge: 2017-04-16 | Disposition: A | Discharge status: Home / Self Care | Payer: Medicare Other | Source: Ambulatory Visit | Attending: Radiation Oncology | Admitting: Radiation Oncology

## 2017-04-16 ENCOUNTER — Encounter: Payer: Self-pay | Admitting: *Deleted

## 2017-04-16 DIAGNOSIS — Z510 Encounter for antineoplastic radiation therapy: Secondary | ICD-10-CM | POA: Diagnosis not present

## 2017-04-16 DIAGNOSIS — C3411 Malignant neoplasm of upper lobe, right bronchus or lung: Secondary | ICD-10-CM

## 2017-04-16 NOTE — Progress Notes (Signed)
Oncology Nurse Navigator Documentation  Oncology Nurse Navigator Flowsheets 04/16/2017  Navigator Location [facility]  Navigator Encounter Type Other/I followed up on [name] schedule. Per Kristin's last note, patient needs to be seen in 7-10 days after the appt on 11/15.  I follow up with Dr. Julien Nordmann. He states patient should be seen next week.  I updated scheduling to call and schedule with [name] on 04/23/17 with labs and when Dr. Julien Nordmann is here in the office.   Treatment Phase Treatment  Barriers/Navigation Needs Coordination of Care  Interventions Coordination of Care  Coordination of Care Other  Acuity Level 2  Time Spent with Patient 30

## 2017-04-16 NOTE — Progress Notes (Signed)
04/16/2017 0919 NCM contacted [facility] to follow up on referral. Unable to process referral. Cancelled referral with [facility] and [place] arranged with [facility].  Jonnie Finner RN CCM Case Mgmt phone [phone]

## 2017-04-17 ENCOUNTER — Ambulatory Visit
Admission: RE | Admit: 2017-04-17 | Discharge: 2017-04-17 | Disposition: A | Discharge status: Home / Self Care | Payer: Medicare Other | Source: Ambulatory Visit | Attending: Radiation Oncology | Admitting: Radiation Oncology

## 2017-04-17 ENCOUNTER — Encounter: Payer: Self-pay | Admitting: Radiation Oncology

## 2017-04-17 DIAGNOSIS — Z510 Encounter for antineoplastic radiation therapy: Secondary | ICD-10-CM | POA: Diagnosis not present

## 2017-04-17 NOTE — Progress Notes (Signed)
I saw the patient yesterday after her radiation treatment. She has a history of advanced lung cancer and has been receiving daily radiotherapy given clinical SVC syndrome. She reports her breathing is stable. She denies any progressive shortness of breath and continues with 2L O2 via [place]. She did have some concerns for pleural effusion on her pretreatment films today. She appears tired and reports she's waiting to find out the next steps with chemotherapy, but has not yet started any systemictreatment. On exam she has mild tachycardia in the 105-108 range. She has been tachycardic since we met. She also has a stable BP.  On exam she has a tachycardic rate, but normal rhythm. No C/R/M are noted. Decreased breath sounds are noted at the right base. No consolidation or changes in the remainder of the right lung are noted. She still has changes of the chest wall that is consistent with collateral vessels visible beneath the skin.   We discussed her case. Although there appears to be a pleural effusion in the right lung base, it appears small and clinically she's not had any compromise I can see or detect. She will keep us informed of her progress and if her effusion becomes larger or she is more symptomatic, we could consider thoracentesis, but I don't think this would help her at this time.      Alison C. Perkins, PAC  

## 2017-04-19 ENCOUNTER — Telehealth: Payer: Self-pay | Admitting: Internal Medicine

## 2017-04-19 NOTE — Telephone Encounter (Signed)
Spoke with patient regarding appt added per 11/21 sch msg.

## 2017-04-22 ENCOUNTER — Ambulatory Visit (HOSPITAL_BASED_OUTPATIENT_CLINIC_OR_DEPARTMENT_OTHER): Payer: Medicare Other | Admitting: Medical

## 2017-04-22 ENCOUNTER — Ambulatory Visit (HOSPITAL_BASED_OUTPATIENT_CLINIC_OR_DEPARTMENT_OTHER): Payer: Medicare Other

## 2017-04-22 ENCOUNTER — Telehealth: Payer: Self-pay | Admitting: *Deleted

## 2017-04-22 ENCOUNTER — Ambulatory Visit
Admission: RE | Admit: 2017-04-22 | Discharge: 2017-04-22 | Disposition: A | Discharge status: Home / Self Care | Payer: Medicare Other | Source: Ambulatory Visit | Attending: Radiation Oncology | Admitting: Radiation Oncology

## 2017-04-22 ENCOUNTER — Other Ambulatory Visit: Payer: Self-pay | Admitting: Medical

## 2017-04-22 ENCOUNTER — Other Ambulatory Visit: Payer: Self-pay | Admitting: Radiation Oncology

## 2017-04-22 ENCOUNTER — Ambulatory Visit (HOSPITAL_COMMUNITY)
Admission: RE | Admit: 2017-04-22 | Discharge: 2017-04-22 | Disposition: A | Discharge status: Home / Self Care | Payer: Medicare Other | Source: Ambulatory Visit | Attending: Medical | Admitting: Medical

## 2017-04-22 VITALS — BP 93/62 | HR 111 | Temp 98.1°F | Resp 24 | Ht 60.0 in | Wt 127.4 lb

## 2017-04-22 DIAGNOSIS — I871 Compression of vein: Secondary | ICD-10-CM

## 2017-04-22 DIAGNOSIS — C3411 Malignant neoplasm of upper lobe, right bronchus or lung: Secondary | ICD-10-CM

## 2017-04-22 DIAGNOSIS — J90 Pleural effusion, not elsewhere classified: Secondary | ICD-10-CM

## 2017-04-22 DIAGNOSIS — D729 Disorder of white blood cells, unspecified: Secondary | ICD-10-CM

## 2017-04-22 DIAGNOSIS — E860 Dehydration: Secondary | ICD-10-CM

## 2017-04-22 LAB — COMPREHENSIVE METABOLIC PANEL
ALBUMIN: 2.4 g/dL — AB (ref 3.5–5.0)
ALT: 15 U/L (ref 0–55)
AST: 9 U/L (ref 5–34)
Alkaline Phosphatase: 58 U/L (ref 40–150)
Anion Gap: 9 mEq/L (ref 3–11)
BILIRUBIN TOTAL: 0.46 mg/dL (ref 0.20–1.20)
BUN: 21.3 mg/dL (ref 7.0–26.0)
CALCIUM: 9.4 mg/dL (ref 8.4–10.4)
CO2: 29 mEq/L (ref 22–29)
Chloride: 97 mEq/L — ABNORMAL LOW (ref 98–109)
Creatinine: 0.6 mg/dL (ref 0.6–1.1)
GLUCOSE: 158 mg/dL — AB (ref 70–140)
POTASSIUM: 3.9 meq/L (ref 3.5–5.1)
SODIUM: 135 meq/L — AB (ref 136–145)
TOTAL PROTEIN: 6 g/dL — AB (ref 6.4–8.3)

## 2017-04-22 LAB — CBC WITH DIFFERENTIAL/PLATELET
BASO%: 0.2 % (ref 0.0–2.0)
Basophils Absolute: 0 10*3/uL (ref 0.0–0.1)
EOS%: 0.5 % (ref 0.0–7.0)
Eosinophils Absolute: 0 10*3/uL (ref 0.0–0.5)
HEMATOCRIT: 36 % (ref 34.8–46.6)
HEMOGLOBIN: 11.6 g/dL (ref 11.6–15.9)
LYMPH%: 8.1 % — AB (ref 14.0–49.7)
MCH: 31.4 pg (ref 25.1–34.0)
MCHC: 32.2 g/dL (ref 31.5–36.0)
MCV: 97.6 fL (ref 79.5–101.0)
MONO#: 0.3 10*3/uL (ref 0.1–0.9)
MONO%: 4.3 % (ref 0.0–14.0)
NEUT%: 86.9 % — AB (ref 38.4–76.8)
NEUTROS ABS: 5 10*3/uL (ref 1.5–6.5)
Platelets: 187 10*3/uL (ref 145–400)
RBC: 3.69 10*6/uL — ABNORMAL LOW (ref 3.70–5.45)
RDW: 14.8 % — ABNORMAL HIGH (ref 11.2–14.5)
WBC: 5.8 10*3/uL (ref 3.9–10.3)
lymph#: 0.5 10*3/uL — ABNORMAL LOW (ref 0.9–3.3)

## 2017-04-22 MED ORDER — DOXYCYCLINE HYCLATE 100 MG PO TABS: 100.0000 mg | ORAL_TABLET | Freq: Two times a day (BID) | ORAL | 0 refills | Status: DC

## 2017-04-22 NOTE — Progress Notes (Signed)
Pt very fatigued and is generalized discomfort from swelling and SOB. Transported to radiology via w/c for CXR. Husband in attendance.  Pt to go home after cxr. Sandi Mealy, PA to call her with CXR results.  Pt to return tomorrow for scheduled clinic visit and XRT, possible thoracentesis. Pt and husband aware

## 2017-04-22 NOTE — Telephone Encounter (Signed)
Called patient to inform of thoracentesis for 04-23-17 - arrival time - 12:45 pm @ [facility], lvm for a return call

## 2017-04-22 NOTE — Telephone Encounter (Signed)
xxxx 

## 2017-04-22 NOTE — Progress Notes (Addendum)
Asked to see PA Shona Simpson, after rad tx 15/30 lung, over the weekend patient  Very fatigued,weeak, drinks water stated plenty but has dark urine, arms are wrapped  Secondary to weeping, her Primary Md to see 04/13/17,  And Dr. Julien Nordmann she believes tomorrow, spouse stated she sits up in recliner and only gets up to go to [facility] 10:53 AM BP 126/73   Pulse (!) 104   Temp 97.8 F (36.6 C)   Resp 20   Wt 127 lb 12.8 oz (58 kg)   SpO2 92% Comment: on 2 liters n/c  BMI 24.96 kg/m   Last weight on 04/16/17= 127.6 lb

## 2017-04-23 ENCOUNTER — Ambulatory Visit
Admission: RE | Admit: 2017-04-23 | Discharge: 2017-04-23 | Disposition: A | Discharge status: Home / Self Care | Payer: Medicare Other | Source: Ambulatory Visit | Attending: Radiation Oncology | Admitting: Radiation Oncology

## 2017-04-23 ENCOUNTER — Telehealth: Payer: Self-pay | Admitting: Oncology

## 2017-04-23 ENCOUNTER — Other Ambulatory Visit: Payer: Self-pay | Admitting: Internal Medicine

## 2017-04-23 ENCOUNTER — Ambulatory Visit (HOSPITAL_BASED_OUTPATIENT_CLINIC_OR_DEPARTMENT_OTHER): Payer: Medicare Other | Admitting: Oncology

## 2017-04-23 ENCOUNTER — Ambulatory Visit (HOSPITAL_COMMUNITY): Payer: Medicare Other

## 2017-04-23 VITALS — HR 62 | Temp 97.9°F | Resp 17 | Ht 60.0 in | Wt 126.7 lb

## 2017-04-23 DIAGNOSIS — Z5111 Encounter for antineoplastic chemotherapy: Secondary | ICD-10-CM | POA: Insufficient documentation

## 2017-04-23 DIAGNOSIS — J90 Pleural effusion, not elsewhere classified: Secondary | ICD-10-CM | POA: Diagnosis not present

## 2017-04-23 DIAGNOSIS — C3411 Malignant neoplasm of upper lobe, right bronchus or lung: Secondary | ICD-10-CM

## 2017-04-23 MED ORDER — PROCHLORPERAZINE MALEATE 10 MG PO TABS: 10.0000 mg | ORAL_TABLET | Freq: Four times a day (QID) | ORAL | 1 refills | Status: DC | PRN

## 2017-04-23 NOTE — Progress Notes (Signed)
[facility] Progress Note   [name] [id number] July 02, 1945 71 y.o.  [name] is managed by Dr. Fanny Baxter. [name]  Actively treated with chemotherapy: no  Current Therapy: XRT to chest  Last Treated: 11 / 26 / 2018  Assessment: Plan:    SVC syndrome  Neutrophilia  Pleural effusion on right  SVC syndrome: The patient continues to have radiation therapy to her chest.  She has not yet begun chemotherapy.  Neutrophilia: A CBC returned today with neutrophils at 86.9%.  The patient was given a prescription for doxycycline 100 mg p.o. twice daily times 7 days for respiratory symptoms and for open lesions in her bilateral forearms.  Right pleural effusion: The patient was referred for a chest x-ray which showed a small right pleural effusion.  The patient will return to clinic tomorrow for a previously scheduled appointment at which time discussion will be undertaken to determine if it is appropriate for the patient to proceed with a therapeutic thoracentesis.  Please see After Visit Summary for patient specific instructions.  Future Appointments  Date Time Provider G. L. Garcia  04/23/2017 10:15 AM [facility] 1 [facility] None  04/23/2017  1:00 PM [facility] 3 [facility]  04/24/2017 10:15 AM [facility] LIN[facility] 1 [facility] None  04/25/2017 10:15 AM [facility] LIN[facility] 1 [facility] None  04/26/2017 10:15 AM [facility] LIN[facility] 1 [facility] None  04/29/2017 10:15 AM [facility] LINAC 1 [facility] None  04/30/2017 10:15 AM [facility] LINAC 1 [facility] None  05/01/2017 10:15 AM [facility] LIN[facility] 1 [facility] None  05/02/2017 10:15 AM [facility] LINAC 1 [facility] None  05/03/2017 10:15 AM [facility] LIN[facility] 1 [facility] None  05/06/2017 10:15 AM [facility] LINAC 1 [facility] None  05/07/2017 10:15 AM [facility] LINAC 1 [facility] None  05/08/2017 10:15 AM [facility] LINAC 1 [facility] None  05/09/2017  8:30 AM Hilty, Nadean Corwin, MD [facility]  05/09/2017 10:15 AM [facility] LINAC 1 [facility] None  05/10/2017 10:15 AM [facility] LINAC 1 [facility] None  05/13/2017 10:15 AM [facility] LINAC 1 [facility] None  05/14/2017 10:15 AM [facility] LINAC 1 [facility] None  05/15/2017 10:15 AM [facility] LINAC 1 [facility] None  05/16/2017 10:15 AM [facility] LINAC 1 [facility] None  06/17/2017  9:00 AM [facility] None    No orders of the defined types were placed in this encounter.      Subjective:   Patient ID:  [name] is a 71 y.o. (DOB 1946/02/07) female.  Chief Complaint:  Chief Complaint  Patient presents with  . 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  [name] is currently undergoing radiation therapy to a right upper lobe mass which is causing an SVC syndrome.  She was seen in radiation therapy today at which time the [facility] was contacted and was told that the patient was having increasing shortness of breath and was on clinical exam believed to have a right lower lobe pleural effusion.  A thoracentesis has been scheduled for tomorrow at  [facility].  The patient reports that her energy level is decreased.  Her upper extremity edema and engorgement of the vascular of the chest has worsened since she has stopped prednisone.  She had been put on prednisone which helped with her symptoms  but caused her blood sugar to elevate in the 600s.  She developed A. fib and was hospitalized.  She also reports having constipation for which she plans to begin MiraLAX.  Medications: I have reviewed the patient's current medications.  Allergies:  Allergies  Allergen Reactions  . Penicillins Hives    Has patient had a PCN  reaction causing immediate rash, facial/tongue/throat swelling, SOB or lightheadedness with hypotension: yes Has patient had a PCN reaction causing severe rash involving mucus membranes or skin necrosis: no Has patient had a PCN reaction that required hospitalization: yes Has patient had a PCN reaction occurring within the last 10 years: no If all of the above answers are "NO", then may proceed with Cephalosporin use.   . Tiotropium Bromide Monohydrate Other (See Comments)    EYE PAIN KIDNEY FUNCTION SLOWED DOWN    Past Medical History:  Diagnosis Date  . Arthritis   . Asthma   . COPD (chronic obstructive pulmonary disease) ([place])   . Depression   . Diabetes mellitus    diet controlled  . History of hiatal hernia   . Hyperlipemia   . Hypertension   . Pneumonia     Past Surgical History:  Procedure Laterality Date  . CARDIAC CATHETERIZATION  1999  . CARPAL TUNNEL RELEASE  2/13   right-[facility]  . CARPAL TUNNEL RELEASE  08/15/2011   Procedure: CARPAL TUNNEL RELEASE;  Surgeon: Linna Hoff, MD;  Location: [place];  Service: Orthopedics;  Laterality: Left;  . CATARACT EXTRACTION    . CERVICAL FUSION  2002  . COLONOSCOPY    . ENDOBRONCHIAL ULTRASOUND Bilateral 03/18/2017   Procedure: ENDOBRONCHIAL ULTRASOUND;  Surgeon: Javier Glazier, MD;  Location: [facility];  Service: Cardiopulmonary;  Laterality: Bilateral;  . TONSILLECTOMY    . VIDEO BRONCHOSCOPY Bilateral 02/25/2017   Procedure: VIDEO BRONCHOSCOPY WITHOUT FLUORO;  Surgeon: Javier Glazier, MD;  Location: [facility];  Service: Cardiopulmonary;  Laterality: Bilateral;    Family History  Problem Relation Age of Onset  . Diabetes Mother   . Colon cancer Father   . Cerebral palsy Brother   . Breast cancer Paternal Aunt   . Breast cancer Cousin   . Prostate cancer Paternal Uncle   . Bone cancer Maternal Aunt     Social History   Socioeconomic History  . Marital status: Married    Spouse name:  Not on file  . Number of children: Not on file  . Years of education: Not on file  . Highest education level: Not on file  Social Needs  . Financial resource strain: Not on file  . Food insecurity - worry: Not on file  . Food insecurity - inability: Not on file  . Transportation needs - medical: Not on file  . Transportation needs - non-medical: Not on file  Occupational History  . Not on file  Tobacco Use  . Smoking status: Former Smoker    Packs/day: 1.00    Years: 53.00    Pack years: 53.00    Start date: 12/22/1962    Last attempt to quit: 02/25/2017    Years since quitting: 0.1  . Smokeless tobacco: Never Used  . Tobacco comment: Peak rate 1.5ppd - quit at most 6 months  Substance and Sexual Activity  . Alcohol use: No  . Drug use: No  . Sexual activity: Not Currently  Other Topics Concern  . Not on file  Social History Narrative   [facility] (02/23/17):   Originally  from [place]. Previously has lived in [place] as well as [place]. Previously worked with a Engineer, structural and in Engineer, materials. Remote exposure to a parrot. Currently lives with her husband. Retired.    Past Medical History, Surgical history, Social history, and Family history were reviewed and updated as appropriate.   Please see review of systems for further details on the patient's review from today.   Review of Systems:  Review of Systems  Constitutional: Positive for activity change, appetite change, fatigue and unexpected weight change. Negative for chills, diaphoresis and fever.  HENT: Negative for trouble swallowing.   Respiratory: Positive for cough, shortness of breath and wheezing. Negative for choking and chest tightness.   Cardiovascular: Positive for palpitations and leg swelling. Negative for chest pain.  Gastrointestinal: Positive for constipation. Negative for nausea and vomiting.  Skin:       Bilateral upper extremity edema with weeping of her forearms.  Engorgement of  the vascular of the chest.    Objective:   Physical Exam:  BP 93/62 (BP Location: Left Wrist, Patient Position: Sitting)   Pulse (!) 111   Temp 98.1 F (36.7 C) (Oral)   Resp (!) 24   Ht 5' (1.524 m)   Wt 127 lb 6.4 oz (57.8 kg)   SpO2 97%   BMI 24.88 kg/m   ECOG: 2  Physical Exam  Constitutional: No distress.  HENT:  Head: Normocephalic and atraumatic.  Mouth/Throat: Oropharynx is clear and moist. No oropharyngeal exudate.  Eyes: Right eye exhibits no discharge. Left eye exhibits no discharge. No scleral icterus.  Cardiovascular: S1 normal and S2 normal. Tachycardia present.  Pulmonary/Chest:      Musculoskeletal: She exhibits edema.  Neurological: She is alert. Coordination (The patient is ambulating with a wheelchair.) abnormal.  Skin: Skin is warm. She is diaphoretic.       Lab Review:     Component Value Date/Time   NA 135 (L) 04/22/2017 1227   K 3.9 04/22/2017 1227   CL 100 (L) 04/14/2017 0507   CO2 29 04/22/2017 1227   GLUCOSE 158 (H) 04/22/2017 1227   BUN 21.3 04/22/2017 1227   CREATININE 0.6 04/22/2017 1227   CALCIUM 9.4 04/22/2017 1227   PROT 6.0 (L) 04/22/2017 1227   ALBUMIN 2.4 (L) 04/22/2017 1227   AST 9 04/22/2017 1227   ALT 15 04/22/2017 1227   ALKPHOS 58 04/22/2017 1227   BILITOT 0.46 04/22/2017 1227   GFRNONAA >60 04/14/2017 0507   GFRAA >60 04/14/2017 0507       Component Value Date/Time   WBC 5.8 04/22/2017 1227   WBC 9.1 04/14/2017 0507   RBC 3.69 (L) 04/22/2017 1227   RBC 3.94 04/14/2017 0507   HGB 11.6 04/22/2017 1227   HCT 36.0 04/22/2017 1227   PLT 187 04/22/2017 1227   MCV 97.6 04/22/2017 1227   MCH 31.4 04/22/2017 1227   MCH 32.0 04/14/2017 0507   MCHC 32.2 04/22/2017 1227   MCHC 33.3 04/14/2017 0507   RDW 14.8 (H) 04/22/2017 1227   LYMPHSABS 0.5 (L) 04/22/2017 1227   MONOABS 0.3 04/22/2017 1227   EOSABS 0.0 04/22/2017 1227   BASOSABS 0.0 04/22/2017 1227   -------------------------------  Imaging from last 24  hours (if applicable):  Radiology interpretation: Dg Chest 2 View  Result Date: 04/11/2017 CLINICAL DATA:  Hyperglycemia.  History of lung cancer. EXAM: CHEST  2 VIEW COMPARISON:  Chest CT 03/25/2017 FINDINGS: Chronic right upper lobe collapse. Known chronic nodular opacity in the left mid lung. No  evidence of pneumonia or edema. No effusion or pneumothorax. Normal heart size. IMPRESSION: Known right upper lobe collapse and cavitary left lung nodule. No acute superimposed finding. Electronically Signed   By: Monte Fantasia M.D.   On: 04/11/2017 11:46   Dg Chest Right Decubitus  Result Date: 04/22/2017 CLINICAL DATA:  Recent pneumonia.  History of lung cancer EXAM: CHEST - RIGHT DECUBITUS COMPARISON:  04/11/2017 FINDINGS: Postsurgical changes in the right hilum. Small layering right pleural effusion. No left pleural effusion. No pneumothorax. Stable cardiomediastinal silhouette. No acute osseous abnormality. IMPRESSION: Small layering right pleural effusion. Electronically Signed   By: Kathreen Devoid   On: 04/22/2017 15:20   Nm Pet Image Initial (pi) Skull Base To Thigh  Result Date: 03/25/2017 CLINICAL DATA:  Initial treatment strategy for lung mass. EXAM: NUCLEAR MEDICINE PET SKULL BASE TO THIGH TECHNIQUE: 6.3 MCi F-18 FDG was injected intravenously. Full-ring PET imaging was performed from the skull base to thigh after the radiotracer. CT data was obtained and used for attenuation correction and anatomic localization. FASTING BLOOD GLUCOSE:  Value: 180 mg/dl COMPARISON:  CT chest 02/23/2017 FINDINGS: NECK: No hypermetabolic lymph nodes in the neck. CHEST: Small right pleural effusion noted. The central right perihilar and right paratracheal lung mass exhibits intense FDG uptake with an SUV max equal to 6.28. There is occlusion of the right upper lobe bronchus with postobstructive consolidation and atelectasis which is now involving the entire right upper lobe. There is a cavitary lung nodule within  the left upper lobe which measures 2.4 cm and has an SUV max equal to 9.73. No hypermetabolic sub- carinal, left-sided mediastinal or left hilar hypermetabolic adenopathy. ABDOMEN/PELVIS: No abnormal hypermetabolic activity within the liver, pancreas, adrenal glands, or spleen. Aortic atherosclerosis noted. No significant FDG uptake associated with the low-attenuation right adrenal nodule compatible with benign adenoma. No hypermetabolic lymph nodes in the abdomen or pelvis. SKELETON: No focal hypermetabolic activity to suggest skeletal metastasis. IMPRESSION: 1. Central obstructing right paratracheal and right hilar lung mass exhibits intense FDG uptake and there is associated complete atelectasis and consolidation of the right upper lobe. 2. Cavitary lung lesion within the left upper lobe is also hypermetabolic and may represent a focus of metastatic disease or a second primary lung neoplasm. 3. No evidence for distant hypermetabolic metastasis 4. Aortic atherosclerosis. Electronically Signed   By: Kerby Moors M.D.   On: 03/25/2017 09:05   Ct Biopsy  Result Date: 03/27/2017 CLINICAL DATA:  Obstructing hypermetabolic right upper lobe mass. Bronchoscopic biopsies have been nondiagnostic. Percutaneous biopsy is requested. Additional cavitary lingular lesion. EXAM: CT GUIDED CORE BIOPSY OF RIGHT UPPER LOBE LUNG MASS ANESTHESIA/SEDATION: Intravenous Fentanyl and Versed were administered as conscious sedation during continuous monitoring of the patient's level of consciousness and physiological / cardiorespiratory status by the radiology RN, with a total moderate sedation time of 11 minutes. PROCEDURE: The procedure risks, benefits, and alternatives were explained to the patient. Questions regarding the procedure were encouraged and answered. The patient understands and consents to the procedure. Select axial scans through the thorax were obtained. The dominant obstructing right lesion was localized with  comparison to previous PET-CT using consistent anatomic landmarks. An appropriate skin entry site was determined and marked. The operative field was prepped with chlorhexidinein a sterile fashion, and a sterile drape was applied covering the operative field. A sterile gown and sterile gloves were used for the procedure. Local anesthesia was provided with 1% Lidocaine. Under CT fluoroscopic guidance, a 17 gauge trocar needle was advanced  to the margin of the lesion. Once needle tip position was confirmed at the periphery of the lesion, multiple coaxial 18-gauge 3 cm core biopsy samples were obtained, submitted in formalin to surgical pathology. The guide needle was removed. Postprocedure scans show a small amount of gas in the pleural space anterior to the collapsed right upper lobe despite the fact that the needle did not traverse aerated lung, suggesting some gas was entrained through the guide needle during biopsy. This was stable on subsequent imaging over 13 minutes, although a developing right pleural effusion was identified. Patient remained asymptomatic and hemodynamically stable for additional 30 minutes of observation. The patient tolerated the procedure well. COMPLICATIONS: Small pneumothorax and right pleural effusion, asymptomatic. SIR level A: No therapy, no consequence. FINDINGS: The right upper lobe lesion was localized with comparison to recent PET-CT. Representative core biopsy samples were obtained. Small asymptomatic right pneumothorax and pleural effusion noted post biopsy. IMPRESSION: 1. Technically successful CT-guided core biopsy, right upper lobe lung lesion. Electronically Signed   By: Lucrezia Europe M.D.   On: 03/27/2017 12:55        This case was discussed with Dr. Julien Nordmann. He expressed agreement with my management of this patient.

## 2017-04-23 NOTE — Progress Notes (Signed)
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  Guardant 360 testing shows no actionable mutations  PRIOR THERAPY: None  CURRENT THERAPY: Radiation to right upper lobe lung mass.  Systemic chemotherapy with carboplatin for an AUC of 5 and paclitaxel 175 mg/m given every 3 weeks.  First dose 05/06/2017.  INTERVAL HISTORY: [name] 71 y.o. female returns for routine follow-up visit accompanied by her husband.  The patient was seen in her symptom management clinic yesterday due to increased facial and upper extremity swelling.  She was also having some increased shortness of breath and wheezing.  She was started on doxycycline with improvement in her symptoms.  She reports that she does not feel as tight or short of breath today.  She denies fevers and chills.  She denies chest pain, cough, hemoptysis.  Denies nausea, vomiting, constipation, diarrhea.  The patient is here for evaluation and to discuss her Guardant 360 testing.  MEDICAL HISTORY: Past Medical History:  Diagnosis Date  . Arthritis   . Asthma   . COPD (chronic obstructive pulmonary disease) ([place])   . Depression   . Diabetes mellitus    diet controlled  . History of hiatal hernia   . Hyperlipemia   . Hypertension   . Pneumonia     ALLERGIES:  is allergic to penicillins and tiotropium bromide monohydrate.  MEDICATIONS:  Current Outpatient Medications  Medication Sig Dispense Refill  . doxycycline (VIBRA-TABS) 100 MG tablet Take 1 tablet (100 mg total) by mouth 2 (two) times daily. 14 tablet 0  . insulin glargine (LANTUS) 100 UNIT/ML injection Inject 0.1 mLs (10 Units total) at  bedtime into the skin. 10 mL 11  . acetaminophen (TYLENOL) 500 MG tablet Take 500 mg by mouth every 8 (eight) hours as needed for mild pain or headache.    Marland Kitchen apixaban (ELIQUIS) 5 MG TABS tablet Take 10 mg twice daily for 7 days then 5 mg twice daily (please follow up with pcp for further instructions) 60 tablet 0  . blood glucose meter kit and supplies KIT Dispense based on patient and insurance preference. Use up to four times daily as directed. (FOR ICD-9 250.00, 250.01). 1 each 0  . citalopram (CELEXA) 20 MG tablet Take 20 mg daily by mouth.     . diltiazem (CARDIZEM CD) 360 MG 24 hr capsule Take 1 capsule (360 mg total) daily by mouth. 30 capsule 0  . Glycopyrrolate-Formoterol (BEVESPI AEROSPHERE) 9-4.8 MCG/ACT AERO Inhale 2 puffs 2 (two) times daily into the lungs. (Patient not taking: Reported on 04/22/2017) 1 Inhaler 0  . Guaifenesin (MUCINEX MAXIMUM STRENGTH) 1200 MG TB12 Take 1,200 mg by mouth 2 (two) times daily.    . insulin lispro (HUMALOG) 100 UNIT/ML injection Inject 0.03 mLs (3 Units total) 3 (three) times daily with meals into the skin. Do not take if you don't eat a meal. (Patient not taking: Reported on 04/23/2017) 10 mL 11  . ipratropium-albuterol (DUONEB) 0.5-2.5 (3) MG/3ML SOLN Take 3 mLs by nebulization every 6 (six) hours as needed. (Patient taking differently: Take 3 mLs by nebulization every 6 (six) hours as needed (shortness of breath). ) 120 mL 3  . losartan (COZAAR) 50 MG tablet Take  50 mg by mouth daily.  0  . non-metallic deodorant (ALRA) MISC Apply 1 application topically.    . nystatin (MYCOSTATIN) 100000 UNIT/ML suspension Take 5 mLs (500,000 Units total) 3 (three) times daily by mouth. Make sure to swish and swallow. (Patient not taking: Reported on 04/22/2017) 150 mL 1  . polyethylene glycol (MIRALAX / GLYCOLAX) packet Take 17 g by mouth daily. (Patient taking differently: Take 17 g by mouth daily as needed for mild constipation. ) 14 each 0  . prochlorperazine  (COMPAZINE) 10 MG tablet Take 1 tablet (10 mg total) by mouth every 6 (six) hours as needed for nausea or vomiting. 30 tablet 1  . Respiratory Therapy Supplies (FLUTTER) DEVI 1 Device as needed by Does not apply route. 1 each 0  . simvastatin (ZOCOR) 40 MG tablet Take 40 mg by mouth every evening.    Marland Kitchen Spacer/Aero Chamber Mouthpiece MISC 1 Device by Does not apply route as directed. 1 each 0  . Wound Dressings (SONAFINE) Apply 1 application topically 2 (two) times daily. Apply after rad txs and at bedtimes daily,nothing 4 hours prior to rad tx     No current facility-administered medications for this visit.     SURGICAL HISTORY:  Past Surgical History:  Procedure Laterality Date  . CARDIAC CATHETERIZATION  1999  . CARPAL TUNNEL RELEASE  2/13   right-[facility]  . CARPAL TUNNEL RELEASE  08/15/2011   Procedure: CARPAL TUNNEL RELEASE;  Surgeon: Linna Hoff, MD;  Location: [facility];  Service: Orthopedics;  Laterality: Left;  . CATARACT EXTRACTION    . CERVICAL FUSION  2002  . COLONOSCOPY    . ENDOBRONCHIAL ULTRASOUND Bilateral 03/18/2017   Procedure: ENDOBRONCHIAL ULTRASOUND;  Surgeon: Javier Glazier, MD;  Location: [facility];  Service: Cardiopulmonary;  Laterality: Bilateral;  . TONSILLECTOMY    . VIDEO BRONCHOSCOPY Bilateral 02/25/2017   Procedure: VIDEO BRONCHOSCOPY WITHOUT FLUORO;  Surgeon: Javier Glazier, MD;  Location: [facility];  Service: Cardiopulmonary;  Laterality: Bilateral;    REVIEW OF SYSTEMS:   Review of Systems  Constitutional: Negative for appetite change, chills, fever and unexpected weight change. Positive for fatigue. HENT:   Negative for mouth sores, nosebleeds, sore throat and trouble swallowing.   Eyes: Negative for eye problems and icterus.  Respiratory: Negative for cough, hemoptysis, shortness of breath at rest and wheezing.  Positive for shortness of breath with exertion. Cardiovascular: Negative for chest pain and leg swelling.  Positive for edema to her face and upper extremities. Gastrointestinal: Negative for abdominal pain, constipation, diarrhea, nausea and vomiting.  Genitourinary: Negative for bladder incontinence, difficulty urinating, dysuria, frequency and hematuria.   Musculoskeletal: Negative for back pain, gait problem, neck pain and neck stiffness.  Skin: Negative for itching and rash.  Neurological: Negative for dizziness, extremity weakness, gait problem, headaches, light-headedness and seizures.  Hematological: Negative for adenopathy. Does not bruise/bleed easily.  Psychiatric/Behavioral: Negative for confusion, depression and sleep disturbance. The patient is not nervous/anxious.     PHYSICAL EXAMINATION:  Pulse 62, temperature 97.9 F (36.6 C), temperature source Oral, resp. rate 17, height 5' (1.524 m), weight 126 lb 11.2 oz (57.5 kg), SpO2 95 %.  ECOG PERFORMANCE STATUS: 1 - Symptomatic but completely ambulatory  Physical Exam  Constitutional: Oriented to person, place, and time and well-developed, well-nourished, and in no distress. No distress.  HENT:  Head: Normocephalic and atraumatic.  Mouth/Throat: Oropharynx is clear and moist. No oropharyngeal exudate.  Eyes: Conjunctivae are normal. Right eye exhibits no  discharge. Left eye exhibits no discharge. No scleral icterus.  Neck: Normal range of motion. Neck supple.  Cardiovascular: Normal rate, regular rhythm, normal heart sounds and intact distal pulses.   Pulmonary/Chest: Effort normal. No respiratory distress. No wheezes. No rales. Diminished breath sounds to the right posterior base.  The patient wears O2 at 2.5 L/min. Abdominal: Soft. Bowel sounds are normal. Exhibits no distension and no mass. There is no tenderness.  Musculoskeletal: Normal range of motion.   Lymphadenopathy:    No cervical adenopathy.  Neurological: Alert and oriented to person, place, and time. Exhibits normal muscle tone. Coordination normal.  Skin: Skin is  warm and dry. No rash noted. Not diaphoretic. No erythema. No pallor.  Psychiatric: Mood, memory and judgment normal.  Vitals reviewed.  LABORATORY DATA: Lab Results  Component Value Date   WBC 5.8 04/22/2017   HGB 11.6 04/22/2017   HCT 36.0 04/22/2017   MCV 97.6 04/22/2017   PLT 187 04/22/2017      Chemistry      Component Value Date/Time   NA 135 (L) 04/22/2017 1227   K 3.9 04/22/2017 1227   CL 100 (L) 04/14/2017 0507   CO2 29 04/22/2017 1227   BUN 21.3 04/22/2017 1227   CREATININE 0.6 04/22/2017 1227      Component Value Date/Time   CALCIUM 9.4 04/22/2017 1227   ALKPHOS 58 04/22/2017 1227   AST 9 04/22/2017 1227   ALT 15 04/22/2017 1227   BILITOT 0.46 04/22/2017 1227       RADIOGRAPHIC STUDIES:  Dg Chest 2 View  Result Date: 04/11/2017 CLINICAL DATA:  Hyperglycemia.  History of lung cancer. EXAM: CHEST  2 VIEW COMPARISON:  Chest CT 03/25/2017 FINDINGS: Chronic right upper lobe collapse. Known chronic nodular opacity in the left mid lung. No evidence of pneumonia or edema. No effusion or pneumothorax. Normal heart size. IMPRESSION: Known right upper lobe collapse and cavitary left lung nodule. No acute superimposed finding. Electronically Signed   By: Monte Fantasia M.D.   On: 04/11/2017 11:46   Dg Chest Right Decubitus  Result Date: 04/22/2017 CLINICAL DATA:  Recent pneumonia.  History of lung cancer EXAM: CHEST - RIGHT DECUBITUS COMPARISON:  04/11/2017 FINDINGS: Postsurgical changes in the right hilum. Small layering right pleural effusion. No left pleural effusion. No pneumothorax. Stable cardiomediastinal silhouette. No acute osseous abnormality. IMPRESSION: Small layering right pleural effusion. Electronically Signed   By: Kathreen Devoid   On: 04/22/2017 15:20   Nm Pet Image Initial (pi) Skull Base To Thigh  Result Date: 03/25/2017 CLINICAL DATA:  Initial treatment strategy for lung mass. EXAM: NUCLEAR MEDICINE PET SKULL BASE TO THIGH TECHNIQUE: 6.3 MCi F-18 FDG  was injected intravenously. Full-ring PET imaging was performed from the skull base to thigh after the radiotracer. CT data was obtained and used for attenuation correction and anatomic localization. FASTING BLOOD GLUCOSE:  Value: 180 mg/dl COMPARISON:  CT chest 02/23/2017 FINDINGS: NECK: No hypermetabolic lymph nodes in the neck. CHEST: Small right pleural effusion noted. The central right perihilar and right paratracheal lung mass exhibits intense FDG uptake with an SUV max equal to 6.28. There is occlusion of the right upper lobe bronchus with postobstructive consolidation and atelectasis which is now involving the entire right upper lobe. There is a cavitary lung nodule within the left upper lobe which measures 2.4 cm and has an SUV max equal to 9.73. No hypermetabolic sub- carinal, left-sided mediastinal or left hilar hypermetabolic adenopathy. ABDOMEN/PELVIS: No abnormal hypermetabolic activity within the liver,  pancreas, adrenal glands, or spleen. Aortic atherosclerosis noted. No significant FDG uptake associated with the low-attenuation right adrenal nodule compatible with benign adenoma. No hypermetabolic lymph nodes in the abdomen or pelvis. SKELETON: No focal hypermetabolic activity to suggest skeletal metastasis. IMPRESSION: 1. Central obstructing right paratracheal and right hilar lung mass exhibits intense FDG uptake and there is associated complete atelectasis and consolidation of the right upper lobe. 2. Cavitary lung lesion within the left upper lobe is also hypermetabolic and may represent a focus of metastatic disease or a second primary lung neoplasm. 3. No evidence for distant hypermetabolic metastasis 4. Aortic atherosclerosis. Electronically Signed   By: Kerby Moors M.D.   On: 03/25/2017 09:05   Ct Biopsy  Result Date: 03/27/2017 CLINICAL DATA:  Obstructing hypermetabolic right upper lobe mass. Bronchoscopic biopsies have been nondiagnostic. Percutaneous biopsy is requested. Additional  cavitary lingular lesion. EXAM: CT GUIDED CORE BIOPSY OF RIGHT UPPER LOBE LUNG MASS ANESTHESIA/SEDATION: Intravenous Fentanyl and Versed were administered as conscious sedation during continuous monitoring of the patient's level of consciousness and physiological / cardiorespiratory status by the radiology RN, with a total moderate sedation time of 11 minutes. PROCEDURE: The procedure risks, benefits, and alternatives were explained to the patient. Questions regarding the procedure were encouraged and answered. The patient understands and consents to the procedure. Select axial scans through the thorax were obtained. The dominant obstructing right lesion was localized with comparison to previous PET-CT using consistent anatomic landmarks. An appropriate skin entry site was determined and marked. The operative field was prepped with chlorhexidinein a sterile fashion, and a sterile drape was applied covering the operative field. A sterile gown and sterile gloves were used for the procedure. Local anesthesia was provided with 1% Lidocaine. Under CT fluoroscopic guidance, a 17 gauge trocar needle was advanced to the margin of the lesion. Once needle tip position was confirmed at the periphery of the lesion, multiple coaxial 18-gauge 3 cm core biopsy samples were obtained, submitted in formalin to surgical pathology. The guide needle was removed. Postprocedure scans show a small amount of gas in the pleural space anterior to the collapsed right upper lobe despite the fact that the needle did not traverse aerated lung, suggesting some gas was entrained through the guide needle during biopsy. This was stable on subsequent imaging over 13 minutes, although a developing right pleural effusion was identified. Patient remained asymptomatic and hemodynamically stable for additional 30 minutes of observation. The patient tolerated the procedure well. COMPLICATIONS: Small pneumothorax and right pleural effusion, asymptomatic. SIR  level A: No therapy, no consequence. FINDINGS: The right upper lobe lesion was localized with comparison to recent PET-CT. Representative core biopsy samples were obtained. Small asymptomatic right pneumothorax and pleural effusion noted post biopsy. IMPRESSION: 1. Technically successful CT-guided core biopsy, right upper lobe lung lesion. Electronically Signed   By: Lucrezia Europe M.D.   On: 03/27/2017 12:55     ASSESSMENT/PLAN:  Malignant neoplasm of bronchus of right upper lobe [facility]) This is a very pleasant 71 year old white female with highly suspicious stage IV non-small cell lung cancer, poorly differentiated carcinoma,  presented with right upper lobe lung mass in addition to right hilar and mediastinal lymphadenopathy as well as necrotic left upper lobe nodule.  The patient was seen with Dr. Julien Nordmann.  Guardant 360 results were discussed with the patient and her husband.   We discussed that there are no mutations that we can use targeted therapy for.  Recommend that she continue on radiation.  We  discussed beginning systemic chemotherapy with carboplatin for an AUC of 5 and paclitaxel 175 mg/m.  Discussed with the patient adverse effects of this treatment including but not limited to alopecia, myelosuppression, nausea and vomiting, peripheral neuropathy, liver or renal dysfunction.  We also discussed adding immunotherapy with Keytruda after her radiation is complete. Adverse effects of this treatment were discussed including but not limited to immune mediated the skin rash, diarrhea, inflammation of the lung, kidney, liver, thyroid or other endocrine dysfunction.  The patient would like to proceed with chemotherapy.  The plan is to begin with carboplatin and Taxol initially and to add Keytruda after her radiation is complete.  The patient will have a chemotherapy education class.  A prescription for Compazine 10 mg every 6 hours as needed for nausea and vomiting was sent to her local pharmacy.  The  patient will have weekly labs while she is receiving chemotherapy.  Anticipate first dose on 05/06/2017.  She will be seen for follow-up for evaluation prior to cycle 1 on that date.  The patient was advised to call immediately if she has any concerning symptoms in the interval. The patient voices understanding of current disease status and treatment options and is in agreement with the current care plan.  All questions were answered. The patient knows to call the clinic with any problems, questions or concerns. We can certainly see the patient much sooner if necessary.  Orders Placed This Encounter  Procedures  . Comprehensive metabolic panel    Standing Status:   Standing    Number of Occurrences:   20    Standing Expiration Date:   04/23/2018  . CBC with Differential/Platelet    Standing Status:   Standing    Number of Occurrences:   20    Standing Expiration Date:   04/23/2018    Mikey Bussing, DNP, AGPCNP-BC, AOCNP 04/23/17  ADDENDUM: Hematology/Oncology Attending: I had a face-to-face encounter with the patient.  I recommended her care plan.  This is a very pleasant 71 years old white female diagnosed with metastatic poorly differentiated carcinoma.  The patient is currently undergoing palliative radiotherapy to the right upper lobe obstructive lung mass under the care of Dr. Lisbeth Renshaw.  There was insufficient material to perform studies.  We sent a blood sample to Guardant 360 but unfortunately there was no actionable mutations.  She is expected to complete the course of palliative radiotherapy on May 16, 2017. I had a lengthy discussion with the patient and her husband about her current condition and treatment options. I explained to the patient that she has an incurable condition and all of the treatment will be of palliative nature. I discussed with the patient her treatment options including palliative care versus consideration of palliative systemic chemotherapy with  carboplatin and paclitaxel.  I would like to consider the patient for additional treatment with Keytruda with her chemotherapy but she is currently receiving palliative radiotherapy to the chest and it would be at high risk of pneumonitis if we started Keytruda at this point. The patient is interested in proceeding with systemic chemotherapy.  She would be treated with carboplatin for [facility] of 5 and paclitaxel 175 mg/M2 every 3 weeks.  I discussed with the patient the adverse effect of this treatment including but not limited to alopecia, myelosuppression, nausea and vomiting, urgency, liver or renal dysfunction. She is expected to start the first cycle of her treatment on May 06, 2017. We will arrange for the patient to have a chemotherapy education  class before the first dose of her treatment. We will also call her pharmacy with prescription for Compazine 10 mg p.o. every 6 hours as needed for nausea. The patient will come back for follow-up visit and weeks for evaluation and management of her treatment. She was advised to call immediately if she has any concerning symptoms in the interval  Disclaimer: This note was dictated with voice recognition software. Similar sounding words can inadvertently be transcribed and may be missed upon review. Eilleen Kempf, MD 04/24/17

## 2017-04-23 NOTE — Progress Notes (Signed)
START ON PATHWAY REGIMEN - Non-Small Cell Lung     A cycle is every 21 days:     Paclitaxel      Carboplatin   **Always confirm dose/schedule in your pharmacy ordering system**    Patient Characteristics: Stage IV Metastatic, Squamous, PS = 0, 1, First Line, PD-L1 Expression Positive 1-49% (TPS) / Negative / Not Tested / Not a Candidate for Immunotherapy/Awaiting Test Results AJCC T Category: T3 Current Disease Status: Distant Metastases AJCC N Category: N2 AJCC M Category: M1a AJCC 8 Stage Grouping: IVA Histology: Squamous Cell Line of therapy: First Line PD-L1 Expression Status: Quantity Not Sufficient Performance Status: PS = 0, 1 Would you be surprised if this patient died  in the next year<= I would NOT be surprised if this patient died in the next year Intent of Therapy: Non-Curative / Palliative Intent, Discussed with Patient

## 2017-04-23 NOTE — Patient Instructions (Signed)
Carboplatin injection What is this medicine? CARBOPLATIN (KAR boe pla tin) is a chemotherapy drug. It targets fast dividing cells, like cancer cells, and causes these cells to die. This medicine is used to treat ovarian cancer and many other cancers. This medicine may be used for other purposes; ask your health care provider or pharmacist if you have questions. COMMON BRAND NAME(S): Paraplatin What should I tell my health care provider before I take this medicine? They need to know if you have any of these conditions: -blood disorders -hearing problems -kidney disease -recent or ongoing radiation therapy -an unusual or allergic reaction to carboplatin, cisplatin, other chemotherapy, other medicines, foods, dyes, or preservatives -pregnant or trying to get pregnant -breast-feeding How should I use this medicine? This drug is usually given as an infusion into a vein. It is administered in a hospital or clinic by a specially trained health care professional. Talk to your pediatrician regarding the use of this medicine in children. Special care may be needed. Overdosage: If you think you have taken too much of this medicine contact a poison control center or emergency room at once. NOTE: This medicine is only for you. Do not share this medicine with others. What if I miss a dose? It is important not to miss a dose. Call your doctor or health care professional if you are unable to keep an appointment. What may interact with this medicine? -medicines for seizures -medicines to increase blood counts like filgrastim, pegfilgrastim, sargramostim -some antibiotics like amikacin, gentamicin, neomycin, streptomycin, tobramycin -vaccines Talk to your doctor or health care professional before taking any of these medicines: -acetaminophen -aspirin -ibuprofen -ketoprofen -naproxen This list may not describe all possible interactions. Give your health care provider a list of all the medicines, herbs,  non-prescription drugs, or dietary supplements you use. Also tell them if you smoke, drink alcohol, or use illegal drugs. Some items may interact with your medicine. What should I watch for while using this medicine? Your condition will be monitored carefully while you are receiving this medicine. You will need important blood work done while you are taking this medicine. This drug may make you feel generally unwell. This is not uncommon, as chemotherapy can affect healthy cells as well as cancer cells. Report any side effects. Continue your course of treatment even though you feel ill unless your doctor tells you to stop. In some cases, you may be given additional medicines to help with side effects. Follow all directions for their use. Call your doctor or health care professional for advice if you get a fever, chills or sore throat, or other symptoms of a cold or flu. Do not treat yourself. This drug decreases your body's ability to fight infections. Try to avoid being around people who are sick. This medicine may increase your risk to bruise or bleed. Call your doctor or health care professional if you notice any unusual bleeding. Be careful brushing and flossing your teeth or using a toothpick because you may get an infection or bleed more easily. If you have any dental work done, tell your dentist you are receiving this medicine. Avoid taking products that contain aspirin, acetaminophen, ibuprofen, naproxen, or ketoprofen unless instructed by your doctor. These medicines may hide a fever. Do not become pregnant while taking this medicine. Women should inform their doctor if they wish to become pregnant or think they might be pregnant. There is a potential for serious side effects to an unborn child. Talk to your health care professional or  pharmacist for more information. Do not breast-feed an infant while taking this medicine. What side effects may I notice from receiving this medicine? Side effects  that you should report to your doctor or health care professional as soon as possible: -allergic reactions like skin rash, itching or hives, swelling of the face, lips, or tongue -signs of infection - fever or chills, cough, sore throat, pain or difficulty passing urine -signs of decreased platelets or bleeding - bruising, pinpoint red spots on the skin, black, tarry stools, nosebleeds -signs of decreased red blood cells - unusually weak or tired, fainting spells, lightheadedness -breathing problems -changes in hearing -changes in vision -chest pain -high blood pressure -low blood counts - This drug may decrease the number of white blood cells, red blood cells and platelets. You may be at increased risk for infections and bleeding. -nausea and vomiting -pain, swelling, redness or irritation at the injection site -pain, tingling, numbness in the hands or feet -problems with balance, talking, walking -trouble passing urine or change in the amount of urine Side effects that usually do not require medical attention (report to your doctor or health care professional if they continue or are bothersome): -hair loss -loss of appetite -metallic taste in the mouth or changes in taste This list may not describe all possible side effects. Call your doctor for medical advice about side effects. You may report side effects to FDA at 1-800-FDA-1088. Where should I keep my medicine? This drug is given in a hospital or clinic and will not be stored at home. NOTE: This sheet is a summary. It may not cover all possible information. If you have questions about this medicine, talk to your doctor, pharmacist, or health care provider.  2018 Elsevier/Gold Standard (2007-08-19 14:38:05)  Paclitaxel injection What is this medicine? PACLITAXEL (PAK li TAX el) is a chemotherapy drug. It targets fast dividing cells, like cancer cells, and causes these cells to die. This medicine is used to treat ovarian cancer,  breast cancer, and other cancers. This medicine may be used for other purposes; ask your health care provider or pharmacist if you have questions. COMMON BRAND NAME(S): Onxol, Taxol What should I tell my health care provider before I take this medicine? They need to know if you have any of these conditions: -blood disorders -irregular heartbeat -infection (especially a virus infection such as chickenpox, cold sores, or herpes) -liver disease -previous or ongoing radiation therapy -an unusual or allergic reaction to paclitaxel, alcohol, polyoxyethylated castor oil, other chemotherapy agents, other medicines, foods, dyes, or preservatives -pregnant or trying to get pregnant -breast-feeding How should I use this medicine? This drug is given as an infusion into a vein. It is administered in a hospital or clinic by a specially trained health care professional. Talk to your pediatrician regarding the use of this medicine in children. Special care may be needed. Overdosage: If you think you have taken too much of this medicine contact a poison control center or emergency room at once. NOTE: This medicine is only for you. Do not share this medicine with others. What if I miss a dose? It is important not to miss your dose. Call your doctor or health care professional if you are unable to keep an appointment. What may interact with this medicine? Do not take this medicine with any of the following medications: -disulfiram -metronidazole This medicine may also interact with the following medications: -cyclosporine -diazepam -ketoconazole -medicines to increase blood counts like filgrastim, pegfilgrastim, sargramostim -other chemotherapy drugs like  cisplatin, doxorubicin, epirubicin, etoposide, teniposide, vincristine -quinidine -testosterone -vaccines -verapamil Talk to your doctor or health care professional before taking any of these  medicines: -acetaminophen -aspirin -ibuprofen -ketoprofen -naproxen This list may not describe all possible interactions. Give your health care provider a list of all the medicines, herbs, non-prescription drugs, or dietary supplements you use. Also tell them if you smoke, drink alcohol, or use illegal drugs. Some items may interact with your medicine. What should I watch for while using this medicine? Your condition will be monitored carefully while you are receiving this medicine. You will need important blood work done while you are taking this medicine. This medicine can cause serious allergic reactions. To reduce your risk you will need to take other medicine(s) before treatment with this medicine. If you experience allergic reactions like skin rash, itching or hives, swelling of the face, lips, or tongue, tell your doctor or health care professional right away. In some cases, you may be given additional medicines to help with side effects. Follow all directions for their use. This drug may make you feel generally unwell. This is not uncommon, as chemotherapy can affect healthy cells as well as cancer cells. Report any side effects. Continue your course of treatment even though you feel ill unless your doctor tells you to stop. Call your doctor or health care professional for advice if you get a fever, chills or sore throat, or other symptoms of a cold or flu. Do not treat yourself. This drug decreases your body's ability to fight infections. Try to avoid being around people who are sick. This medicine may increase your risk to bruise or bleed. Call your doctor or health care professional if you notice any unusual bleeding. Be careful brushing and flossing your teeth or using a toothpick because you may get an infection or bleed more easily. If you have any dental work done, tell your dentist you are receiving this medicine. Avoid taking products that contain aspirin, acetaminophen, ibuprofen,  naproxen, or ketoprofen unless instructed by your doctor. These medicines may hide a fever. Do not become pregnant while taking this medicine. Women should inform their doctor if they wish to become pregnant or think they might be pregnant. There is a potential for serious side effects to an unborn child. Talk to your health care professional or pharmacist for more information. Do not breast-feed an infant while taking this medicine. Men are advised not to father a child while receiving this medicine. This product may contain alcohol. Ask your pharmacist or healthcare provider if this medicine contains alcohol. Be sure to tell all healthcare providers you are taking this medicine. Certain medicines, like metronidazole and disulfiram, can cause an unpleasant reaction when taken with alcohol. The reaction includes flushing, headache, nausea, vomiting, sweating, and increased thirst. The reaction can last from 30 minutes to several hours. What side effects may I notice from receiving this medicine? Side effects that you should report to your doctor or health care professional as soon as possible: -allergic reactions like skin rash, itching or hives, swelling of the face, lips, or tongue -low blood counts - This drug may decrease the number of white blood cells, red blood cells and platelets. You may be at increased risk for infections and bleeding. -signs of infection - fever or chills, cough, sore throat, pain or difficulty passing urine -signs of decreased platelets or bleeding - bruising, pinpoint red spots on the skin, black, tarry stools, nosebleeds -signs of decreased red blood cells - unusually weak  or tired, fainting spells, lightheadedness -breathing problems -chest pain -high or low blood pressure -mouth sores -nausea and vomiting -pain, swelling, redness or irritation at the injection site -pain, tingling, numbness in the hands or feet -slow or irregular heartbeat -swelling of the ankle,  feet, hands Side effects that usually do not require medical attention (report to your doctor or health care professional if they continue or are bothersome): -bone pain -complete hair loss including hair on your head, underarms, pubic hair, eyebrows, and eyelashes -changes in the color of fingernails -diarrhea -loosening of the fingernails -loss of appetite -muscle or joint pain -red flush to skin -sweating This list may not describe all possible side effects. Call your doctor for medical advice about side effects. You may report side effects to FDA at 1-800-FDA-1088. Where should I keep my medicine? This drug is given in a hospital or clinic and will not be stored at home. NOTE: This sheet is a summary. It may not cover all possible information. If you have questions about this medicine, talk to your doctor, pharmacist, or health care provider.  2018 Elsevier/Gold Standard (2015-03-15 19:58:00)  Pembrolizumab injection What is this medicine? PEMBROLIZUMAB (pem broe liz ue mab) is a monoclonal antibody. It is used to treat melanoma, head and neck cancer, Hodgkin lymphoma, non-small cell lung cancer, urothelial cancer, stomach cancer, and cancers that have a certain genetic condition. This medicine may be used for other purposes; ask your health care provider or pharmacist if you have questions. COMMON BRAND NAME(S): Keytruda What should I tell my health care provider before I take this medicine? They need to know if you have any of these conditions: -diabetes -immune system problems -inflammatory bowel disease -liver disease -lung or breathing disease -lupus -organ transplant -an unusual or allergic reaction to pembrolizumab, other medicines, foods, dyes, or preservatives -pregnant or trying to get pregnant -breast-feeding How should I use this medicine? This medicine is for infusion into a vein. It is given by a health care professional in a hospital or clinic setting. A  special MedGuide will be given to you before each treatment. Be sure to read this information carefully each time. Talk to your pediatrician regarding the use of this medicine in children. While this drug may be prescribed for selected conditions, precautions do apply. Overdosage: If you think you have taken too much of this medicine contact a poison control center or emergency room at once. NOTE: This medicine is only for you. Do not share this medicine with others. What if I miss a dose? It is important not to miss your dose. Call your doctor or health care professional if you are unable to keep an appointment. What may interact with this medicine? Interactions have not been studied. Give your health care provider a list of all the medicines, herbs, non-prescription drugs, or dietary supplements you use. Also tell them if you smoke, drink alcohol, or use illegal drugs. Some items may interact with your medicine. This list may not describe all possible interactions. Give your health care provider a list of all the medicines, herbs, non-prescription drugs, or dietary supplements you use. Also tell them if you smoke, drink alcohol, or use illegal drugs. Some items may interact with your medicine. What should I watch for while using this medicine? Your condition will be monitored carefully while you are receiving this medicine. You may need blood work done while you are taking this medicine. Do not become pregnant while taking this medicine or for 4 months after  stopping it. Women should inform their doctor if they wish to become pregnant or think they might be pregnant. There is a potential for serious side effects to an unborn child. Talk to your health care professional or pharmacist for more information. Do not breast-feed an infant while taking this medicine or for 4 months after the last dose. What side effects may I notice from receiving this medicine? Side effects that you should report to your  doctor or health care professional as soon as possible: -allergic reactions like skin rash, itching or hives, swelling of the face, lips, or tongue -bloody or black, tarry -breathing problems -changes in vision -chest pain -chills -constipation -cough -dizziness or feeling faint or lightheaded -fast or irregular heartbeat -fever -flushing -hair loss -low blood counts - this medicine may decrease the number of white blood cells, red blood cells and platelets. You may be at increased risk for infections and bleeding. -muscle pain -muscle weakness -persistent headache -signs and symptoms of high blood sugar such as dizziness; dry mouth; dry skin; fruity breath; nausea; stomach pain; increased hunger or thirst; increased urination -signs and symptoms of kidney injury like trouble passing urine or change in the amount of urine -signs and symptoms of liver injury like dark urine, light-colored stools, loss of appetite, nausea, right upper belly pain, yellowing of the eyes or skin -stomach pain -sweating -weight loss Side effects that usually do not require medical attention (report to your doctor or health care professional if they continue or are bothersome): -decreased appetite -diarrhea -tiredness This list may not describe all possible side effects. Call your doctor for medical advice about side effects. You may report side effects to FDA at 1-800-FDA-1088. Where should I keep my medicine? This drug is given in a hospital or clinic and will not be stored at home. NOTE: This sheet is a summary. It may not cover all possible information. If you have questions about this medicine, talk to your doctor, pharmacist, or health care provider.  2018 Elsevier/Gold Standard (2016-02-21 12:29:36)

## 2017-04-23 NOTE — Telephone Encounter (Signed)
Gave avs and calendar for November - February 2019

## 2017-04-23 NOTE — Assessment & Plan Note (Signed)
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  The patient was seen with Dr. Julien Nordmann.  Guardant 360 results were discussed with the patient and her husband.   We discussed that there are no mutations that we can use targeted therapy for.  Recommend that she continue on radiation.  We discussed beginning systemic chemotherapy with carboplatin for an AUC of 5 and paclitaxel 175 mg/m. Discussed with the patient adverse effects of this treatment including but not limited to alopecia, myelosuppression, nausea and vomiting, peripheral neuropathy, liver or renal dysfunction.  We also discussed adding immunotherapy with Keytruda after her radiation is complete. Adverse effects of this treatment were discussed including but not limited to immune mediated the skin rash, diarrhea, inflammation of the lung, kidney, liver, thyroid or other endocrine dysfunction.  The patient would like to proceed with chemotherapy.  The plan is to begin with carboplatin and Taxol initially and to add Keytruda after her radiation is complete.  The patient will have a chemotherapy education class.  A prescription for Compazine 10 mg every 6 hours as needed for nausea and vomiting was sent to her local pharmacy.  The patient will have weekly labs while she is receiving chemotherapy.  Anticipate first dose on 05/06/2017.  She will be seen for follow-up for evaluation prior to cycle 1 on that date.  The patient was advised to call immediately if she has any concerning symptoms in the interval. The patient voices understanding of current disease status and treatment options and is in agreement with the current care plan.  All questions were answered. The patient knows to call the clinic with any  problems, questions or concerns. We can certainly see the patient much sooner if necessary.

## 2017-04-24 ENCOUNTER — Ambulatory Visit
Admission: RE | Admit: 2017-04-24 | Discharge: 2017-04-24 | Disposition: A | Discharge status: Home / Self Care | Payer: Medicare Other | Source: Ambulatory Visit | Attending: Radiation Oncology | Admitting: Radiation Oncology

## 2017-04-24 ENCOUNTER — Ambulatory Visit (HOSPITAL_COMMUNITY)
Admission: RE | Admit: 2017-04-24 | Discharge: 2017-04-24 | Disposition: A | Discharge status: Home / Self Care | Payer: Medicare Other | Source: Ambulatory Visit | Attending: Radiation Oncology | Admitting: Radiation Oncology

## 2017-04-24 ENCOUNTER — Encounter: Payer: Self-pay | Admitting: Oncology

## 2017-04-24 ENCOUNTER — Ambulatory Visit (HOSPITAL_COMMUNITY)
Admission: RE | Admit: 2017-04-24 | Discharge: 2017-04-24 | Disposition: A | Discharge status: Home / Self Care | Payer: Medicare Other | Source: Ambulatory Visit | Attending: Radiology | Admitting: Radiology

## 2017-04-24 DIAGNOSIS — J90 Pleural effusion, not elsewhere classified: Secondary | ICD-10-CM | POA: Insufficient documentation

## 2017-04-24 DIAGNOSIS — Z9889 Other specified postprocedural states: Secondary | ICD-10-CM

## 2017-04-24 DIAGNOSIS — C3411 Malignant neoplasm of upper lobe, right bronchus or lung: Secondary | ICD-10-CM | POA: Insufficient documentation

## 2017-04-24 MED ORDER — LIDOCAINE HCL 2 % IJ SOLN
INTRAMUSCULAR | Status: AC
Start: 2017-04-24 — End: 2017-04-24
  Filled 2017-04-24: qty 10

## 2017-04-24 NOTE — Procedures (Signed)
Ultrasound-guided diagnostic and therapeutic right thoracentesis performed yielding 425 cc of slightly hazy, yellow fluid. No immediate complications. Follow-up chest x-ray pending. The fluid was sent to the lab for cytology.

## 2017-04-25 ENCOUNTER — Encounter: Payer: Self-pay | Admitting: *Deleted

## 2017-04-25 ENCOUNTER — Other Ambulatory Visit: Payer: Medicare Other

## 2017-04-25 ENCOUNTER — Ambulatory Visit
Admission: RE | Admit: 2017-04-25 | Discharge: 2017-04-25 | Disposition: A | Discharge status: Home / Self Care | Payer: Medicare Other | Source: Ambulatory Visit | Attending: Radiation Oncology | Admitting: Radiation Oncology

## 2017-04-25 ENCOUNTER — Telehealth: Payer: Self-pay | Admitting: Radiation Oncology

## 2017-04-25 DIAGNOSIS — Z981 Arthrodesis status: Secondary | ICD-10-CM | POA: Diagnosis not present

## 2017-04-25 DIAGNOSIS — Z833 Family history of diabetes mellitus: Secondary | ICD-10-CM | POA: Diagnosis not present

## 2017-04-25 DIAGNOSIS — Z79899 Other long term (current) drug therapy: Secondary | ICD-10-CM | POA: Diagnosis not present

## 2017-04-25 DIAGNOSIS — E119 Type 2 diabetes mellitus without complications: Secondary | ICD-10-CM | POA: Diagnosis not present

## 2017-04-25 DIAGNOSIS — Z880 Allergy status to penicillin: Secondary | ICD-10-CM | POA: Diagnosis not present

## 2017-04-25 DIAGNOSIS — Z8701 Personal history of pneumonia (recurrent): Secondary | ICD-10-CM | POA: Diagnosis not present

## 2017-04-25 DIAGNOSIS — Z881 Allergy status to other antibiotic agents status: Secondary | ICD-10-CM | POA: Diagnosis not present

## 2017-04-25 DIAGNOSIS — C3411 Malignant neoplasm of upper lobe, right bronchus or lung: Secondary | ICD-10-CM | POA: Diagnosis not present

## 2017-04-25 DIAGNOSIS — Z87891 Personal history of nicotine dependence: Secondary | ICD-10-CM | POA: Diagnosis not present

## 2017-04-25 DIAGNOSIS — Z820 Family history of epilepsy and other diseases of the nervous system: Secondary | ICD-10-CM | POA: Diagnosis not present

## 2017-04-25 DIAGNOSIS — Z803 Family history of malignant neoplasm of breast: Secondary | ICD-10-CM | POA: Diagnosis not present

## 2017-04-25 DIAGNOSIS — F329 Major depressive disorder, single episode, unspecified: Secondary | ICD-10-CM | POA: Diagnosis not present

## 2017-04-25 DIAGNOSIS — J449 Chronic obstructive pulmonary disease, unspecified: Secondary | ICD-10-CM | POA: Diagnosis not present

## 2017-04-25 DIAGNOSIS — M1990 Unspecified osteoarthritis, unspecified site: Secondary | ICD-10-CM | POA: Diagnosis not present

## 2017-04-25 DIAGNOSIS — I10 Essential (primary) hypertension: Secondary | ICD-10-CM | POA: Diagnosis not present

## 2017-04-25 DIAGNOSIS — Z7982 Long term (current) use of aspirin: Secondary | ICD-10-CM | POA: Diagnosis not present

## 2017-04-25 DIAGNOSIS — Z510 Encounter for antineoplastic radiation therapy: Secondary | ICD-10-CM | POA: Diagnosis present

## 2017-04-25 DIAGNOSIS — E785 Hyperlipidemia, unspecified: Secondary | ICD-10-CM | POA: Diagnosis not present

## 2017-04-25 NOTE — Telephone Encounter (Signed)
I spoke with patient to let her know the findings from her thoracentesis cytology.  Thankfully no malignant cells were present, and she has follow-up with Dr. Debara Pickett in cardiology in a week and a half, I encouraged her to call back should he develop any acute onset of shortness of breath.

## 2017-04-26 ENCOUNTER — Ambulatory Visit
Admission: RE | Admit: 2017-04-26 | Discharge: 2017-04-26 | Disposition: A | Discharge status: Home / Self Care | Payer: Medicare Other | Source: Ambulatory Visit | Attending: Radiation Oncology | Admitting: Radiation Oncology

## 2017-04-26 DIAGNOSIS — Z510 Encounter for antineoplastic radiation therapy: Secondary | ICD-10-CM | POA: Diagnosis not present

## 2017-04-29 ENCOUNTER — Ambulatory Visit
Admission: RE | Admit: 2017-04-29 | Discharge: 2017-04-29 | Disposition: A | Discharge status: Home / Self Care | Payer: Medicare Other | Source: Ambulatory Visit | Attending: Radiation Oncology | Admitting: Radiation Oncology

## 2017-04-29 DIAGNOSIS — Z981 Arthrodesis status: Secondary | ICD-10-CM | POA: Diagnosis not present

## 2017-04-29 DIAGNOSIS — Z7982 Long term (current) use of aspirin: Secondary | ICD-10-CM | POA: Diagnosis not present

## 2017-04-29 DIAGNOSIS — Z87891 Personal history of nicotine dependence: Secondary | ICD-10-CM | POA: Diagnosis not present

## 2017-04-29 DIAGNOSIS — I10 Essential (primary) hypertension: Secondary | ICD-10-CM | POA: Diagnosis not present

## 2017-04-29 DIAGNOSIS — Z880 Allergy status to penicillin: Secondary | ICD-10-CM | POA: Diagnosis not present

## 2017-04-29 DIAGNOSIS — Z79899 Other long term (current) drug therapy: Secondary | ICD-10-CM | POA: Diagnosis not present

## 2017-04-29 DIAGNOSIS — Z803 Family history of malignant neoplasm of breast: Secondary | ICD-10-CM | POA: Diagnosis not present

## 2017-04-29 DIAGNOSIS — F329 Major depressive disorder, single episode, unspecified: Secondary | ICD-10-CM | POA: Diagnosis not present

## 2017-04-29 DIAGNOSIS — Z881 Allergy status to other antibiotic agents status: Secondary | ICD-10-CM | POA: Diagnosis not present

## 2017-04-29 DIAGNOSIS — Z820 Family history of epilepsy and other diseases of the nervous system: Secondary | ICD-10-CM | POA: Diagnosis not present

## 2017-04-29 DIAGNOSIS — M1990 Unspecified osteoarthritis, unspecified site: Secondary | ICD-10-CM | POA: Diagnosis not present

## 2017-04-29 DIAGNOSIS — Z833 Family history of diabetes mellitus: Secondary | ICD-10-CM | POA: Diagnosis not present

## 2017-04-29 DIAGNOSIS — E785 Hyperlipidemia, unspecified: Secondary | ICD-10-CM | POA: Diagnosis not present

## 2017-04-29 DIAGNOSIS — J449 Chronic obstructive pulmonary disease, unspecified: Secondary | ICD-10-CM | POA: Diagnosis not present

## 2017-04-29 DIAGNOSIS — Z8701 Personal history of pneumonia (recurrent): Secondary | ICD-10-CM | POA: Diagnosis not present

## 2017-04-29 DIAGNOSIS — Z510 Encounter for antineoplastic radiation therapy: Secondary | ICD-10-CM | POA: Diagnosis not present

## 2017-04-29 DIAGNOSIS — E119 Type 2 diabetes mellitus without complications: Secondary | ICD-10-CM | POA: Diagnosis not present

## 2017-04-29 DIAGNOSIS — C3411 Malignant neoplasm of upper lobe, right bronchus or lung: Secondary | ICD-10-CM | POA: Diagnosis not present

## 2017-04-30 ENCOUNTER — Ambulatory Visit
Admission: RE | Admit: 2017-04-30 | Discharge: 2017-04-30 | Disposition: A | Discharge status: Home / Self Care | Payer: Medicare Other | Source: Ambulatory Visit | Attending: Radiation Oncology | Admitting: Radiation Oncology

## 2017-04-30 DIAGNOSIS — Z510 Encounter for antineoplastic radiation therapy: Secondary | ICD-10-CM | POA: Diagnosis not present

## 2017-05-01 ENCOUNTER — Ambulatory Visit
Admission: RE | Admit: 2017-05-01 | Discharge: 2017-05-01 | Disposition: A | Discharge status: Home / Self Care | Payer: Medicare Other | Source: Ambulatory Visit | Attending: Radiation Oncology | Admitting: Radiation Oncology

## 2017-05-01 DIAGNOSIS — Z510 Encounter for antineoplastic radiation therapy: Secondary | ICD-10-CM | POA: Diagnosis not present

## 2017-05-02 ENCOUNTER — Encounter: Payer: Self-pay | Admitting: Radiation Oncology

## 2017-05-02 ENCOUNTER — Ambulatory Visit
Admission: RE | Admit: 2017-05-02 | Discharge: 2017-05-02 | Disposition: A | Discharge status: Home / Self Care | Payer: Medicare Other | Source: Ambulatory Visit | Attending: Radiation Oncology | Admitting: Radiation Oncology

## 2017-05-02 DIAGNOSIS — Z510 Encounter for antineoplastic radiation therapy: Secondary | ICD-10-CM | POA: Diagnosis not present

## 2017-05-02 LAB — GUARDANT 360

## 2017-05-03 ENCOUNTER — Ambulatory Visit
Admission: RE | Admit: 2017-05-03 | Discharge: 2017-05-03 | Disposition: A | Discharge status: Home / Self Care | Payer: Medicare Other | Source: Ambulatory Visit | Attending: Radiation Oncology | Admitting: Radiation Oncology

## 2017-05-03 DIAGNOSIS — Z510 Encounter for antineoplastic radiation therapy: Secondary | ICD-10-CM | POA: Diagnosis not present

## 2017-05-05 ENCOUNTER — Telehealth: Payer: Self-pay | Admitting: Internal Medicine

## 2017-05-05 NOTE — Telephone Encounter (Signed)
Spoke with patient regarding center closing 12/10 and being contacted to reschedule her appointments.  Patient reported having broke out in a rash last week that is not getting any better. Per patient she was instructed by Dr. Lisbeth Renshaw to use cortisone cream, however the rash is not getting better. Patient informed this message has been routed to Dr. Julien Nordmann, as well as Dr. Lisbeth Renshaw.

## 2017-05-06 ENCOUNTER — Ambulatory Visit: Payer: Medicare Other

## 2017-05-06 ENCOUNTER — Other Ambulatory Visit: Payer: Medicare Other

## 2017-05-06 ENCOUNTER — Ambulatory Visit: Payer: Medicare Other | Admitting: Oncology

## 2017-05-07 ENCOUNTER — Other Ambulatory Visit: Payer: Self-pay | Admitting: Radiation Oncology

## 2017-05-07 ENCOUNTER — Ambulatory Visit
Admission: RE | Admit: 2017-05-07 | Discharge: 2017-05-07 | Disposition: A | Discharge status: Home / Self Care | Payer: Medicare Other | Source: Ambulatory Visit | Attending: Radiation Oncology | Admitting: Radiation Oncology

## 2017-05-07 ENCOUNTER — Telehealth: Payer: Self-pay | Admitting: *Deleted

## 2017-05-07 DIAGNOSIS — Z510 Encounter for antineoplastic radiation therapy: Secondary | ICD-10-CM | POA: Diagnosis not present

## 2017-05-07 MED ORDER — METHYLPREDNISOLONE 4 MG PO TBPK: ORAL_TABLET | ORAL | 0 refills | Status: DC

## 2017-05-07 MED ORDER — GLIPIZIDE 5 MG PO TABS: 5.0000 mg | ORAL_TABLET | Freq: Every day | ORAL | Status: AC

## 2017-05-07 NOTE — Telephone Encounter (Signed)
TCT patient regarding appt scheduled tomorrow for Neulasta. That appt is being cancelled as patient did not receive chemo yesterday d/t closing of cancer center d/t weather. Pt voiced understanding. She was able to make to radiation treatment today. She also states that she developed a rash last week. This was addressed by Shona Simpson, PA in [facility].  She received a prescription for Medrol dospak for rash and Glucotrol for potential spike in blood sugar. Advised pt that we would be in contact with her regaring appt changes for her chemo.  Pt voiced understanding.

## 2017-05-08 ENCOUNTER — Ambulatory Visit: Payer: Medicare Other

## 2017-05-08 ENCOUNTER — Telehealth: Payer: Self-pay | Admitting: Radiation Oncology

## 2017-05-08 ENCOUNTER — Ambulatory Visit
Admission: RE | Admit: 2017-05-08 | Discharge: 2017-05-08 | Disposition: A | Discharge status: Home / Self Care | Payer: Medicare Other | Source: Ambulatory Visit | Attending: Radiation Oncology | Admitting: Radiation Oncology

## 2017-05-08 DIAGNOSIS — Z510 Encounter for antineoplastic radiation therapy: Secondary | ICD-10-CM | POA: Diagnosis not present

## 2017-05-08 NOTE — Telephone Encounter (Signed)
I received a call from the patient today. She was seen yesterday briefly and was found to have progression in the cutaneous rash she noticed over the last two weeks. She was counseled the week prior on topical steroids, antihistamines, and refused a medrol dosepak. She has had prior issues not long ago when she was given dexamethasone to improve pulmonary function, which unfortunately caused her glucose levels to rise up into the 600 range. She also went into a fib with RVR and was hospitalized. She stabilized following this but has been chronically ill in the midst of treatment for advanced lung cancer causing SVC syndrome. She has about a week and a half left of radiation treatment and was to start chemotherapy today, but had to delay this due to the weather. When I saw her yesterday she had progressive rash that appeared to be a contact dermatitis. The source still is yet to be determined but could be due to contact versus doxycycline she also received for a pneumonia. She agreed to a medrol dosepak with the understanding of taking her Glipizide, 10 U of Lantus at HS, and adding back Humalog 3 U TID if her morning blood sugar was greater than 140.   Today her fasting blood sugar was 180, and she did not take her Humalog. She did take her Glipizide and her pm dose of Lantus yesterday evening. She had cornflakes and a pack of peanut butter crackers, and her glucose was 523. She called the office and I instructed her to take 10 units of Humalog, and to recheck her blood sugar in one hour. She was also educated on trying to avoid carbohydrate rich diet while on her steroids, and as well due to her diabetes. I also called her PCP Dr. [name] at [facility]. She recommends increasing her night time dose of Lantus to 20 U q HS, resuming her Humalog 3 U TID with meals, continuing Glipizide, and taking 10 units of Humalog prn if her blood sugar goes over 200, repeating, and contacting provider for  additional sliding scale recommendations.    After 1 hour post 10 U Humalog, she noticed her glucose at 140. I reviewed these instructions above with her and she is in agreement. We will follow expectantly, and she will see Dr. Sabra Heck next Monday.

## 2017-05-09 ENCOUNTER — Encounter: Payer: Self-pay | Admitting: Internal Medicine

## 2017-05-09 ENCOUNTER — Ambulatory Visit
Admission: RE | Admit: 2017-05-09 | Discharge: 2017-05-09 | Disposition: A | Discharge status: Home / Self Care | Payer: Medicare Other | Source: Ambulatory Visit | Attending: Radiation Oncology | Admitting: Radiation Oncology

## 2017-05-09 ENCOUNTER — Ambulatory Visit: Payer: Medicare Other | Admitting: Internal Medicine

## 2017-05-09 VITALS — BP 134/76 | HR 87 | Ht 60.0 in | Wt 128.0 lb

## 2017-05-09 DIAGNOSIS — J449 Chronic obstructive pulmonary disease, unspecified: Secondary | ICD-10-CM

## 2017-05-09 DIAGNOSIS — R918 Other nonspecific abnormal finding of lung field: Secondary | ICD-10-CM

## 2017-05-09 DIAGNOSIS — I10 Essential (primary) hypertension: Secondary | ICD-10-CM | POA: Diagnosis not present

## 2017-05-09 DIAGNOSIS — Z510 Encounter for antineoplastic radiation therapy: Secondary | ICD-10-CM | POA: Diagnosis not present

## 2017-05-09 DIAGNOSIS — I480 Paroxysmal atrial fibrillation: Secondary | ICD-10-CM

## 2017-05-09 MED ORDER — APIXABAN 5 MG PO TABS: ORAL_TABLET | ORAL | 5 refills | Status: AC

## 2017-05-09 MED ORDER — DILTIAZEM HCL ER COATED BEADS 360 MG PO CP24: 360.0000 mg | ORAL_CAPSULE | Freq: Every day | ORAL | 5 refills | Status: AC

## 2017-05-09 NOTE — Progress Notes (Signed)
OFFICE NOTE  Chief Complaint:  Hospital follow-up  Primary Care Physician: [name], MD  HPI:  [name] is a 71 y.o. female with a past medial history significant for COPD and recent diagnosis of lung cancer.  She starts chemotherapy tomorrow.  She was recently hospitalized with OPD exacerbation and A. fib with RVR.  She was seen in consultation by me and recommended continuing diltiazem after she converted on a diltiazem drip.  Her CHADSVASC score is 4, and therefore Eliquis was recommended.  She was hesitant to start this however prior to discharge she was found to have a new upper extremity DVT.  She was placed on Eliquis 10 mg twice daily for 7 days with transition to 5 mg twice a day.  She says she is completed all of her medication and is in need of a prescription today.  She denies any further palpitations.  EKG shows sinus rhythm today.  Echo in the hospital showed LVEF 65-70% with normal wall thickness and normal left atrial size.  PMHx:  Past Medical History:  Diagnosis Date  . Arthritis   . Asthma   . COPD (chronic obstructive pulmonary disease) ([place])   . Depression   . Diabetes mellitus    diet controlled  . History of hiatal hernia   . Hyperlipemia   . Hypertension   . Pneumonia     Past Surgical History:  Procedure Laterality Date  . CARDIAC CATHETERIZATION  1999  . CARPAL TUNNEL RELEASE  2/13   right-GSC  . CARPAL TUNNEL RELEASE  08/15/2011   Procedure: CARPAL TUNNEL RELEASE;  Surgeon: Linna Hoff, MD;  Location: [facility];  Service: Orthopedics;  Laterality: Left;  . CATARACT EXTRACTION    . CERVICAL FUSION  2002  . COLONOSCOPY    . ENDOBRONCHIAL ULTRASOUND Bilateral 03/18/2017   Procedure: ENDOBRONCHIAL ULTRASOUND;  Surgeon: Javier Glazier, MD;  Location: [facility];  Service: Cardiopulmonary;  Laterality: Bilateral;  . TONSILLECTOMY    . VIDEO BRONCHOSCOPY Bilateral 02/25/2017   Procedure: VIDEO BRONCHOSCOPY WITHOUT FLUORO;   Surgeon: Javier Glazier, MD;  Location: [facility];  Service: Cardiopulmonary;  Laterality: Bilateral;    FAMHx:  Family History  Problem Relation Age of Onset  . Diabetes Mother   . Colon cancer Father   . Cerebral palsy Brother   . Breast cancer Paternal Aunt   . Breast cancer Cousin   . Prostate cancer Paternal Uncle   . Bone cancer Maternal Aunt     SOCHx:   reports that she quit smoking about 2 months ago. She started smoking about 54 years ago. She has a 53.00 pack-year smoking history. she has never used smokeless tobacco. She reports that she does not drink alcohol or use drugs.  ALLERGIES:  Allergies  Allergen Reactions  . Penicillins Hives    Has patient had a PCN reaction causing immediate rash, facial/tongue/throat swelling, SOB or lightheadedness with hypotension: yes Has patient had a PCN reaction causing severe rash involving mucus membranes or skin necrosis: no Has patient had a PCN reaction that required hospitalization: yes Has patient had a PCN reaction occurring within the last 10 years: no If all of the above answers are "NO", then may proceed with Cephalosporin use.   . Tiotropium Bromide Monohydrate Other (See Comments)    EYE PAIN KIDNEY FUNCTION SLOWED DOWN    ROS: Pertinent items noted in HPI and remainder of comprehensive ROS otherwise negative.  HOME MEDS: Current Outpatient Medications on  File Prior to Visit  Medication Sig Dispense Refill  . acetaminophen (TYLENOL) 500 MG tablet Take 500 mg by mouth every 8 (eight) hours as needed for mild pain or headache.    Marland Kitchen apixaban (ELIQUIS) 5 MG TABS tablet Take 10 mg twice daily for 7 days then 5 mg twice daily (please follow up with pcp for further instructions) 60 tablet 0  . blood glucose meter kit and supplies KIT Dispense based on patient and insurance preference. Use up to four times daily as directed. (FOR ICD-9 250.00, 250.01). 1 each 0  . citalopram (CELEXA) 20 MG tablet Take 20 mg daily  by mouth.     . diltiazem (CARDIZEM CD) 360 MG 24 hr capsule Take 1 capsule (360 mg total) daily by mouth. 30 capsule 0  . glipiZIDE (GLUCOTROL) 5 MG tablet Take 1 tablet (5 mg total) by mouth daily before breakfast.    . Glycopyrrolate-Formoterol (BEVESPI AEROSPHERE) 9-4.8 MCG/ACT AERO Inhale 2 puffs 2 (two) times daily into the lungs. 1 Inhaler 0  . Guaifenesin (MUCINEX MAXIMUM STRENGTH) 1200 MG TB12 Take 1,200 mg by mouth 2 (two) times daily.    . insulin glargine (LANTUS) 100 UNIT/ML injection Inject 0.1 mLs (10 Units total) at bedtime into the skin. 10 mL 11  . ipratropium-albuterol (DUONEB) 0.5-2.5 (3) MG/3ML SOLN Take 3 mLs by nebulization every 6 (six) hours as needed. (Patient taking differently: Take 3 mLs by nebulization every 6 (six) hours as needed (shortness of breath). ) 120 mL 3  . losartan (COZAAR) 50 MG tablet Take 50 mg by mouth daily.  0  . methylPREDNISolone (MEDROL DOSEPAK) 4 MG TBPK tablet 6 tabs po day 1, 5 tabs po day 2, 4 tabs po day 3, 3 tabs po day 4, 2 tabs po day 5, 1 tab po day 6 and then stop 21 tablet 0  . non-metallic deodorant (ALRA) MISC Apply 1 application topically.    . nystatin (MYCOSTATIN) 100000 UNIT/ML suspension Take 5 mLs (500,000 Units total) 3 (three) times daily by mouth. Make sure to swish and swallow. 150 mL 1  . polyethylene glycol (MIRALAX / GLYCOLAX) packet Take 17 g by mouth daily. (Patient taking differently: Take 17 g by mouth daily as needed for mild constipation. ) 14 each 0  . Respiratory Therapy Supplies (FLUTTER) DEVI 1 Device as needed by Does not apply route. 1 each 0  . simvastatin (ZOCOR) 40 MG tablet Take 40 mg by mouth every evening.    Marland Kitchen Spacer/Aero Chamber Mouthpiece MISC 1 Device by Does not apply route as directed. 1 each 0  . Wound Dressings (SONAFINE) Apply 1 application topically 2 (two) times daily. Apply after rad txs and at bedtimes daily,nothing 4 hours prior to rad tx     No current facility-administered medications on  file prior to visit.     LABS/IMAGING: No results found for this or any previous visit (from the past 48 hour(s)). No results found.  LIPID PANEL: No results found for: CHOL, TRIG, HDL, CHOLHDL, VLDL, LDLCALC, LDLDIRECT   WEIGHTS: Wt Readings from Last 3 Encounters:  05/09/17 128 lb (58.1 kg)  04/23/17 126 lb 11.2 oz (57.5 kg)  04/22/17 127 lb 12.8 oz (58 kg)    VITALS: BP 134/76   Pulse 87   Ht 5' (1.524 m)   Wt 128 lb (58.1 kg)   SpO2 90%   BMI 25.00 kg/m   EXAM: General appearance: alert and no distress Neck: no carotid bruit, no JVD and  thyroid not enlarged, symmetric, no tenderness/mass/nodules Lungs: diminished breath sounds bilaterally Heart: regular rate and rhythm Abdomen: soft, non-tender; bowel sounds normal; no masses,  no organomegaly Extremities: extremities normal, atraumatic, no cyanosis or edema and Kerlix bandaging over both forearms Pulses: 2+ and symmetric Skin: Skin color, texture, turgor normal. No rashes or lesions Neurologic: Grossly normal Psych: Pleasant  EKG: Normal sinus rhythm at 84, IVCD- personally reviewed  ASSESSMENT: 1. Paroxysmal atrial fibrillation- CHADSVASC score of 4 2. Recent diagnosis of lung cancer 3. COPD 4. Hypertension 5. 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   [name] was recently hospitalized with PAF and spontaneously converted to sinus rhythm on diltiazem.  She has a CHADSVASC score of 4 and therefore should be anticoagulated.  She was also noted to have sluggish flow throughout the bilateral jugular subclavian and axillary veins, possibly related to bulky lung cancer.  She is undergoing treatment at this time but is at increased risk for thrombus due to a hypercoagulable state.  I recommend that she continue on anticoagulation with Eliquis 5 mg twice daily.  We will provide samples and a prescription today.  She should continue diltiazem for rate control.  Plan to see her back in 3  months for follow-up.  Pixie Casino, MD, [facility], [place] Director of the [facility] &  Card[facility] Attending Cardiologist  Direct Dial: [phone]  Fax: [phone]  Website:  [name] 05/09/2017, 9:01 AM

## 2017-05-09 NOTE — Patient Instructions (Addendum)
Your physician recommends that you continue on your current medications as directed. Please refer to the Current Medication list given to you today.  Dr. Debara Pickett recommends ELIQUIS 5mg  twice daily for stroke prevention - 3 boxes of samples provided  Your physician recommends that you schedule a follow-up appointment in [place] with Dr. Debara Pickett.

## 2017-05-10 ENCOUNTER — Encounter: Payer: Self-pay | Admitting: Internal Medicine

## 2017-05-10 ENCOUNTER — Ambulatory Visit: Payer: Medicare Other

## 2017-05-10 ENCOUNTER — Ambulatory Visit
Admission: RE | Admit: 2017-05-10 | Discharge: 2017-05-10 | Disposition: A | Discharge status: Home / Self Care | Payer: Medicare Other | Source: Ambulatory Visit | Attending: Radiation Oncology | Admitting: Radiation Oncology

## 2017-05-10 ENCOUNTER — Other Ambulatory Visit: Payer: Medicare Other

## 2017-05-10 ENCOUNTER — Ambulatory Visit (HOSPITAL_BASED_OUTPATIENT_CLINIC_OR_DEPARTMENT_OTHER): Payer: Medicare Other | Admitting: Internal Medicine

## 2017-05-10 ENCOUNTER — Ambulatory Visit: Payer: Medicare Other | Admitting: Internal Medicine

## 2017-05-10 ENCOUNTER — Other Ambulatory Visit (HOSPITAL_BASED_OUTPATIENT_CLINIC_OR_DEPARTMENT_OTHER): Payer: Medicare Other

## 2017-05-10 VITALS — BP 138/78 | HR 90 | Temp 95.1°F | Resp 20 | Ht 60.0 in | Wt 128.2 lb

## 2017-05-10 DIAGNOSIS — Z5111 Encounter for antineoplastic chemotherapy: Secondary | ICD-10-CM

## 2017-05-10 DIAGNOSIS — R531 Weakness: Secondary | ICD-10-CM

## 2017-05-10 DIAGNOSIS — C3411 Malignant neoplasm of upper lobe, right bronchus or lung: Secondary | ICD-10-CM

## 2017-05-10 DIAGNOSIS — M7989 Other specified soft tissue disorders: Secondary | ICD-10-CM

## 2017-05-10 DIAGNOSIS — R5383 Other fatigue: Secondary | ICD-10-CM | POA: Diagnosis not present

## 2017-05-10 DIAGNOSIS — R05 Cough: Secondary | ICD-10-CM

## 2017-05-10 DIAGNOSIS — I871 Compression of vein: Secondary | ICD-10-CM | POA: Diagnosis not present

## 2017-05-10 DIAGNOSIS — Z510 Encounter for antineoplastic radiation therapy: Secondary | ICD-10-CM | POA: Diagnosis not present

## 2017-05-10 DIAGNOSIS — Z7901 Long term (current) use of anticoagulants: Secondary | ICD-10-CM

## 2017-05-10 LAB — CBC WITH DIFFERENTIAL/PLATELET
BASO%: 0.3 % (ref 0.0–2.0)
BASOS ABS: 0 10*3/uL (ref 0.0–0.1)
EOS ABS: 0.6 10*3/uL — AB (ref 0.0–0.5)
EOS%: 4 % (ref 0.0–7.0)
HEMATOCRIT: 35.6 % (ref 34.8–46.6)
HEMOGLOBIN: 11.4 g/dL — AB (ref 11.6–15.9)
LYMPH#: 1.8 10*3/uL (ref 0.9–3.3)
LYMPH%: 11.5 % — ABNORMAL LOW (ref 14.0–49.7)
MCH: 31.8 pg (ref 25.1–34.0)
MCHC: 32 g/dL (ref 31.5–36.0)
MCV: 99.4 fL (ref 79.5–101.0)
MONO#: 0.4 10*3/uL (ref 0.1–0.9)
MONO%: 2.3 % (ref 0.0–14.0)
NEUT#: 12.7 10*3/uL — ABNORMAL HIGH (ref 1.5–6.5)
NEUT%: 81.9 % — ABNORMAL HIGH (ref 38.4–76.8)
Platelets: 339 10*3/uL (ref 145–400)
RBC: 3.58 10*6/uL — ABNORMAL LOW (ref 3.70–5.45)
RDW: 17.7 % — AB (ref 11.2–14.5)
WBC: 15.5 10*3/uL — ABNORMAL HIGH (ref 3.9–10.3)

## 2017-05-10 LAB — COMPREHENSIVE METABOLIC PANEL
ALBUMIN: 2.7 g/dL — AB (ref 3.5–5.0)
ALK PHOS: 57 U/L (ref 40–150)
ALT: 15 U/L (ref 0–55)
AST: 9 U/L (ref 5–34)
Anion Gap: 10 mEq/L (ref 3–11)
BUN: 22.6 mg/dL (ref 7.0–26.0)
CALCIUM: 8.8 mg/dL (ref 8.4–10.4)
CO2: 27 mEq/L (ref 22–29)
Chloride: 96 mEq/L — ABNORMAL LOW (ref 98–109)
Creatinine: 0.6 mg/dL (ref 0.6–1.1)
Glucose: 105 mg/dl (ref 70–140)
POTASSIUM: 4.2 meq/L (ref 3.5–5.1)
Sodium: 133 mEq/L — ABNORMAL LOW (ref 136–145)
Total Bilirubin: 0.28 mg/dL (ref 0.20–1.20)
Total Protein: 5.5 g/dL — ABNORMAL LOW (ref 6.4–8.3)

## 2017-05-10 LAB — TECHNOLOGIST REVIEW

## 2017-05-10 NOTE — Progress Notes (Signed)
[facility] Telephone:([phone]) [phone]   Fax:(336) [phone]  OFFICE PROGRESS NOTE  [name], MD [place] [address]  DIAGNOSIS: Stage IV non-small cell lung cancer,poorly differentiated carcinoma,presented with right upper lobe lung mass in addition to right hilar and mediastinal lymphadenopathy as well as necrotic left upper lobe nodule and suspicious right adrenal mass.  PRIOR THERAPY: Palliative radiotherapy to the right upper lobe lung mass under the care of Dr. Lisbeth Renshaw.  CURRENT THERAPY: Systemic chemotherapy with carboplatin for AC of 5 and paclitaxel 175 mg/M2 every 3 weeks.  The patient is expected to start this treatment in 2 weeks.  INTERVAL HISTORY: [name] 71 y.o. female returns to the clinic today for follow-up visit accompanied by her husband.  The patient is currently undergoing palliative radiotherapy to the right upper lobe lung mass.  She continues to have a swelling of the upper extremities as well as shortness of breasts at baseline increased with exertion.  Her oxygen saturation at home has been over 90 but after walking to the office today her oxygen saturation was down to 81% increased to 86% on 2 L nasal cannula.  She also continues have increasing fatigue and weakness.  She was supposed to start the first cycle of her treatment today.  She denied having any chest pain but continues to have mild cough with no hemoptysis.  She denied having any fever or chills.  She has no nausea, vomiting, diarrhea or constipation.  She lost a few pounds recently.  The patient is here today for evaluation before starting the first cycle of chemotherapy.  MEDICAL HISTORY: Past Medical History:  Diagnosis Date  . Arthritis   . Asthma   . COPD (chronic obstructive pulmonary disease) ([place])   . Depression   . Diabetes mellitus    diet controlled  . History of hiatal hernia   . Hyperlipemia   . Hypertension   . Pneumonia     ALLERGIES:   is allergic to penicillins and tiotropium bromide monohydrate.  MEDICATIONS:  Current Outpatient Medications  Medication Sig Dispense Refill  . acetaminophen (TYLENOL) 500 MG tablet Take 500 mg by mouth every 8 (eight) hours as needed for mild pain or headache.    Marland Kitchen apixaban (ELIQUIS) 5 MG TABS tablet Take 1 tablet by mouth twice daily. 60 tablet 5  . blood glucose meter kit and supplies KIT Dispense based on patient and insurance preference. Use up to four times daily as directed. (FOR ICD-9 250.00, 250.01). 1 each 0  . citalopram (CELEXA) 20 MG tablet Take 20 mg daily by mouth.     . diltiazem (CARDIZEM CD) 360 MG 24 hr capsule Take 1 capsule (360 mg total) by mouth daily. 30 capsule 5  . glipiZIDE (GLUCOTROL) 5 MG tablet Take 1 tablet (5 mg total) by mouth daily before breakfast.    . Glycopyrrolate-Formoterol (BEVESPI AEROSPHERE) 9-4.8 MCG/ACT AERO Inhale 2 puffs 2 (two) times daily into the lungs. 1 Inhaler 0  . Guaifenesin (MUCINEX MAXIMUM STRENGTH) 1200 MG TB12 Take 1,200 mg by mouth 2 (two) times daily.    . insulin glargine (LANTUS) 100 UNIT/ML injection Inject 0.1 mLs (10 Units total) at bedtime into the skin. 10 mL 11  . ipratropium-albuterol (DUONEB) 0.5-2.5 (3) MG/3ML SOLN Take 3 mLs by nebulization every 6 (six) hours as needed. (Patient taking differently: Take 3 mLs by nebulization every 6 (six) hours as needed (shortness of breath). ) 120 mL 3  .  losartan (COZAAR) 50 MG tablet Take 50 mg by mouth daily.  0  . methylPREDNISolone (MEDROL DOSEPAK) 4 MG TBPK tablet 6 tabs po day 1, 5 tabs po day 2, 4 tabs po day 3, 3 tabs po day 4, 2 tabs po day 5, 1 tab po day 6 and then stop 21 tablet 0  . non-metallic deodorant (ALRA) MISC Apply 1 application topically.    . nystatin (MYCOSTATIN) 100000 UNIT/ML suspension Take 5 mLs (500,000 Units total) 3 (three) times daily by mouth. Make sure to swish and swallow. 150 mL 1  . polyethylene glycol (MIRALAX / GLYCOLAX) packet Take 17 g by mouth  daily. (Patient taking differently: Take 17 g by mouth daily as needed for mild constipation. ) 14 each 0  . Respiratory Therapy Supplies (FLUTTER) DEVI 1 Device as needed by Does not apply route. 1 each 0  . simvastatin (ZOCOR) 40 MG tablet Take 40 mg by mouth every evening.    Marland Kitchen Spacer/Aero Chamber Mouthpiece MISC 1 Device by Does not apply route as directed. 1 each 0  . Wound Dressings (SONAFINE) Apply 1 application topically 2 (two) times daily. Apply after rad txs and at bedtimes daily,nothing 4 hours prior to rad tx     No current facility-administered medications for this visit.     SURGICAL HISTORY:  Past Surgical History:  Procedure Laterality Date  . CARDIAC CATHETERIZATION  1999  . CARPAL TUNNEL RELEASE  2/13   right-GSC  . CARPAL TUNNEL RELEASE  08/15/2011   Procedure: CARPAL TUNNEL RELEASE;  Surgeon: Linna Hoff, MD;  Location: [facility];  Service: Orthopedics;  Laterality: Left;  . CATARACT EXTRACTION    . CERVICAL FUSION  2002  . COLONOSCOPY    . ENDOBRONCHIAL ULTRASOUND Bilateral 03/18/2017   Procedure: ENDOBRONCHIAL ULTRASOUND;  Surgeon: Javier Glazier, MD;  Location: [facility];  Service: Cardiopulmonary;  Laterality: Bilateral;  . TONSILLECTOMY    . VIDEO BRONCHOSCOPY Bilateral 02/25/2017   Procedure: VIDEO BRONCHOSCOPY WITHOUT FLUORO;  Surgeon: Javier Glazier, MD;  Location: [facility];  Service: Cardiopulmonary;  Laterality: Bilateral;    REVIEW OF SYSTEMS:  Constitutional: positive for fatigue and weight loss Eyes: negative Ears, nose, mouth, throat, and face: negative Respiratory: positive for cough and dyspnea on exertion Cardiovascular: negative Gastrointestinal: negative Genitourinary:negative Integument/breast: negative Hematologic/lymphatic: negative Musculoskeletal:positive for muscle weakness Neurological: negative Behavioral/Psych: negative Endocrine: negative Allergic/Immunologic: negative   PHYSICAL EXAMINATION:  General appearance: alert, cooperative, fatigued and mild distress Head: Normocephalic, without obvious abnormality, atraumatic Neck: no adenopathy, no JVD, supple, symmetrical, trachea midline and thyroid not enlarged, symmetric, no tenderness/mass/nodules Lymph nodes: Cervical, supraclavicular, and axillary nodes normal. Resp: wheezes bilaterally Back: symmetric, no curvature. ROM normal. No CVA tenderness. Cardio: regular rate and rhythm, S1, S2 normal, no murmur, click, rub or gallop GI: soft, non-tender; bowel sounds normal; no masses,  no organomegaly Extremities: edema 1+ edema in the upper extremity bilaterally Neurologic: Alert and oriented X 3, normal strength and tone. Normal symmetric reflexes. Normal coordination and gait  ECOG PERFORMANCE STATUS: 1 - Symptomatic but completely ambulatory  Blood pressure 138/78, pulse 90, temperature (!) 95.1 F (35.1 C), temperature source Axillary, resp. rate 20, height 5' (1.524 m), weight 128 lb 3.2 oz (58.2 kg), SpO2 (!) 86 %.  LABORATORY DATA: Lab Results  Component Value Date   WBC 15.5 (H) 05/10/2017   HGB 11.4 (L) 05/10/2017   HCT 35.6 05/10/2017   MCV 99.4 05/10/2017   PLT 339 05/10/2017  Chemistry      Component Value Date/Time   NA 135 (L) 04/22/2017 1227   K 3.9 04/22/2017 1227   CL 100 (L) 04/14/2017 0507   CO2 29 04/22/2017 1227   BUN 21.3 04/22/2017 1227   CREATININE 0.6 04/22/2017 1227      Component Value Date/Time   CALCIUM 9.4 04/22/2017 1227   ALKPHOS 58 04/22/2017 1227   AST 9 04/22/2017 1227   ALT 15 04/22/2017 1227   BILITOT 0.46 04/22/2017 1227       RADIOGRAPHIC STUDIES: Dg Chest 1 View  Result Date: 04/24/2017 CLINICAL DATA:  Post right thoracentesis EXAM: CHEST 1 VIEW COMPARISON:  04/22/2017 FINDINGS: Decreasing right effusion following thoracentesis. No pneumothorax. Heart is normal size. Right apical nodular density is noted, stable since prior study. Cavitary mass in the left mid lung  again noted, stable. IMPRESSION: Decreasing right effusion following thoracentesis.  No pneumothorax. Stable left mid lung cavitary mass and right apical nodular density. Electronically Signed   By: Rolm Baptise M.D.   On: 04/24/2017 11:54   Dg Chest 2 View  Result Date: 04/11/2017 CLINICAL DATA:  Hyperglycemia.  History of lung cancer. EXAM: CHEST  2 VIEW COMPARISON:  Chest CT 03/25/2017 FINDINGS: Chronic right upper lobe collapse. Known chronic nodular opacity in the left mid lung. No evidence of pneumonia or edema. No effusion or pneumothorax. Normal heart size. IMPRESSION: Known right upper lobe collapse and cavitary left lung nodule. No acute superimposed finding. Electronically Signed   By: Monte Fantasia M.D.   On: 04/11/2017 11:46   Dg Chest Right Decubitus  Result Date: 04/22/2017 CLINICAL DATA:  Recent pneumonia.  History of lung cancer EXAM: CHEST - RIGHT DECUBITUS COMPARISON:  04/11/2017 FINDINGS: Postsurgical changes in the right hilum. Small layering right pleural effusion. No left pleural effusion. No pneumothorax. Stable cardiomediastinal silhouette. No acute osseous abnormality. IMPRESSION: Small layering right pleural effusion. Electronically Signed   By: Kathreen Devoid   On: 04/22/2017 15:20   US Thoracentesis Asp Pleural Space W/img Guide  Result Date: 04/24/2017 INDICATION: Lung cancer, dyspnea, right pleural effusion. Request made for diagnostic and therapeutic right thoracentesis. EXAM: ULTRASOUND GUIDED DIAGNOSTIC AND THERAPEUTIC RIGHT THORACENTESIS MEDICATIONS: None. COMPLICATIONS: None immediate. PROCEDURE: An ultrasound guided thoracentesis was thoroughly discussed with the patient and questions answered. The benefits, risks, alternatives and complications were also discussed. The patient understands and wishes to proceed with the procedure. Written consent was obtained. Ultrasound was performed to localize and mark an adequate pocket of fluid in the right chest. The area was  then prepped and draped in the normal sterile fashion. 2% Lidocaine was used for local anesthesia. Under ultrasound guidance a Safe-T-Centesis catheter was introduced. Thoracentesis was performed. The catheter was removed and a dressing applied. FINDINGS: A total of approximately 425 cc of slightly hazy, yellow fluid was removed. Samples were sent to the laboratory as requested by the clinical team. IMPRESSION: Successful ultrasound guided diagnostic and therapeutic right thoracentesis yielding 425 cc of pleural fluid. Follow-up chest x-ray revealed no pneumothorax. Read by: Rowe Robert, PA-C Electronically Signed   By: Markus Daft M.D.   On: 04/24/2017 12:08    ASSESSMENT AND PLAN: This is a very pleasant 71 years old white female with a stage IV non-small cell lung cancer, poorly differentiated carcinoma with SVC syndrome currently undergoing palliative radiotherapy to the right upper lobe lung mass. She continues to tolerate her palliative radiotherapy fairly well but the patient continues to have swelling of the upper extremities. She  is currently on a taper dose of steroids. She was supposed to start a different cycle of systemic chemotherapy with carboplatin and paclitaxel today but the patient is feeling very weak and tired.  She also still has 6 more fractions of radiotherapy to complete her treatment. I had a lengthy discussion with the patient and her husband today about her condition.  I recommended for her to delay the start of the systemic chemotherapy by 2 more weeks. I will arrange for the patient to come back for follow-up visit in 2 weeks with the start of cycle #1 of her systemic chemotherapy. She will continue on the taper dose of steroids as well as Eliquis for the swelling of the upper extremities. The patient was advised to call immediately if she has any concerning symptoms in the interval. The patient voices understanding of current disease status and treatment options and is in  agreement with the current care plan.  All questions were answered. The patient knows to call the clinic with any problems, questions or concerns. We can certainly see the patient much sooner if necessary.  I spent 15 minutes counseling the patient face to face. The total time spent in the appointment was 25 minutes.  Disclaimer: This note was dictated with voice recognition software. Similar sounding words can inadvertently be transcribed and may not be corrected upon review.

## 2017-05-11 ENCOUNTER — Telehealth: Payer: Self-pay | Admitting: Internal Medicine

## 2017-05-11 ENCOUNTER — Encounter: Payer: Self-pay | Admitting: Internal Medicine

## 2017-05-11 NOTE — Telephone Encounter (Signed)
Message to MM and infusion directors re leaving patient as scheduled for lab/fu/tx 12/31. No room to move to 12/27 as requested per 12/14 los.

## 2017-05-13 ENCOUNTER — Ambulatory Visit
Admission: RE | Admit: 2017-05-13 | Discharge: 2017-05-13 | Disposition: A | Discharge status: Home / Self Care | Payer: Medicare Other | Source: Ambulatory Visit | Attending: Radiation Oncology | Admitting: Radiation Oncology

## 2017-05-13 ENCOUNTER — Ambulatory Visit: Payer: Medicare Other

## 2017-05-13 DIAGNOSIS — Z510 Encounter for antineoplastic radiation therapy: Secondary | ICD-10-CM | POA: Diagnosis not present

## 2017-05-13 NOTE — Telephone Encounter (Signed)
Per response from MM ([facility]) ok to leave appointments as scheduled for 12/31. Spoke with patient she is aware

## 2017-05-14 ENCOUNTER — Ambulatory Visit
Admission: RE | Admit: 2017-05-14 | Discharge: 2017-05-14 | Disposition: A | Discharge status: Home / Self Care | Payer: Medicare Other | Source: Ambulatory Visit | Attending: Radiation Oncology | Admitting: Radiation Oncology

## 2017-05-14 ENCOUNTER — Ambulatory Visit: Payer: Medicare Other

## 2017-05-14 DIAGNOSIS — Z510 Encounter for antineoplastic radiation therapy: Secondary | ICD-10-CM | POA: Diagnosis not present

## 2017-05-15 ENCOUNTER — Ambulatory Visit: Payer: Medicare Other

## 2017-05-15 ENCOUNTER — Ambulatory Visit
Admission: RE | Admit: 2017-05-15 | Discharge: 2017-05-15 | Disposition: A | Discharge status: Home / Self Care | Payer: Medicare Other | Source: Ambulatory Visit | Attending: Radiation Oncology | Admitting: Radiation Oncology

## 2017-05-15 DIAGNOSIS — Z510 Encounter for antineoplastic radiation therapy: Secondary | ICD-10-CM | POA: Diagnosis not present

## 2017-05-16 ENCOUNTER — Ambulatory Visit: Payer: Medicare Other

## 2017-05-16 ENCOUNTER — Ambulatory Visit
Admission: RE | Admit: 2017-05-16 | Discharge: 2017-05-16 | Disposition: A | Discharge status: Home / Self Care | Payer: Medicare Other | Source: Ambulatory Visit | Attending: Radiation Oncology | Admitting: Radiation Oncology

## 2017-05-16 DIAGNOSIS — Z510 Encounter for antineoplastic radiation therapy: Secondary | ICD-10-CM | POA: Diagnosis not present

## 2017-05-17 ENCOUNTER — Telehealth: Payer: Self-pay | Admitting: Acute Care

## 2017-05-17 ENCOUNTER — Encounter: Payer: Self-pay | Admitting: Radiation Oncology

## 2017-05-17 ENCOUNTER — Ambulatory Visit
Admission: RE | Admit: 2017-05-17 | Discharge: 2017-05-17 | Disposition: A | Discharge status: Home / Self Care | Payer: Medicare Other | Source: Ambulatory Visit | Attending: Radiation Oncology | Admitting: Radiation Oncology

## 2017-05-17 DIAGNOSIS — J189 Pneumonia, unspecified organism: Secondary | ICD-10-CM

## 2017-05-17 DIAGNOSIS — Z510 Encounter for antineoplastic radiation therapy: Secondary | ICD-10-CM | POA: Diagnosis not present

## 2017-05-17 DIAGNOSIS — J449 Chronic obstructive pulmonary disease, unspecified: Secondary | ICD-10-CM

## 2017-05-17 MED ORDER — IPRATROPIUM-ALBUTEROL 0.5-2.5 (3) MG/3ML IN SOLN: 3.0000 mL | Freq: Four times a day (QID) | RESPIRATORY_TRACT | 1 refills | Status: AC | PRN

## 2017-05-17 NOTE — Telephone Encounter (Signed)
Spoke with patient she is aware sending RX of duoneb to [facility] in [place] with one refill. Appt is made today with BQ 07-23-17

## 2017-05-27 ENCOUNTER — Telehealth: Payer: Self-pay | Admitting: Internal Medicine

## 2017-05-27 ENCOUNTER — Ambulatory Visit (HOSPITAL_BASED_OUTPATIENT_CLINIC_OR_DEPARTMENT_OTHER): Payer: Medicare Other

## 2017-05-27 ENCOUNTER — Other Ambulatory Visit (HOSPITAL_COMMUNITY): Payer: Self-pay

## 2017-05-27 ENCOUNTER — Other Ambulatory Visit: Payer: Self-pay | Admitting: Medical Oncology

## 2017-05-27 ENCOUNTER — Other Ambulatory Visit: Payer: Self-pay

## 2017-05-27 ENCOUNTER — Other Ambulatory Visit (HOSPITAL_BASED_OUTPATIENT_CLINIC_OR_DEPARTMENT_OTHER): Payer: Medicare Other

## 2017-05-27 ENCOUNTER — Ambulatory Visit (HOSPITAL_BASED_OUTPATIENT_CLINIC_OR_DEPARTMENT_OTHER): Payer: Medicare Other | Admitting: Internal Medicine

## 2017-05-27 ENCOUNTER — Inpatient Hospital Stay (HOSPITAL_COMMUNITY)
Admission: EM | Admit: 2017-05-27 | Discharge: 2017-06-07 | DRG: 189 | Disposition: A | Discharge status: Hospice | Payer: Medicare Other | Attending: Internal Medicine | Admitting: Internal Medicine

## 2017-05-27 ENCOUNTER — Encounter: Payer: Self-pay | Admitting: Internal Medicine

## 2017-05-27 ENCOUNTER — Telehealth: Payer: Self-pay | Admitting: Hematology

## 2017-05-27 ENCOUNTER — Encounter (HOSPITAL_COMMUNITY): Payer: Self-pay

## 2017-05-27 ENCOUNTER — Emergency Department (HOSPITAL_COMMUNITY): Payer: Medicare Other

## 2017-05-27 VITALS — BP 152/61 | HR 102 | Temp 98.1°F | Resp 16 | Ht 60.0 in | Wt 126.1 lb

## 2017-05-27 VITALS — BP 123/69 | HR 93 | Temp 97.9°F | Resp 18

## 2017-05-27 DIAGNOSIS — D701 Agranulocytosis secondary to cancer chemotherapy: Secondary | ICD-10-CM | POA: Diagnosis present

## 2017-05-27 DIAGNOSIS — R0602 Shortness of breath: Secondary | ICD-10-CM

## 2017-05-27 DIAGNOSIS — Z7189 Other specified counseling: Secondary | ICD-10-CM

## 2017-05-27 DIAGNOSIS — C3490 Malignant neoplasm of unspecified part of unspecified bronchus or lung: Secondary | ICD-10-CM

## 2017-05-27 DIAGNOSIS — D509 Iron deficiency anemia, unspecified: Secondary | ICD-10-CM | POA: Diagnosis present

## 2017-05-27 DIAGNOSIS — R197 Diarrhea, unspecified: Secondary | ICD-10-CM | POA: Diagnosis not present

## 2017-05-27 DIAGNOSIS — Z9981 Dependence on supplemental oxygen: Secondary | ICD-10-CM | POA: Diagnosis not present

## 2017-05-27 DIAGNOSIS — Z87891 Personal history of nicotine dependence: Secondary | ICD-10-CM

## 2017-05-27 DIAGNOSIS — R4182 Altered mental status, unspecified: Secondary | ICD-10-CM | POA: Diagnosis present

## 2017-05-27 DIAGNOSIS — C3411 Malignant neoplasm of upper lobe, right bronchus or lung: Secondary | ICD-10-CM | POA: Diagnosis present

## 2017-05-27 DIAGNOSIS — Z800 Family history of malignant neoplasm of digestive organs: Secondary | ICD-10-CM

## 2017-05-27 DIAGNOSIS — I871 Compression of vein: Secondary | ICD-10-CM | POA: Diagnosis present

## 2017-05-27 DIAGNOSIS — E785 Hyperlipidemia, unspecified: Secondary | ICD-10-CM | POA: Diagnosis present

## 2017-05-27 DIAGNOSIS — Z7901 Long term (current) use of anticoagulants: Secondary | ICD-10-CM

## 2017-05-27 DIAGNOSIS — Z923 Personal history of irradiation: Secondary | ICD-10-CM | POA: Diagnosis not present

## 2017-05-27 DIAGNOSIS — J441 Chronic obstructive pulmonary disease with (acute) exacerbation: Secondary | ICD-10-CM | POA: Diagnosis present

## 2017-05-27 DIAGNOSIS — Y92538 Other ambulatory health services establishments as the place of occurrence of the external cause: Secondary | ICD-10-CM

## 2017-05-27 DIAGNOSIS — M7989 Other specified soft tissue disorders: Secondary | ICD-10-CM

## 2017-05-27 DIAGNOSIS — Z794 Long term (current) use of insulin: Secondary | ICD-10-CM | POA: Diagnosis not present

## 2017-05-27 DIAGNOSIS — I480 Paroxysmal atrial fibrillation: Secondary | ICD-10-CM | POA: Diagnosis present

## 2017-05-27 DIAGNOSIS — I110 Hypertensive heart disease with heart failure: Secondary | ICD-10-CM | POA: Diagnosis present

## 2017-05-27 DIAGNOSIS — Z888 Allergy status to other drugs, medicaments and biological substances status: Secondary | ICD-10-CM

## 2017-05-27 DIAGNOSIS — Z8042 Family history of malignant neoplasm of prostate: Secondary | ICD-10-CM

## 2017-05-27 DIAGNOSIS — Z515 Encounter for palliative care: Secondary | ICD-10-CM

## 2017-05-27 DIAGNOSIS — T451X5A Adverse effect of antineoplastic and immunosuppressive drugs, initial encounter: Secondary | ICD-10-CM | POA: Diagnosis present

## 2017-05-27 DIAGNOSIS — I459 Conduction disorder, unspecified: Secondary | ICD-10-CM | POA: Diagnosis present

## 2017-05-27 DIAGNOSIS — Z66 Do not resuscitate: Secondary | ICD-10-CM | POA: Diagnosis not present

## 2017-05-27 DIAGNOSIS — J9621 Acute and chronic respiratory failure with hypoxia: Secondary | ICD-10-CM | POA: Diagnosis present

## 2017-05-27 DIAGNOSIS — Z5111 Encounter for antineoplastic chemotherapy: Secondary | ICD-10-CM

## 2017-05-27 DIAGNOSIS — J9622 Acute and chronic respiratory failure with hypercapnia: Secondary | ICD-10-CM | POA: Diagnosis present

## 2017-05-27 DIAGNOSIS — R69 Illness, unspecified: Secondary | ICD-10-CM

## 2017-05-27 DIAGNOSIS — Z808 Family history of malignant neoplasm of other organs or systems: Secondary | ICD-10-CM

## 2017-05-27 DIAGNOSIS — Z833 Family history of diabetes mellitus: Secondary | ICD-10-CM

## 2017-05-27 DIAGNOSIS — Z880 Allergy status to penicillin: Secondary | ICD-10-CM

## 2017-05-27 DIAGNOSIS — F329 Major depressive disorder, single episode, unspecified: Secondary | ICD-10-CM | POA: Diagnosis present

## 2017-05-27 DIAGNOSIS — E119 Type 2 diabetes mellitus without complications: Secondary | ICD-10-CM | POA: Diagnosis present

## 2017-05-27 DIAGNOSIS — Z803 Family history of malignant neoplasm of breast: Secondary | ICD-10-CM

## 2017-05-27 DIAGNOSIS — I10 Essential (primary) hypertension: Secondary | ICD-10-CM

## 2017-05-27 DIAGNOSIS — I5033 Acute on chronic diastolic (congestive) heart failure: Secondary | ICD-10-CM | POA: Diagnosis present

## 2017-05-27 HISTORY — DX: Malignant (primary) neoplasm, unspecified: C80.1

## 2017-05-27 LAB — BASIC METABOLIC PANEL
ANION GAP: 6 (ref 5–15)
BUN: 18 mg/dL (ref 6–20)
CALCIUM: 8.4 mg/dL — AB (ref 8.9–10.3)
CO2: 35 mmol/L — ABNORMAL HIGH (ref 22–32)
Chloride: 99 mmol/L — ABNORMAL LOW (ref 101–111)
Creatinine, Ser: <0.3 mg/dL — ABNORMAL LOW (ref 0.44–1.00)
Glucose, Bld: 158 mg/dL — ABNORMAL HIGH (ref 65–99)
Potassium: 4.1 mmol/L (ref 3.5–5.1)
Sodium: 140 mmol/L (ref 135–145)

## 2017-05-27 LAB — COMPREHENSIVE METABOLIC PANEL
ALBUMIN: 2.5 g/dL — AB (ref 3.5–5.0)
ALK PHOS: 57 U/L (ref 40–150)
ALT: 12 U/L (ref 0–55)
ANION GAP: 6 meq/L (ref 3–11)
AST: 8 U/L (ref 5–34)
BUN: 16.5 mg/dL (ref 7.0–26.0)
CALCIUM: 8.9 mg/dL (ref 8.4–10.4)
CHLORIDE: 98 meq/L (ref 98–109)
CO2: 37 mEq/L — ABNORMAL HIGH (ref 22–29)
CREATININE: 0.5 mg/dL — AB (ref 0.6–1.1)
EGFR: >60 mL/min/{1.73_m2} (ref >60)
Glucose: 165 mg/dl — ABNORMAL HIGH (ref 70–140)
Potassium: 3.7 mEq/L (ref 3.5–5.1)
Sodium: 141 mEq/L (ref 136–145)
Total Bilirubin: 0.37 mg/dL (ref 0.20–1.20)
Total Protein: 5.6 g/dL — ABNORMAL LOW (ref 6.4–8.3)

## 2017-05-27 LAB — CBC WITH DIFFERENTIAL/PLATELET
BASO%: 0.7 % (ref 0.0–2.0)
BASOS ABS: 0 10*3/uL (ref 0.0–0.1)
EOS%: 4.7 % (ref 0.0–7.0)
Eosinophils Absolute: 0.2 10*3/uL (ref 0.0–0.5)
HEMATOCRIT: 33 % — AB (ref 34.8–46.6)
HEMOGLOBIN: 10.7 g/dL — AB (ref 11.6–15.9)
LYMPH#: 0.2 10*3/uL — AB (ref 0.9–3.3)
LYMPH%: 3.4 % — ABNORMAL LOW (ref 14.0–49.7)
MCH: 32.8 pg (ref 25.1–34.0)
MCHC: 32.4 g/dL (ref 31.5–36.0)
MCV: 101.2 fL — ABNORMAL HIGH (ref 79.5–101.0)
MONO#: 0.6 10*3/uL (ref 0.1–0.9)
MONO%: 11.6 % (ref 0.0–14.0)
NEUT#: 4.2 10*3/uL (ref 1.5–6.5)
NEUT%: 79.6 % — AB (ref 38.4–76.8)
PLATELETS: 261 10*3/uL (ref 145–400)
RBC: 3.26 10*6/uL — ABNORMAL LOW (ref 3.70–5.45)
RDW: 19.7 % — ABNORMAL HIGH (ref 11.2–14.5)
WBC: 5.2 10*3/uL (ref 3.9–10.3)

## 2017-05-27 LAB — BLOOD GAS, VENOUS
Acid-Base Excess: 7.8 mmol/L — ABNORMAL HIGH (ref 0.0–2.0)
Bicarbonate: 35.7 mmol/L — ABNORMAL HIGH (ref 20.0–28.0)
FIO2: 21
O2 Saturation: 77.9 %
PATIENT TEMPERATURE: 37
PCO2 VEN: 73 mmHg — AB (ref 44.0–60.0)
pH, Ven: 7.311 (ref 7.250–7.430)
pO2, Ven: 49.4 mmHg — ABNORMAL HIGH (ref 32.0–45.0)

## 2017-05-27 LAB — CBG MONITORING, ED: GLUCOSE-CAPILLARY: 157 mg/dL — AB (ref 65–99)

## 2017-05-27 MED ORDER — SODIUM CHLORIDE 0.9 % IV SOLN
175.0000 mg/m2 | Freq: Once | INTRAVENOUS | Status: AC
  Administered 2017-05-27: 276 mg via INTRAVENOUS
  Filled 2017-05-27: qty 46

## 2017-05-27 MED ORDER — DILTIAZEM HCL ER COATED BEADS 180 MG PO CP24
360.0000 mg | ORAL_CAPSULE | Freq: Every day | ORAL | Status: DC
  Administered 2017-05-28 – 2017-06-07 (×11): 360 mg via ORAL
  Filled 2017-05-27 (×11): qty 2

## 2017-05-27 MED ORDER — IPRATROPIUM-ALBUTEROL 0.5-2.5 (3) MG/3ML IN SOLN
3.0000 mL | Freq: Once | RESPIRATORY_TRACT | Status: AC
Start: 2017-05-27 — End: 2017-05-28
  Administered 2017-05-28: 3 mL via RESPIRATORY_TRACT
  Filled 2017-05-27: qty 3

## 2017-05-27 MED ORDER — LOSARTAN POTASSIUM 50 MG PO TABS
50.0000 mg | ORAL_TABLET | Freq: Every day | ORAL | Status: DC
  Administered 2017-05-28 – 2017-06-07 (×11): 50 mg via ORAL
  Filled 2017-05-27 (×11): qty 1

## 2017-05-27 MED ORDER — PROCHLORPERAZINE MALEATE 10 MG PO TABS: 10.0000 mg | ORAL_TABLET | Freq: Four times a day (QID) | ORAL | 0 refills | Status: AC | PRN

## 2017-05-27 MED ORDER — FUROSEMIDE 10 MG/ML IJ SOLN
40.0000 mg | Freq: Once | INTRAMUSCULAR | Status: AC
  Administered 2017-05-28: 40 mg via INTRAVENOUS
  Filled 2017-05-27: qty 4

## 2017-05-27 MED ORDER — PALONOSETRON HCL INJECTION 0.25 MG/5ML
0.2500 mg | Freq: Once | INTRAVENOUS | Status: AC
  Administered 2017-05-27: 0.25 mg via INTRAVENOUS

## 2017-05-27 MED ORDER — FUROSEMIDE 10 MG/ML IJ SOLN
40.0000 mg | Freq: Two times a day (BID) | INTRAMUSCULAR | Status: DC
  Administered 2017-05-28: 40 mg via INTRAVENOUS
  Filled 2017-05-27: qty 4

## 2017-05-27 MED ORDER — CITALOPRAM HYDROBROMIDE 20 MG PO TABS
20.0000 mg | ORAL_TABLET | Freq: Every day | ORAL | Status: DC
  Administered 2017-05-28 – 2017-06-07 (×11): 20 mg via ORAL
  Filled 2017-05-27: qty 2
  Filled 2017-05-27 (×2): qty 1
  Filled 2017-05-27 (×2): qty 2
  Filled 2017-05-27: qty 1
  Filled 2017-05-27 (×2): qty 2
  Filled 2017-05-27: qty 1
  Filled 2017-05-27 (×2): qty 2

## 2017-05-27 MED ORDER — SODIUM CHLORIDE 0.9% FLUSH
3.0000 mL | INTRAVENOUS | Status: DC | PRN
  Administered 2017-05-28 – 2017-06-03 (×2): 3 mL via INTRAVENOUS
  Filled 2017-05-27 (×2): qty 3

## 2017-05-27 MED ORDER — INSULIN ASPART 100 UNIT/ML ~~LOC~~ SOLN
0.0000 [IU] | Freq: Three times a day (TID) | SUBCUTANEOUS | Status: DC
Start: 2017-05-28 — End: 2017-06-07
  Administered 2017-05-28: 1 [IU] via SUBCUTANEOUS
  Administered 2017-05-28: 5 [IU] via SUBCUTANEOUS
  Administered 2017-05-28: 1 [IU] via SUBCUTANEOUS
  Administered 2017-05-29 (×2): 5 [IU] via SUBCUTANEOUS
  Administered 2017-05-29: 2 [IU] via SUBCUTANEOUS
  Administered 2017-05-30: 3 [IU] via SUBCUTANEOUS
  Administered 2017-05-30: 5 [IU] via SUBCUTANEOUS
  Administered 2017-05-30: 1 [IU] via SUBCUTANEOUS
  Administered 2017-05-31: 3 [IU] via SUBCUTANEOUS
  Administered 2017-05-31: 1 [IU] via SUBCUTANEOUS
  Administered 2017-05-31: 2 [IU] via SUBCUTANEOUS
  Administered 2017-06-01: 3 [IU] via SUBCUTANEOUS
  Administered 2017-06-01: 2 [IU] via SUBCUTANEOUS
  Administered 2017-06-02 (×2): 1 [IU] via SUBCUTANEOUS
  Administered 2017-06-02 – 2017-06-03 (×2): 2 [IU] via SUBCUTANEOUS
  Administered 2017-06-03 – 2017-06-04 (×3): 5 [IU] via SUBCUTANEOUS
  Administered 2017-06-05: 3 [IU] via SUBCUTANEOUS
  Administered 2017-06-05: 9 [IU] via SUBCUTANEOUS
  Administered 2017-06-06: 1 [IU] via SUBCUTANEOUS
  Administered 2017-06-06: 3 [IU] via SUBCUTANEOUS
  Administered 2017-06-07: 7 [IU] via SUBCUTANEOUS
  Administered 2017-06-07: 2 [IU] via SUBCUTANEOUS

## 2017-05-27 MED ORDER — POLYETHYLENE GLYCOL 3350 17 G PO PACK: 17.0000 g | PACK | Freq: Every day | ORAL | Status: DC | PRN

## 2017-05-27 MED ORDER — SIMVASTATIN 40 MG PO TABS: 40.0000 mg | ORAL_TABLET | Freq: Every evening | ORAL | Status: DC

## 2017-05-27 MED ORDER — SODIUM CHLORIDE 0.9 % IV SOLN
Freq: Once | INTRAVENOUS | Status: AC
  Administered 2017-05-27: 13:00:00 via INTRAVENOUS

## 2017-05-27 MED ORDER — SODIUM CHLORIDE 0.9% FLUSH
3.0000 mL | Freq: Two times a day (BID) | INTRAVENOUS | Status: DC
  Administered 2017-05-28 – 2017-06-05 (×9): 3 mL via INTRAVENOUS

## 2017-05-27 MED ORDER — ONDANSETRON HCL 4 MG/2ML IJ SOLN
4.0000 mg | Freq: Four times a day (QID) | INTRAMUSCULAR | Status: DC | PRN
  Administered 2017-05-31 (×2): 4 mg via INTRAVENOUS
  Filled 2017-05-27 (×2): qty 2

## 2017-05-27 MED ORDER — SODIUM CHLORIDE 0.9 % IV SOLN
250.0000 mL | INTRAVENOUS | Status: DC | PRN
  Administered 2017-06-01: 250 mL via INTRAVENOUS

## 2017-05-27 MED ORDER — ARFORMOTEROL TARTRATE 15 MCG/2ML IN NEBU
15.0000 ug | INHALATION_SOLUTION | Freq: Two times a day (BID) | RESPIRATORY_TRACT | Status: DC
  Administered 2017-05-28 – 2017-06-07 (×21): 15 ug via RESPIRATORY_TRACT
  Filled 2017-05-27 (×21): qty 2

## 2017-05-27 MED ORDER — GUAIFENESIN ER 600 MG PO TB12
1200.0000 mg | ORAL_TABLET | Freq: Two times a day (BID) | ORAL | Status: DC
  Administered 2017-05-28 – 2017-06-07 (×21): 1200 mg via ORAL
  Filled 2017-05-27 (×21): qty 2

## 2017-05-27 MED ORDER — DIPHENHYDRAMINE HCL 50 MG/ML IJ SOLN
INTRAMUSCULAR | Status: AC
  Filled 2017-05-27: qty 1

## 2017-05-27 MED ORDER — DIPHENHYDRAMINE HCL 25 MG PO CAPS
ORAL_CAPSULE | ORAL | Status: AC
  Filled 2017-05-27: qty 2

## 2017-05-27 MED ORDER — NYSTATIN 100000 UNIT/ML MT SUSP
5.0000 mL | Freq: Three times a day (TID) | OROMUCOSAL | Status: DC
  Administered 2017-05-28 – 2017-06-07 (×28): 500000 [IU] via ORAL
  Filled 2017-05-27 (×28): qty 5

## 2017-05-27 MED ORDER — ACETAMINOPHEN 325 MG PO TABS
650.0000 mg | ORAL_TABLET | ORAL | Status: DC | PRN
Start: 2017-05-27 — End: 2017-06-07

## 2017-05-27 MED ORDER — SODIUM CHLORIDE 0.9 % IV SOLN
370.0000 mg | Freq: Once | INTRAVENOUS | Status: DC
  Filled 2017-05-27: qty 37

## 2017-05-27 MED ORDER — INSULIN ASPART 100 UNIT/ML ~~LOC~~ SOLN
0.0000 [IU] | Freq: Every day | SUBCUTANEOUS | Status: DC
  Administered 2017-05-28: 4 [IU] via SUBCUTANEOUS
  Administered 2017-05-30: 3 [IU] via SUBCUTANEOUS
  Administered 2017-05-31 – 2017-06-03 (×3): 2 [IU] via SUBCUTANEOUS
  Administered 2017-06-05: 3 [IU] via SUBCUTANEOUS
  Administered 2017-06-06: 2 [IU] via SUBCUTANEOUS

## 2017-05-27 MED ORDER — DEXAMETHASONE SODIUM PHOSPHATE 100 MG/10ML IJ SOLN
20.0000 mg | Freq: Once | INTRAMUSCULAR | Status: AC
  Administered 2017-05-27: 20 mg via INTRAVENOUS
  Filled 2017-05-27: qty 2

## 2017-05-27 MED ORDER — UMECLIDINIUM BROMIDE 62.5 MCG/INH IN AEPB
1.0000 | INHALATION_SPRAY | Freq: Every day | RESPIRATORY_TRACT | Status: DC
  Administered 2017-05-29 – 2017-06-04 (×7): 1 via RESPIRATORY_TRACT
  Filled 2017-05-27 (×2): qty 7

## 2017-05-27 MED ORDER — FAMOTIDINE IN NACL 20-0.9 MG/50ML-% IV SOLN
INTRAVENOUS | Status: AC
  Filled 2017-05-27: qty 50

## 2017-05-27 MED ORDER — FUROSEMIDE 20 MG PO TABS: 40.0000 mg | ORAL_TABLET | Freq: Every day | ORAL | 0 refills | Status: DC

## 2017-05-27 MED ORDER — IPRATROPIUM-ALBUTEROL 0.5-2.5 (3) MG/3ML IN SOLN
3.0000 mL | RESPIRATORY_TRACT | Status: DC | PRN
  Filled 2017-05-27: qty 3

## 2017-05-27 MED ORDER — INSULIN GLARGINE 100 UNIT/ML ~~LOC~~ SOLN
10.0000 [IU] | Freq: Every day | SUBCUTANEOUS | Status: DC
  Administered 2017-05-28 – 2017-05-30 (×4): 10 [IU] via SUBCUTANEOUS
  Filled 2017-05-27 (×5): qty 0.1

## 2017-05-27 MED ORDER — APIXABAN 5 MG PO TABS
5.0000 mg | ORAL_TABLET | Freq: Two times a day (BID) | ORAL | Status: DC
  Administered 2017-05-28 – 2017-06-07 (×21): 5 mg via ORAL
  Filled 2017-05-27 (×21): qty 1

## 2017-05-27 MED ORDER — PALONOSETRON HCL INJECTION 0.25 MG/5ML
INTRAVENOUS | Status: AC
  Filled 2017-05-27: qty 5

## 2017-05-27 MED ORDER — DIPHENHYDRAMINE HCL 50 MG/ML IJ SOLN
50.0000 mg | Freq: Once | INTRAMUSCULAR | Status: AC
  Administered 2017-05-27: 50 mg via INTRAVENOUS

## 2017-05-27 MED ORDER — FAMOTIDINE IN NACL 20-0.9 MG/50ML-% IV SOLN
20.0000 mg | Freq: Once | INTRAVENOUS | Status: AC
  Administered 2017-05-27: 20 mg via INTRAVENOUS

## 2017-05-27 NOTE — ED Triage Notes (Signed)
From Cancer RN reports, AMS at cancer center after IV inserted and Chemo started, first time treatment for Lung CA, IV apparently pulled out accidentally, possible infiltration of Chemo treatment to left arm, visible redness and swelling. Hx Lung cancer and COPD

## 2017-05-27 NOTE — Progress Notes (Signed)
    [facility] [phone]   [phone]  OFFICE PROGRESS NOTE  Miller, Lisa, MD [address] [place] [address]  DIAGNOSIS: Stage IV non-small cell lung cancer,poorly differentiated carcinoma,presented with right upper lobe lung mass in addition to right hilar and mediastinal lymphadenopathy as well as necrotic left upper lobe nodule and suspicious right adrenal mass.  PRIOR THERAPY: Palliative radiotherapy to the right upper lobe lung mass under the care of Dr. Moody.  CURRENT THERAPY: Systemic chemotherapy with carboplatin for AC of 5 and paclitaxel 175 mg/M2 every 3 weeks.  First dose May 27, 2017.   INTERVAL HISTORY: [name] 71 y.o. female returns to the clinic today for follow-up visit accompanied by her husband and daughter.  The patient continues to have significant swelling of the upper extremities.  The patient continues to have shortness of breath with exertion but no significant chest pain or hemoptysis.  She completed a course of palliative radiotherapy to the right upper lobe lung mass under the care of Dr. Moody.  She has no current nausea, vomiting, diarrhea or constipation.  She denied having any fever or chills.  She has no headache or visual changes.  She is here today for evaluation before starting the first cycle of her systemic chemotherapy.  MEDICAL HISTORY: Past Medical History:  Diagnosis Date  . Arthritis   . Asthma   . COPD (chronic obstructive pulmonary disease) (HCC)   . Depression   . Diabetes mellitus    diet controlled  . History of hiatal hernia   . Hyperlipemia   . Hypertension   . Pneumonia     ALLERGIES:  is allergic to penicillins and tiotropium bromide monohydrate.  MEDICATIONS:  Current Outpatient Medications  Medication Sig Dispense Refill  . acetaminophen (TYLENOL) 500 MG tablet Take 500 mg by mouth every 8 (eight) hours as needed for mild pain or headache.    . apixaban (ELIQUIS)  5 MG TABS tablet Take 1 tablet by mouth twice daily. 60 tablet 5  . blood glucose meter kit and supplies KIT Dispense based on patient and insurance preference. Use up to four times daily as directed. (FOR ICD-9 250.00, 250.01). 1 each 0  . citalopram (CELEXA) 20 MG tablet Take 20 mg daily by mouth.     . diltiazem (CARDIZEM CD) 360 MG 24 hr capsule Take 1 capsule (360 mg total) by mouth daily. 30 capsule 5  . glipiZIDE (GLUCOTROL) 5 MG tablet Take 1 tablet (5 mg total) by mouth daily before breakfast.    . Glycopyrrolate-Formoterol (BEVESPI AEROSPHERE) 9-4.8 MCG/ACT AERO Inhale 2 puffs 2 (two) times daily into the lungs. 1 Inhaler 0  . Guaifenesin (MUCINEX MAXIMUM STRENGTH) 1200 MG TB12 Take 1,200 mg by mouth 2 (two) times daily.    . insulin glargine (LANTUS) 100 UNIT/ML injection Inject 0.1 mLs (10 Units total) at bedtime into the skin. 10 mL 11  . ipratropium-albuterol (DUONEB) 0.5-2.5 (3) MG/3ML SOLN Take 3 mLs by nebulization every 6 (six) hours as needed. 120 mL 1  . losartan (COZAAR) 50 MG tablet Take 50 mg by mouth daily.  0  . methylPREDNISolone (MEDROL DOSEPAK) 4 MG TBPK tablet 6 tabs po day 1, 5 tabs po day 2, 4 tabs po day 3, 3 tabs po day 4, 2 tabs po day 5, 1 tab po day 6 and then stop 21 tablet 0  . non-metallic deodorant (ALRA) MISC Apply 1 application topically.    . nystatin (  MYCOSTATIN) 100000 UNIT/ML suspension Take 5 mLs (500,000 Units total) 3 (three) times daily by mouth. Make sure to swish and swallow. 150 mL 1  . polyethylene glycol (MIRALAX / GLYCOLAX) packet Take 17 g by mouth daily. (Patient taking differently: Take 17 g by mouth daily as needed for mild constipation. ) 14 each 0  . Respiratory Therapy Supplies (FLUTTER) DEVI 1 Device as needed by Does not apply route. 1 each 0  . simvastatin (ZOCOR) 40 MG tablet Take 40 mg by mouth every evening.    . Spacer/Aero Chamber Mouthpiece MISC 1 Device by Does not apply route as directed. 1 each 0  . Wound Dressings (SONAFINE)  Apply 1 application topically 2 (two) times daily. Apply after rad txs and at bedtimes daily,nothing 4 hours prior to rad tx     No current facility-administered medications for this visit.     SURGICAL HISTORY:  Past Surgical History:  Procedure Laterality Date  . CARDIAC CATHETERIZATION  1999  . CARPAL TUNNEL RELEASE  2/13   right-GSC  . CARPAL TUNNEL RELEASE  08/15/2011   Procedure: CARPAL TUNNEL RELEASE;  Surgeon: Fred W Ortmann, MD;  Location: [facility];  Service: Orthopedics;  Laterality: Left;  . CATARACT EXTRACTION    . CERVICAL FUSION  2002  . COLONOSCOPY    . ENDOBRONCHIAL ULTRASOUND Bilateral 03/18/2017   Procedure: ENDOBRONCHIAL ULTRASOUND;  Surgeon: Nestor, Jennings E, MD;  Location: [facility];  Service: Cardiopulmonary;  Laterality: Bilateral;  . TONSILLECTOMY    . VIDEO BRONCHOSCOPY Bilateral 02/25/2017   Procedure: VIDEO BRONCHOSCOPY WITHOUT FLUORO;  Surgeon: Nestor, Jennings E, MD;  Location: [facility];  Service: Cardiopulmonary;  Laterality: Bilateral;    REVIEW OF SYSTEMS:  Constitutional: positive for fatigue Eyes: negative Ears, nose, mouth, throat, and face: negative Respiratory: positive for cough and dyspnea on exertion Cardiovascular: negative Gastrointestinal: negative Genitourinary:negative Integument/breast: negative Hematologic/lymphatic: negative Musculoskeletal:positive for muscle weakness Neurological: negative Behavioral/Psych: negative Endocrine: negative Allergic/Immunologic: negative   PHYSICAL EXAMINATION: General appearance: alert, cooperative, fatigued and mild distress Head: Normocephalic, without obvious abnormality, atraumatic Neck: no adenopathy, no JVD, supple, symmetrical, trachea midline and thyroid not enlarged, symmetric, no tenderness/mass/nodules Lymph nodes: Cervical, supraclavicular, and axillary nodes normal. Resp: wheezes bilaterally Back: symmetric, no curvature. ROM normal. No CVA  tenderness. Cardio: regular rate and rhythm, S1, S2 normal, no murmur, click, rub or gallop GI: soft, non-tender; bowel sounds normal; no masses,  no organomegaly Extremities: edema 2+ edema in the upper extremity bilaterally Neurologic: Alert and oriented X 3, normal strength and tone. Normal symmetric reflexes. Normal coordination and gait  ECOG PERFORMANCE STATUS: 1 - Symptomatic but completely ambulatory  Blood pressure (!) 152/61, pulse (!) 102, temperature 98.1 F (36.7 C), temperature source Oral, resp. rate 16, height 5' (1.524 m), weight 126 lb 1.6 oz (57.2 kg), SpO2 90 %.  LABORATORY DATA: Lab Results  Component Value Date   WBC 15.5 (H) 05/10/2017   HGB 11.4 (L) 05/10/2017   HCT 35.6 05/10/2017   MCV 99.4 05/10/2017   PLT 339 05/10/2017      Chemistry      Component Value Date/Time   NA 133 (L) 05/10/2017 0848   K 4.2 05/10/2017 0848   CL 100 (L) 04/14/2017 0507   CO2 27 05/10/2017 0848   BUN 22.6 05/10/2017 0848   CREATININE 0.6 05/10/2017 0848      Component Value Date/Time   CALCIUM 8.8 05/10/2017 0848   ALKPHOS 57 05/10/2017 0848   AST 9 05/10/2017 0848     ALT 15 05/10/2017 0848   BILITOT 0.28 05/10/2017 0848       RADIOGRAPHIC STUDIES: No results found.  ASSESSMENT AND PLAN: This is a very pleasant 71 years old white female with a stage IV non-small cell lung cancer, poorly differentiated carcinoma with SVC syndrome currently undergoing palliative radiotherapy to the right upper lobe lung mass. She continues to tolerate her palliative radiotherapy fairly well but the patient continues to have swelling of the upper extremities.  The swelling is getting worse. I had a lengthy discussion with the patient and her family today about her condition and treatment options.  I recommended for the patient to proceed with the first cycle of her systemic chemotherapy with carboplatin, and paclitaxel today. I will arrange for the patient to have repeat CT scan of the  chest for evaluation of her tumor response to the radiation and also to see if there is any obstructed or occlusion of the SVC or other big venous drainage in the chest area.  I would consider referring the patient to interventional radiology for evaluation if needed based on the scan results. For the swelling of the upper extremities, I will start the patient on Lasix 40 mg p.o. daily.  She was advised to increase her potassium intake in diet. I will see her back for follow-up visit in 1 week for evaluation and discussion of her scan results and management of any adverse effect of her treatment. The patient was advised to call immediately if she has any concerning symptoms in the interval. The patient voices understanding of current disease status and treatment options and is in agreement with the current care plan.  All questions were answered. The patient knows to call the clinic with any problems, questions or concerns. We can certainly see the patient much sooner if necessary.  I spent 15 minutes counseling the patient face to face. The total time spent in the appointment was 25 minutes.  Disclaimer: This note was dictated with voice recognition software. Similar sounding words can inadvertently be transcribed and may not be corrected upon review.

## 2017-05-27 NOTE — Telephone Encounter (Signed)
I was called by patient's infusion nurse to evaluate her mental status change and Taxol leakage.  Patient was seen by her primary oncologist Dr. Myles Gip this morning, and started first cycle of carboplatin and Taxol today through a peripheral left arm vein.  She became confused, more lethargic, and pulled her IV from left arm around 5 PM.  When the nurse was called, there is  significant Taxol infusion fluids leaked underneath her left arm.  She has significant bilateral upper extremity swelling, it is difficult to tell how much subcutaneous leakage from chemo.  She is very drowsy, arousable, disoriented, pulse ox on left finger was in high 70-90% on 3L nasal oxygen.  Lab reviewed, her CO2 was significantly elevated at 37 this morning.  Exam showed significant bilateral wheezing through the lungs. this is concerning for COPD exacerbation and respiratory acidosis.  Her blood pressure is stable.  We called rapid response team, and patient was transferred to [facility] hospital emergency room.  I discussed the above with her husband at bedside.   Truitt Merle  05/27/2017  5:24 PM

## 2017-05-27 NOTE — H&P (Signed)
History and Physical    [name] YPP:[id number] DOB: [birth year] DOA: 05/27/2017  PCP: [name], MD   Patient coming from: Home, by way of [place]   Chief Complaint: Respiratory distress, lethargy   HPI: [name] is a 71 y.o. female with medical history significant for COPD, chronic diastolic CHF, chronic hypoxic respiratory failure, insulin-dependent diabetes mellitus, depression, paroxysmal atrial fibrillation on Eliquis, and stage IV non-small cell lung cancer with SVC syndrome, now presenting from the cancer center for evaluation of acute respiratory distress with hypoxia.  Patient had been receiving her first chemotherapy infusion when she was noted to be lethargic and hypoxic.  She was sent to the ED for further evaluation.  Family reports that her breathing has been increasingly labored in recent days.  Her oxygen saturations have also been lower recently, typically in the upper 80s per report of family.  ED Course: Upon arrival to the ED, patient is found to be afebrile, saturating 77% on 3 L/min of supplemental oxygen, slightly tachycardic, and with stable blood pressure.  EKG features a sinus tachycardia with rate 104 and nonspecific interventricular conduction delay.  Chest x-ray is notable for vascular congestion and small to moderate pleural effusions bilaterally.  Chemistry panel is notable for bicarbonate of 35.  CBC features a microcytic anemia with hemoglobin of 10.7 and MCV of 102.  Patient was placed on BiPAP, given 40 mg IV Lasix, and DuoNeb.  She remained hemodynamically stable in the ED, but continues to require BiPAP, and will be admitted to the stepdown unit for ongoing evaluation and management of acute on chronic respiratory failure suspected secondary to COPD and acute on chronic diastolic CHF.  Review of Systems:  All other systems reviewed and apart from HPI, are negative.  Past Medical History:  Diagnosis Date  . Arthritis   . Asthma   . Cancer  ([place])   . COPD (chronic obstructive pulmonary disease) ([place])   . Depression   . Diabetes mellitus    diet controlled  . History of hiatal hernia   . Hyperlipemia   . Hypertension   . Pneumonia     Past Surgical History:  Procedure Laterality Date  . CARDIAC CATHETERIZATION  1999  . CARPAL TUNNEL RELEASE  2/13   right-GSC  . CARPAL TUNNEL RELEASE  08/15/2011   Procedure: CARPAL TUNNEL RELEASE;  Surgeon: Linna Hoff, MD;  Location: [facility];  Service: Orthopedics;  Laterality: Left;  . CATARACT EXTRACTION    . CERVICAL FUSION  2002  . COLONOSCOPY    . ENDOBRONCHIAL ULTRASOUND Bilateral 03/18/2017   Procedure: ENDOBRONCHIAL ULTRASOUND;  Surgeon: Javier Glazier, MD;  Location: [facility];  Service: Cardiopulmonary;  Laterality: Bilateral;  . TONSILLECTOMY    . VIDEO BRONCHOSCOPY Bilateral 02/25/2017   Procedure: VIDEO BRONCHOSCOPY WITHOUT FLUORO;  Surgeon: Javier Glazier, MD;  Location: [facility];  Service: Cardiopulmonary;  Laterality: Bilateral;     reports that she quit smoking about 2 months ago. She started smoking about 54 years ago. She has a 53.00 pack-year smoking history. she has never used smokeless tobacco. She reports that she does not drink alcohol or use drugs.  Allergies  Allergen Reactions  . Penicillins Hives    Has patient had a PCN reaction causing immediate rash, facial/tongue/throat swelling, SOB or lightheadedness with hypotension: yes Has patient had a PCN reaction causing severe rash involving mucus membranes or skin necrosis: no Has patient had a PCN reaction that required hospitalization: yes  Has patient had a PCN reaction occurring within the last 10 years: no If all of the above answers are "NO", then may proceed with Cephalosporin use.   . Tiotropium Bromide Monohydrate Other (See Comments)    EYE PAIN KIDNEY FUNCTION SLOWED DOWN    Family History  Problem Relation Age of Onset  . Diabetes Mother   . Colon cancer  Father   . Cerebral palsy Brother   . Breast cancer Paternal Aunt   . Breast cancer Cousin   . Prostate cancer Paternal Uncle   . Bone cancer Maternal Aunt      Prior to Admission medications   Medication Sig Start Date End Date Taking? Authorizing Provider  apixaban (ELIQUIS) 5 MG TABS tablet Take 1 tablet by mouth twice daily. 05/09/17  Yes Hilty, Nadean Corwin, MD  citalopram (CELEXA) 20 MG tablet Take 20 mg daily by mouth.    Yes [provider]  diltiazem (CARDIZEM CD) 360 MG 24 hr capsule Take 1 capsule (360 mg total) by mouth daily. 05/09/17 06/08/17 Yes Hilty, Nadean Corwin, MD  furosemide (LASIX) 20 MG tablet Take 2 tablets (40 mg total) by mouth daily. 05/27/17  Yes Curt Bears, MD  glipiZIDE (GLUCOTROL) 5 MG tablet Take 1 tablet (5 mg total) by mouth daily before breakfast. 05/07/17  Yes Hayden Pedro, PA-C  Glycopyrrolate-Formoterol (BEVESPI AEROSPHERE) 9-4.8 MCG/ACT AERO Inhale 2 puffs 2 (two) times daily into the lungs. 04/08/17  Yes Nestor, Sonia Baller, MD  Guaifenesin [facility] MAXIMUM STRENGTH) 1200 MG TB12 Take 1,200 mg by mouth 2 (two) times daily.   Yes [provider]  insulin glargine (LANTUS) 100 UNIT/ML injection Inject 0.1 mLs (10 Units total) at bedtime into the skin. 04/14/17  Yes Elodia Florence., MD  ipratropium-albuterol (DUONEB) 0.5-2.5 (3) MG/3ML SOLN Take 3 mLs by nebulization every 6 (six) hours as needed. Patient taking differently: Take 3 mLs by nebulization every 6 (six) hours as needed (sob and wheezing).  05/17/17  Yes Magdalen Spatz, NP  losartan (COZAAR) 50 MG tablet Take 50 mg by mouth daily. 12/26/16  Yes [provider]  non-metallic deodorant Jethro Poling) MISC Apply 1 application topically. 04/03/17  Yes Hayden Pedro, PA-C  nystatin (MYCOSTATIN) 100000 UNIT/ML suspension Take 5 mLs (500,000 Units total) 3 (three) times daily by mouth. Make sure to swish and swallow. 04/08/17  Yes Javier Glazier, MD    simvastatin (ZOCOR) 40 MG tablet Take 40 mg by mouth every evening.   Yes [provider]  Wound Dressings (SONAFINE) Apply 1 application topically 2 (two) times daily. Apply after rad txs and at bedtimes daily,nothing 4 hours prior to rad tx 03/29/17  Yes Hayden Pedro, PA-C  acetaminophen (TYLENOL) 500 MG tablet Take 500 mg by mouth every 8 (eight) hours as needed for mild pain or headache.    [provider]  blood glucose meter kit and supplies KIT Dispense based on patient and insurance preference. Use up to four times daily as directed. (FOR ICD-9 250.00, 250.01). 04/14/17   Elodia Florence., MD  polyethylene glycol [facility] / Floria Raveling) packet Take 17 g by mouth daily. Patient taking differently: Take 17 g by mouth daily as needed for mild constipation.  02/27/17   Lavina Hamman, MD  prochlorperazine (COMPAZINE) 10 MG tablet Take 1 tablet (10 mg total) by mouth every 6 (six) hours as needed for nausea or vomiting. 05/27/17   Curt Bears, MD  Respiratory Therapy Supplies (FLUTTER) DEVI 1  Device as needed by Does not apply route. 04/08/17   Javier Glazier, MD  Spacer/Aero Chamber Mouthpiece MISC 1 Device by Does not apply route as directed. 03/08/17   Javier Glazier, MD    Physical Exam: Vitals:   05/27/17 1849 05/27/17 2100 05/27/17 2130 05/27/17 2231  BP: 138/71 118/61 124/65 125/66  Pulse: (!) 101 98 97 99  Resp: '16 19 16 16  '$ Temp:      TempSrc:      SpO2: 98% 94% 93% 96%  Weight:      Height:          Constitutional: NAD, calm, on BiPAP  Eyes: PERTLA, lids and conjunctivae normal ENMT: Mucous membranes are moist. Posterior pharynx clear of any exudate or lesions.   Neck: normal, supple, no masses, no thyromegaly Respiratory: Rales and expiratory wheezes bilaterally. No accessory muscle use.  Cardiovascular: S1 & S2 heard, regular rate and rhythm. Marked bilateral UE edema. JVP not well-visualized. Abdomen: No distension, no  tenderness, no masses palpated. Bowel sounds normal.  Musculoskeletal: no clubbing / cyanosis. No joint deformity upper and lower extremities.   Skin: no significant rashes, lesions, ulcers. Warm, dry, well-perfused. Neurologic: No facial asymmetry. Sensation intact. Strength 5/5 in all 4 limbs.  Psychiatric: Somnolent, easily roused and oriented x 3. Pleasant and cooperative.     Labs on Admission: I have personally reviewed following labs and imaging studies  CBC: Recent Labs  Lab 05/27/17 1053  WBC 5.2  NEUTROABS 4.2  HGB 10.7*  HCT 33.0*  MCV 101.2*  PLT 030   Basic Metabolic Panel: Recent Labs  Lab 05/27/17 1053 05/27/17 2201  NA 141 140  K 3.7 4.1  CL  --  99*  CO2 37* 35*  GLUCOSE 165* 158*  BUN 16.5 18  CREATININE 0.5* <0.30*  CALCIUM 8.9 8.4*   GFR: CrCl cannot be calculated (This lab value cannot be used to calculate CrCl because it is not a number: <0.30). Liver Function Tests: Recent Labs  Lab 05/27/17 1053  AST 8  ALT 12  ALKPHOS 57  BILITOT 0.37  PROT 5.6*  ALBUMIN 2.5*   No results for input(s): LIPASE, AMYLASE in the last 168 hours. No results for input(s): AMMONIA in the last 168 hours. Coagulation Profile: No results for input(s): INR, PROTIME in the last 168 hours. Cardiac Enzymes: No results for input(s): CKTOTAL, CKMB, CKMBINDEX, TROPONINI in the last 168 hours. BNP (last 3 results) No results for input(s): PROBNP in the last 8760 hours. HbA1C: No results for input(s): HGBA1C in the last 72 hours. CBG: Recent Labs  Lab 05/27/17 1954  GLUCAP 157*   Lipid Profile: No results for input(s): CHOL, HDL, LDLCALC, TRIG, CHOLHDL, LDLDIRECT in the last 72 hours. Thyroid Function Tests: No results for input(s): TSH, T4TOTAL, FREET4, T3FREE, THYROIDAB in the last 72 hours. Anemia Panel: No results for input(s): VITAMINB12, FOLATE, FERRITIN, TIBC, IRON, RETICCTPCT in the last 72 hours. Urine analysis:    Component Value Date/Time    COLORURINE YELLOW 04/11/2017 [place] 04/11/2017 1221   LABSPEC 1.031 (H) 04/11/2017 1221   PHURINE 5.0 04/11/2017 1221   GLUCOSEU >=500 (A) 04/11/2017 1221   HGBUR NEGATIVE 04/11/2017 1221   BILIRUBINUR NEGATIVE 04/11/2017 1221   KETONESUR 20 (A) 04/11/2017 1221   PROTEINUR NEGATIVE 04/11/2017 1221   NITRITE NEGATIVE 04/11/2017 1221   LEUKOCYTESUR NEGATIVE 04/11/2017 1221   Sepsis Labs: '@LABRCNTIP'$ (procalcitonin:4,lacticidven:4) )No results found for this or any previous visit (from the past  240 hour(s)).   Radiological Exams on Admission: Dg Chest Port 1 View  Result Date: 05/27/2017 CLINICAL DATA:  Dyspnea and lethargy EXAM: PORTABLE CHEST 1 VIEW COMPARISON:  04/24/2017 FINDINGS: Obscuration of the diaphragms consistent with small to moderate bilateral pleural effusions. Low lung volumes with vascular congestion. Aortic atherosclerosis at the arch without definite aneurysm. The heart is partially obscured by the pleural effusions. No acute osseous abnormality. ACDF of the lower cervical spine. IMPRESSION: Low lung volumes with vascular congestion and small to moderate bilateral pleural effusions consistent with stigmata of CHF. Electronically Signed   By: Ashley Royalty M.D.   On: 05/27/2017 22:35    EKG: Independently reviewed. Sinus tachycardia (rate 104), non-specific IVCD.   Assessment/Plan  1. Acute on chronic hypercarbic/hypoxic respiratory failure - Presents from cancer center with lethargy, acute respiratory failure  - Likely multifactorial with contributions from COPD and CHF  - Requiring BiPAP in ED   2. Acute on chronic diastolic CHF  - Presents with hypoxia in upper 70's on her usual 3 Lpm  - Found to have CXR findings consistent with acute CHF  - Treated in ED with Lasix 40 mg IV  - Continue cardiac monitoring, SLIV, follow daily wts and I/O's, continue diuresis with Lasix 40 mg IV q12h, continue ARB, update echo   3. COPD with acute exacerbation  -  Continue ICS/LABA, inhaled anticholinergic, prn nebs, start systemic steroid, continue supplemental O2    4. Stage IV NSCLC; SVC syndrome  - Has been treated with palliative RT to RUL mass  - Underwent first infusion with Taxol and carboplatin on day of presentation, but stopped for transport to ED  - Continue oncology follow-up    5. Paroxysmal atrial fibrillation  - In a sinus rhythm on admission  - CHADS-VASc at least 5 (age, gender, CHF, HTN, DM)  - Continue Eliquis and diltiazem    6. Type II DM  - A1c 10.6% in November '18  - Managed at home with Lantus 10 units qHS and glimepiride  - Check CBG's, continue Lantus 10 units qHS, start SSI with Novolog   7. Hypertension  - BP at goal  - Continue losartan   8. Depression  - Stable, continue Celexa     DVT prophylaxis: Eliquis   Code Status: Full  Family Communication: Husband updated at bedside Disposition Plan: Admit to SDU Consults called: None Admission status: Inpatient    [name], MD [facility] Pager [phone]  If 7PM-7AM, please contact night-coverage [email or web address] Password [facility]  05/27/2017, 11:53 PM

## 2017-05-27 NOTE — Telephone Encounter (Signed)
05/27/17 faxed medical records to [facility] @ [phone]

## 2017-05-27 NOTE — ED Notes (Signed)
Bed: OE70 Expected date: 05/27/17 Expected time: 5:25 PM Means of arrival: Other Comments:

## 2017-05-27 NOTE — Telephone Encounter (Signed)
No 12/31 los at check out - printed out avs and calendar for patient.

## 2017-05-27 NOTE — Patient Instructions (Signed)
[facility] Discharge Instructions for Patients Receiving Chemotherapy  Today you received the following chemotherapy agents Taxol, Carboplatin. To help prevent nausea and vomiting after your treatment, we encourage you to take your nausea medication as directed.  If you develop nausea and vomiting that is not controlled by your nausea medication, call the clinic.   BELOW ARE SYMPTOMS THAT SHOULD BE REPORTED IMMEDIATELY:  *FEVER GREATER THAN 100.5 F  *CHILLS WITH OR WITHOUT FEVER  NAUSEA AND VOMITING THAT IS NOT CONTROLLED WITH YOUR NAUSEA MEDICATION  *UNUSUAL SHORTNESS OF BREATH  *UNUSUAL BRUISING OR BLEEDING  TENDERNESS IN MOUTH AND THROAT WITH OR WITHOUT PRESENCE OF ULCERS  *URINARY PROBLEMS  *BOWEL PROBLEMS  UNUSUAL RASH Items with * indicate a potential emergency and should be followed up as soon as possible.  Feel free to call the clinic should you have any questions or concerns. The clinic phone number is [phone].  Please show the [facility] at check-in to the Emergency Department and triage nurse. Paclitaxel injection What is this medicine? PACLITAXEL (PAK li TAX el) is a chemotherapy drug. It targets fast dividing cells, like cancer cells, and causes these cells to die. This medicine is used to treat ovarian cancer, breast cancer, and other cancers. This medicine may be used for other purposes; ask your health care provider or pharmacist if you have questions. COMMON BRAND NAME(S): Onxol, Taxol What should I tell my health care provider before I take this medicine? They need to know if you have any of these conditions: -blood disorders -irregular heartbeat -infection (especially a virus infection such as chickenpox, cold sores, or herpes) -liver disease -previous or ongoing radiation therapy -an unusual or allergic reaction to paclitaxel, alcohol, polyoxyethylated castor oil, other chemotherapy agents, other medicines, foods, dyes, or  preservatives -pregnant or trying to get pregnant -breast-feeding How should I use this medicine? This drug is given as an infusion into a vein. It is administered in a hospital or clinic by a specially trained health care professional. Talk to your pediatrician regarding the use of this medicine in children. Special care may be needed. Overdosage: If you think you have taken too much of this medicine contact a poison control center or emergency room at once. NOTE: This medicine is only for you. Do not share this medicine with others. What if I miss a dose? It is important not to miss your dose. Call your doctor or health care professional if you are unable to keep an appointment. What may interact with this medicine? Do not take this medicine with any of the following medications: -disulfiram -metronidazole This medicine may also interact with the following medications: -cyclosporine -diazepam -ketoconazole -medicines to increase blood counts like filgrastim, pegfilgrastim, sargramostim -other chemotherapy drugs like cisplatin, doxorubicin, epirubicin, etoposide, teniposide, vincristine -quinidine -testosterone -vaccines -verapamil Talk to your doctor or health care professional before taking any of these medicines: -acetaminophen -aspirin -ibuprofen -ketoprofen -naproxen This list may not describe all possible interactions. Give your health care provider a list of all the medicines, herbs, non-prescription drugs, or dietary supplements you use. Also tell them if you smoke, drink alcohol, or use illegal drugs. Some items may interact with your medicine. What should I watch for while using this medicine? Your condition will be monitored carefully while you are receiving this medicine. You will need important blood work done while you are taking this medicine. This medicine can cause serious allergic reactions. To reduce your risk you will need to take other medicine(s)  before  treatment with this medicine. If you experience allergic reactions like skin rash, itching or hives, swelling of the face, lips, or tongue, tell your doctor or health care professional right away. In some cases, you may be given additional medicines to help with side effects. Follow all directions for their use. This drug may make you feel generally unwell. This is not uncommon, as chemotherapy can affect healthy cells as well as cancer cells. Report any side effects. Continue your course of treatment even though you feel ill unless your doctor tells you to stop. Call your doctor or health care professional for advice if you get a fever, chills or sore throat, or other symptoms of a cold or flu. Do not treat yourself. This drug decreases your body's ability to fight infections. Try to avoid being around people who are sick. This medicine may increase your risk to bruise or bleed. Call your doctor or health care professional if you notice any unusual bleeding. Be careful brushing and flossing your teeth or using a toothpick because you may get an infection or bleed more easily. If you have any dental work done, tell your dentist you are receiving this medicine. Avoid taking products that contain aspirin, acetaminophen, ibuprofen, naproxen, or ketoprofen unless instructed by your doctor. These medicines may hide a fever. Do not become pregnant while taking this medicine. Women should inform their doctor if they wish to become pregnant or think they might be pregnant. There is a potential for serious side effects to an unborn child. Talk to your health care professional or pharmacist for more information. Do not breast-feed an infant while taking this medicine. Men are advised not to father a child while receiving this medicine. This product may contain alcohol. Ask your pharmacist or healthcare provider if this medicine contains alcohol. Be sure to tell all healthcare providers you are taking this medicine.  Certain medicines, like metronidazole and disulfiram, can cause an unpleasant reaction when taken with alcohol. The reaction includes flushing, headache, nausea, vomiting, sweating, and increased thirst. The reaction can last from 30 minutes to several hours. What side effects may I notice from receiving this medicine? Side effects that you should report to your doctor or health care professional as soon as possible: -allergic reactions like skin rash, itching or hives, swelling of the face, lips, or tongue -low blood counts - This drug may decrease the number of white blood cells, red blood cells and platelets. You may be at increased risk for infections and bleeding. -signs of infection - fever or chills, cough, sore throat, pain or difficulty passing urine -signs of decreased platelets or bleeding - bruising, pinpoint red spots on the skin, black, tarry stools, nosebleeds -signs of decreased red blood cells - unusually weak or tired, fainting spells, lightheadedness -breathing problems -chest pain -high or low blood pressure -mouth sores -nausea and vomiting -pain, swelling, redness or irritation at the injection site -pain, tingling, numbness in the hands or feet -slow or irregular heartbeat -swelling of the ankle, feet, hands Side effects that usually do not require medical attention (report to your doctor or health care professional if they continue or are bothersome): -bone pain -complete hair loss including hair on your head, underarms, pubic hair, eyebrows, and eyelashes -changes in the color of fingernails -diarrhea -loosening of the fingernails -loss of appetite -muscle or joint pain -red flush to skin -sweating This list may not describe all possible side effects. Call your doctor for medical advice about side effects. You  may report side effects to FDA at 1-800-FDA-1088. Where should I keep my medicine? This drug is given in a hospital or clinic and will not be stored at  home. NOTE: This sheet is a summary. It may not cover all possible information. If you have questions about this medicine, talk to your doctor, pharmacist, or health care provider.  2018 [employer]/Gold Standard (2015-03-15 19:58:00) Carboplatin injection What is this medicine? CARBOPLATIN (KAR boe pla tin) is a chemotherapy drug. It targets fast dividing cells, like cancer cells, and causes these cells to die. This medicine is used to treat ovarian cancer and many other cancers. This medicine may be used for other purposes; ask your health care provider or pharmacist if you have questions. COMMON BRAND NAME(S): Paraplatin What should I tell my health care provider before I take this medicine? They need to know if you have any of these conditions: -blood disorders -hearing problems -kidney disease -recent or ongoing radiation therapy -an unusual or allergic reaction to carboplatin, cisplatin, other chemotherapy, other medicines, foods, dyes, or preservatives -pregnant or trying to get pregnant -breast-feeding How should I use this medicine? This drug is usually given as an infusion into a vein. It is administered in a hospital or clinic by a specially trained health care professional. Talk to your pediatrician regarding the use of this medicine in children. Special care may be needed. Overdosage: If you think you have taken too much of this medicine contact a poison control center or emergency room at once. NOTE: This medicine is only for you. Do not share this medicine with others. What if I miss a dose? It is important not to miss a dose. Call your doctor or health care professional if you are unable to keep an appointment. What may interact with this medicine? -medicines for seizures -medicines to increase blood counts like filgrastim, pegfilgrastim, sargramostim -some antibiotics like amikacin, gentamicin, neomycin, streptomycin, tobramycin -vaccines Talk to your doctor or health care  professional before taking any of these medicines: -acetaminophen -aspirin -ibuprofen -ketoprofen -naproxen This list may not describe all possible interactions. Give your health care provider a list of all the medicines, herbs, non-prescription drugs, or dietary supplements you use. Also tell them if you smoke, drink alcohol, or use illegal drugs. Some items may interact with your medicine. What should I watch for while using this medicine? Your condition will be monitored carefully while you are receiving this medicine. You will need important blood work done while you are taking this medicine. This drug may make you feel generally unwell. This is not uncommon, as chemotherapy can affect healthy cells as well as cancer cells. Report any side effects. Continue your course of treatment even though you feel ill unless your doctor tells you to stop. In some cases, you may be given additional medicines to help with side effects. Follow all directions for their use. Call your doctor or health care professional for advice if you get a fever, chills or sore throat, or other symptoms of a cold or flu. Do not treat yourself. This drug decreases your body's ability to fight infections. Try to avoid being around people who are sick. This medicine may increase your risk to bruise or bleed. Call your doctor or health care professional if you notice any unusual bleeding. Be careful brushing and flossing your teeth or using a toothpick because you may get an infection or bleed more easily. If you have any dental work done, tell your dentist you are receiving this medicine.  Avoid taking products that contain aspirin, acetaminophen, ibuprofen, naproxen, or ketoprofen unless instructed by your doctor. These medicines may hide a fever. Do not become pregnant while taking this medicine. Women should inform their doctor if they wish to become pregnant or think they might be pregnant. There is a potential for serious side  effects to an unborn child. Talk to your health care professional or pharmacist for more information. Do not breast-feed an infant while taking this medicine. What side effects may I notice from receiving this medicine? Side effects that you should report to your doctor or health care professional as soon as possible: -allergic reactions like skin rash, itching or hives, swelling of the face, lips, or tongue -signs of infection - fever or chills, cough, sore throat, pain or difficulty passing urine -signs of decreased platelets or bleeding - bruising, pinpoint red spots on the skin, black, tarry stools, nosebleeds -signs of decreased red blood cells - unusually weak or tired, fainting spells, lightheadedness -breathing problems -changes in hearing -changes in vision -chest pain -high blood pressure -low blood counts - This drug may decrease the number of white blood cells, red blood cells and platelets. You may be at increased risk for infections and bleeding. -nausea and vomiting -pain, swelling, redness or irritation at the injection site -pain, tingling, numbness in the hands or feet -problems with balance, talking, walking -trouble passing urine or change in the amount of urine Side effects that usually do not require medical attention (report to your doctor or health care professional if they continue or are bothersome): -hair loss -loss of appetite -metallic taste in the mouth or changes in taste This list may not describe all possible side effects. Call your doctor for medical advice about side effects. You may report side effects to FDA at 1-800-FDA-1088. Where should I keep my medicine? This drug is given in a hospital or clinic and will not be stored at home. NOTE: This sheet is a summary. It may not cover all possible information. If you have questions about this medicine, talk to your doctor, pharmacist, or health care provider.  2018 [employer]/Gold Standard (2007-08-19  14:38:05)

## 2017-05-27 NOTE — Progress Notes (Signed)
@   1641, patient's husband asked this RN to "look at my wife's IV."  When I arrived to patient, IV was no longer in arm and chemotherapy was still infusing. Infusion stoppedNoted wet towel under patient's left arm. Amount of fluid in towel unknown. Patient's arms weeping, but husband states this is per norm. Unable to ascertain if there was a definite extravasation. Left arm elevated and ice pack applied. Found patient to have AMS. Opened eyes to verbal, but had no verbal response. [facility] called and Dr. Burr Medico came to see patient in infusion room. Patient taken to ED for further evaluation and treatment. Report given to Ronalee Belts, RN to include information regarding questionable extravasation of paclitaxel left forearm. Reviewed with Ronalee Belts, RN to keep extremity elevated and apply cold packs to affected area for 15-20 mins four times daily. Verbalized understanding. Care turned over to ED.

## 2017-05-27 NOTE — ED Provider Notes (Addendum)
[facility] DEPT Provider Note   CSN: [id number] Arrival date & time: 05/27/17  1725     History   Chief Complaint Chief Complaint  Patient presents with  . Altered Mental Status    HPI [name] is a 71 y.o. female.  HPI She has advanced stage IV lung cancer.  She has been getting palliative radiation therapy and has just started chemotherapy.  She was at the cancer center getting Taxol and carboplatin infusion.  He was noted to get lethargic and confused.  Reportedly her IV had come out and the nurse noted a lot of leakage under her arm.  They were uncertain if she had significant infiltrate or not.  Patient's husband reports that she has chronically had severe edema of both upper extremities and they do leak clear fluid spontaneously.  He has not sure that this is necessarily the medication that had leaked versus the spontaneous drainage from edema of her arms.  He does have a report that she has been more somnolent than normal.  He reports that she does have to be immobile quite a bit due to breathing difficulty.  He reports she sleeps but then wakes up easily.  He reports today she has been falling asleep repeatedly and slower to awaken.  At baseline she uses 2-1/2 L nasal cannula at home and he reports that her oxygen saturation is high 80s.  Husband has concern that she has not had anything to eat all day and she may be hypoglycemic. Past Medical History:  Diagnosis Date  . Arthritis   . Asthma   . Cancer ([place])   . COPD (chronic obstructive pulmonary disease) ([place])   . Depression   . Diabetes mellitus    diet controlled  . History of hiatal hernia   . Hyperlipemia   . Hypertension   . Pneumonia     Patient Active Problem List   Diagnosis Date Noted  . Encounter for antineoplastic chemotherapy 04/23/2017  . Acute kidney injury ([place])   . Dehydration   . PAF (paroxysmal atrial fibrillation) ([place]) 04/11/2017  . Atrial fibrillation with  RVR ([place]) 04/11/2017  . Hyperglycemia 04/11/2017  . Malignant neoplasm of bronchus of right upper lobe ([place]) 04/08/2017  . Oral thrush 04/08/2017  . 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 Throat swelling 03/13/2017  . Blurry vision, left eye 03/13/2017  . 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 Acute respiratory failure with hypoxia ([place]) 03/08/2017  . Mass of right lung 03/07/2017  . Postobstructive pneumonia 02/23/2017  . Tobacco use disorder 02/23/2017  . Chronic obstructive pulmonary disease ([place]) 02/23/2017  . Hyperlipemia 02/23/2017  . Essential hypertension 02/23/2017  . Depression 02/23/2017  . Type 2 diabetes mellitus ([place]) 02/23/2017  . Lung mass 02/23/2017    Past Surgical History:  Procedure Laterality Date  . CARDIAC CATHETERIZATION  1999  . CARPAL TUNNEL RELEASE  2/13   right-[facility]SC  . CARPAL TUNNEL RELEASE  08/15/2011   Procedure: CARPAL TUNNEL RELEASE;  Surgeon: Linna Hoff, MD;  Location: [facility];  Service: Orthopedics;  Laterality: Left;  . CATARACT EXTRACTION    . CERVICAL FUSION  2002  . COLONOSCOPY    . ENDOBRONCHIAL ULTRASOUND Bilateral 03/18/2017   Procedure: ENDOBRONCHIAL ULTRASOUND;  Surgeon: Javier Glazier, MD;  Location: [facility];  Service: Cardiopulmonary;  Laterality: Bilateral;  . TONSILLECTOMY    . VIDEO BRONCHOSCOPY Bilateral 02/25/2017   Procedure: VIDEO BRONCHOSCOPY WITHOUT  FLUORO;  Surgeon: Javier Glazier, MD;  Location: [facility];  Service: Cardiopulmonary;  Laterality: Bilateral;    OB History    No data available       Home Medications    Prior to Admission medications   Medication Sig Start Date End Date Taking? Authorizing Provider  apixaban (ELIQUIS) 5 MG TABS tablet Take 1 tablet by mouth twice daily. 05/09/17  Yes Hilty, Nadean Corwin, MD  citalopram (CELEXA) 20 MG tablet Take 20 mg daily by mouth.    Yes [provider]  diltiazem (CARDIZEM CD) 360 MG 24 hr  capsule Take 1 capsule (360 mg total) by mouth daily. 05/09/17 06/08/17 Yes Hilty, Nadean Corwin, MD  furosemide (LASIX) 20 MG tablet Take 2 tablets (40 mg total) by mouth daily. 05/27/17  Yes Curt Bears, MD  glipiZIDE (GLUCOTROL) 5 MG tablet Take 1 tablet (5 mg total) by mouth daily before breakfast. 05/07/17  Yes Hayden Pedro, PA-C  Glycopyrrolate-Formoterol (BEVESPI AEROSPHERE) 9-4.8 MCG/ACT AERO Inhale 2 puffs 2 (two) times daily into the lungs. 04/08/17  Yes Nestor, Sonia Baller, MD  Guaifenesin [facility] MAXIMUM STRENGTH) 1200 MG TB12 Take 1,200 mg by mouth 2 (two) times daily.   Yes [provider]  insulin glargine (LANTUS) 100 UNIT/ML injection Inject 0.1 mLs (10 Units total) at bedtime into the skin. 04/14/17  Yes Elodia Florence., MD  ipratropium-albuterol (DUONEB) 0.5-2.5 (3) MG/3ML SOLN Take 3 mLs by nebulization every 6 (six) hours as needed. Patient taking differently: Take 3 mLs by nebulization every 6 (six) hours as needed (sob and wheezing).  05/17/17  Yes Magdalen Spatz, NP  losartan (COZAAR) 50 MG tablet Take 50 mg by mouth daily. 12/26/16  Yes [provider]  non-metallic deodorant Jethro Poling) MISC Apply 1 application topically. 04/03/17  Yes Hayden Pedro, PA-C  nystatin (MYCOSTATIN) 100000 UNIT/ML suspension Take 5 mLs (500,000 Units total) 3 (three) times daily by mouth. Make sure to swish and swallow. 04/08/17  Yes Javier Glazier, MD  simvastatin (ZOCOR) 40 MG tablet Take 40 mg by mouth every evening.   Yes [provider]  Wound Dressings (SONAFINE) Apply 1 application topically 2 (two) times daily. Apply after rad txs and at bedtimes daily,nothing 4 hours prior to rad tx 03/29/17  Yes Hayden Pedro, PA-C  acetaminophen (TYLENOL) 500 MG tablet Take 500 mg by mouth every 8 (eight) hours as needed for mild pain or headache.    [provider]  blood glucose meter kit and supplies KIT Dispense based on patient and  insurance preference. Use up to four times daily as directed. (FOR ICD-9 250.00, 250.01). 04/14/17   Elodia Florence., MD  methylPREDNISolone (MEDROL DOSEPAK) 4 MG TBPK tablet FPD 05/07/17   [provider]  polyethylene glycol (MIRALAX / GLYCOLAX) packet Take 17 g by mouth daily. Patient taking differently: Take 17 g by mouth daily as needed for mild constipation.  02/27/17   Lavina Hamman, MD  prochlorperazine (COMPAZINE) 10 MG tablet Take 1 tablet (10 mg total) by mouth every 6 (six) hours as needed for nausea or vomiting. 05/27/17   Curt Bears, MD  Respiratory Therapy Supplies (FLUTTER) DEVI 1 Device as needed by Does not apply route. 04/08/17   Javier Glazier, MD  Spacer/Aero Chamber Mouthpiece MISC 1 Device by Does not apply route as directed. 03/08/17   Javier Glazier, MD    Family History Family History  Problem Relation Age of Onset  .  Diabetes Mother   . Colon cancer Father   . Cerebral palsy Brother   . Breast cancer Paternal Aunt   . Breast cancer Cousin   . Prostate cancer Paternal Uncle   . Bone cancer Maternal Aunt     Social History Social History   Tobacco Use  . Smoking status: Former Smoker    Packs/day: 1.00    Years: 53.00    Pack years: 53.00    Start date: 12/22/1962    Last attempt to quit: 02/25/2017    Years since quitting: 0.2  . Smokeless tobacco: Never Used  . Tobacco comment: Peak rate 1.5ppd - quit at most 6 months  Substance Use Topics  . Alcohol use: No  . Drug use: No     Allergies   Penicillins and Tiotropium bromide monohydrate   Review of Systems Review of Systems 10 Systems reviewed and are negative for acute change except as noted in the HPI.   Physical Exam Updated Vital Signs BP 125/66   Pulse 99   Temp 97.6 F (36.4 C) (Oral)   Resp 16   Ht 5' (1.524 m)   Wt 57.2 kg (126 lb)   SpO2 96%   BMI 24.61 kg/m   Physical Exam  Constitutional:  Patient is very deconditioned in appearance.  She  is nontoxic.  She sleeps but when awake is situationally oriented and answers simple questions.  HENT:  Head: Normocephalic and atraumatic.  Mouth/Throat: Oropharynx is clear and moist.  Eyes: EOM are normal.  Neck:  Patient has positive JVD.  Cardiovascular:  Borderline tachycardia regular distant heart sounds.  Pulmonary/Chest:  Breath sounds very soft at the bases.  Occasional wheeze.  Abdominal: Soft. She exhibits no distension. There is no tenderness.  Musculoskeletal:  Upper extremities have large edema bilaterally.  Some spontaneous clear exudate.  Husband points out that the IV infusion was in the left antecubital fossa.  Patient reports she does not perceive any additional pain in that area and the amount of swelling and redness (which is impressive) appears baseline to them.  Lower extremities, no peripheral edema calves are soft and nontender.  Skin condition good.  Neurological:  Patient falls asleep easily but awakens to moderate stimulus.  When awake she talks for a while and then falls back asleep again.  No focal neurologic deficits.  Skin: Skin is warm and dry. There is pallor.  Psychiatric: She has a normal mood and affect.     ED Treatments / Results  Labs (all labs ordered are listed, but only abnormal results are displayed) Labs Reviewed  BLOOD GAS, VENOUS - Abnormal; Notable for the following components:      Result Value   pCO2, Ven 73.0 (*)    pO2, Ven 49.4 (*)    Bicarbonate 35.7 (*)    Acid-Base Excess 7.8 (*)    All other components within normal limits  BASIC METABOLIC PANEL - Abnormal; Notable for the following components:   Chloride 99 (*)    CO2 35 (*)    Glucose, Bld 158 (*)    Creatinine, Ser <0.30 (*)    Calcium 8.4 (*)    All other components within normal limits  CBG MONITORING, ED - Abnormal; Notable for the following components:   Glucose-Capillary 157 (*)    All other components within normal limits    EKG  EKG  Interpretation None       Radiology Dg Chest Port 1 View  Result Date: 05/27/2017 CLINICAL DATA:  Dyspnea and lethargy EXAM: PORTABLE CHEST 1 VIEW COMPARISON:  04/24/2017 FINDINGS: Obscuration of the diaphragms consistent with small to moderate bilateral pleural effusions. Low lung volumes with vascular congestion. Aortic atherosclerosis at the arch without definite aneurysm. The heart is partially obscured by the pleural effusions. No acute osseous abnormality. ACDF of the lower cervical spine. IMPRESSION: Low lung volumes with vascular congestion and small to moderate bilateral pleural effusions consistent with stigmata of CHF. Electronically Signed   By: Ashley Royalty M.D.   On: 05/27/2017 22:35    Procedures Procedures (including critical care time) CRITICAL CARE Performed by: Si Gaul   Total critical care time: 30 minutes  Critical care time was exclusive of separately billable procedures and treating other patients.  Critical care was necessary to treat or prevent imminent or life-threatening deterioration.  Critical care was time spent personally by me on the following activities: development of treatment plan with patient and/or surrogate as well as nursing, discussions with consultants, evaluation of patient's response to treatment, examination of patient, obtaining history from patient or surrogate, ordering and performing treatments and interventions, ordering and review of laboratory studies, ordering and review of radiographic studies, pulse oximetry and re-evaluation of patient's condition. Medications Ordered in ED Medications  furosemide (LASIX) injection 40 mg (not administered)  ipratropium-albuterol (DUONEB) 0.5-2.5 (3) MG/3ML nebulizer solution 3 mL (not administered)     Initial Impression / Assessment and Plan / ED Course  I have reviewed the triage vital signs and the nursing notes.  Pertinent labs & imaging results that were available during my care  of the patient were reviewed by me and considered in my medical decision making (see chart for details).     Final Clinical Impressions(s) / ED Diagnoses   Final diagnoses:  Acute on chronic respiratory failure with hypoxia and hypercapnia (HCC)  Severe comorbid illness  Patient has severe comorbid illness of advanced lung cancer.  She had an acute change at the infusion center today and was brought to the emergency department.  Patient is more somnolent than baseline.  At this time hypercapnia appears likely etiology based on venous pH and patient's appearance.  Patient has history of significant COPD and lung cancer.  Also chest x-ray suggests vascular congestion.  She does take Lasix at baseline thus IV dose of 40 Lasix administered as well.  Most recent echo however does not suggest significant CHF and patient does not have significant lower extremity edema.  Patient has been placed on BiPAP.  She is tolerating this well.  Patient does not have any difficulty with managing secretions.  Remains somnolent but awakens to stimulus.  At this time, I have low suspicion for actual complications of her infusion therapy.  She does not have inordinate pain in the area of infusion and does not perceive a change in terms of swelling and erythema.  ED Discharge Orders    None       Charlesetta Shanks, MD 05/27/17 2340    Charlesetta Shanks, MD 05/27/17 [phone]

## 2017-05-27 NOTE — ED Notes (Signed)
PO SUPPLEMENTS NOT GIVEN DUE PT SLEEPING. AROUSED TO VOICE, BUT GOES BACK TO SLEEP QUICKLY. DR. Johnney Killian MADE AWARE.

## 2017-05-28 ENCOUNTER — Inpatient Hospital Stay (HOSPITAL_COMMUNITY): Payer: Medicare Other

## 2017-05-28 ENCOUNTER — Other Ambulatory Visit: Payer: Self-pay

## 2017-05-28 DIAGNOSIS — I480 Paroxysmal atrial fibrillation: Secondary | ICD-10-CM

## 2017-05-28 LAB — ECHOCARDIOGRAM COMPLETE
HEIGHTINCHES: 60 in
WEIGHTICAEL: 1918.88 [oz_av]

## 2017-05-28 LAB — GLUCOSE, CAPILLARY
GLUCOSE-CAPILLARY: 150 mg/dL — AB (ref 65–99)
GLUCOSE-CAPILLARY: 149 mg/dL — AB (ref 65–99)
Glucose-Capillary: 168 mg/dL — ABNORMAL HIGH (ref 65–99)
Glucose-Capillary: 275 mg/dL — ABNORMAL HIGH (ref 65–99)
Glucose-Capillary: 342 mg/dL — ABNORMAL HIGH (ref 65–99)

## 2017-05-28 LAB — BASIC METABOLIC PANEL
ANION GAP: 7 (ref 5–15)
BUN: 18 mg/dL (ref 6–20)
CO2: 35 mmol/L — ABNORMAL HIGH (ref 22–32)
CREATININE: 0.4 mg/dL — AB (ref 0.44–1.00)
Calcium: 8.2 mg/dL — ABNORMAL LOW (ref 8.9–10.3)
Chloride: 97 mmol/L — ABNORMAL LOW (ref 101–111)
GFR calc non Af Amer: >60 mL/min (ref >60)
Glucose, Bld: 181 mg/dL — ABNORMAL HIGH (ref 65–99)
Potassium: 3.8 mmol/L (ref 3.5–5.1)
SODIUM: 139 mmol/L (ref 135–145)

## 2017-05-28 LAB — CBG MONITORING, ED: Glucose-Capillary: 149 mg/dL — ABNORMAL HIGH (ref 65–99)

## 2017-05-28 MED ORDER — METHYLPREDNISOLONE SODIUM SUCC 40 MG IJ SOLR
40.0000 mg | INTRAMUSCULAR | Status: DC
  Administered 2017-05-29 – 2017-05-30 (×2): 40 mg via INTRAVENOUS
  Filled 2017-05-28 (×2): qty 1

## 2017-05-28 MED ORDER — METHYLPREDNISOLONE SODIUM SUCC 125 MG IJ SOLR
INTRAMUSCULAR | Status: AC
  Filled 2017-05-28: qty 2

## 2017-05-28 MED ORDER — ATORVASTATIN CALCIUM 20 MG PO TABS
20.0000 mg | ORAL_TABLET | Freq: Every day | ORAL | Status: DC
  Administered 2017-05-28 – 2017-06-07 (×11): 20 mg via ORAL
  Filled 2017-05-28 (×2): qty 2
  Filled 2017-05-28: qty 1
  Filled 2017-05-28 (×2): qty 2
  Filled 2017-05-28: qty 1
  Filled 2017-05-28: qty 2
  Filled 2017-05-28: qty 1
  Filled 2017-05-28: qty 2
  Filled 2017-05-28: qty 1
  Filled 2017-05-28: qty 2

## 2017-05-28 MED ORDER — METHYLPREDNISOLONE SODIUM SUCC 40 MG IJ SOLR
40.0000 mg | Freq: Three times a day (TID) | INTRAMUSCULAR | Status: DC
  Administered 2017-05-28: 40 mg via INTRAVENOUS
  Filled 2017-05-28: qty 1

## 2017-05-28 MED ORDER — FUROSEMIDE 10 MG/ML IJ SOLN
40.0000 mg | Freq: Every day | INTRAMUSCULAR | Status: DC
  Administered 2017-05-29 – 2017-06-04 (×7): 40 mg via INTRAVENOUS
  Filled 2017-05-28 (×9): qty 4

## 2017-05-28 NOTE — Progress Notes (Signed)
  Echocardiogram 2D Echocardiogram has been performed.  [name] T Ryott Rafferty 05/28/2017, 9:54 AM

## 2017-05-28 NOTE — Progress Notes (Signed)
Patient placed on 12 L [place]. Patient is labored on [place], but says she is okay at this time. Encouraged to call if she feels she needs BiPAP again. BiPAP on standby at bedside. RT will continue to monitor patient.

## 2017-05-28 NOTE — Progress Notes (Signed)
Pt transported to ICU on BIPAP without complication.  RT to monitor and assess as needed.

## 2017-05-28 NOTE — Progress Notes (Signed)
PROGRESS NOTE    [name]  CWC:[id number] DOB: 05/19/1946 DOA: 05/27/2017 PCP: [name], MD   Brief Narrative: Patient is a 72 year old female with past medical history of COPD, chronic diastolic CHF, chronic hypoxic respiratory failure, insulin-dependent diabetes, depression, paroxysmal A. 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  Patient had been receiving her  chemotherapy infusion when she became lethargic and hypoxic and sent to emergency department for further evaluation.  Chest x-ray on admission was suggestive of exacerbation of CHF and she has been currently managed for that.  Assessment & Plan:   Principal Problem:   Acute on chronic diastolic CHF (congestive heart failure) (HCC) Active Problems:   Type 2 diabetes mellitus (HCC)   Acute on chronic respiratory failure with hypoxia and hypercapnia (HCC)   SVC syndrome   Malignant neoplasm of bronchus of right upper lobe (HCC)   PAF (paroxysmal atrial fibrillation) (HCC)  Acute on chronic hypercarbic/hypoxic respiratory failure: Likely contributed by CHF and COPD.  Off of BiPAP today.    CHF exacerbation: Chest x-ray on admission showed low lung volumes with vascular congestion and small to moderate bilateral pleural effusions consistent with stigmata of CHF. Was started on Lasix IV twice a day.  She does not lower extremity edema but has some edema in the upper extremities.  Auscultation today did not reveal any crackles. Lasix tapered to IV 40 mg once a day.  She is on Lasix 20 mg at home. Echocardiogram has been done.  We will follow the final report.  COPD: Continue inhalers and bronchodilators.  Not wheezing during my evaluation today.  Start on steroids IV which has been tapered to once a day.  Continue supplemental oxygen as needed.  Stage IV non-small cell lung cancer: Follows with  Dr. Julien Nordmann.  Was on chemotherapy.  Status post palliative radiotherapy.  Has history of SVC syndrome.  Became hypoxic during her infusion with Taxol and carboplatin.  She will follow-up with oncology as an outpatient.  Paroxysmal A. fib: Currently rate is controlled.  On Eliquis and Cardizem.  Type 2 diabetes mellitus: Insulin at home.  We will continue with here.  Hemoglobin A1c in November 2018 was 10.6. We will continue to monitor her blood glucose.  Hypertension: Currently blood pressure stable.  Continue losartan.  Depression: Mood is stable.  Continue Celexa     DVT prophylaxis:Eliquis Code Status: Full Family Communication: Husband present at the bedside Disposition Plan: Home   Consultants: None  Procedures:echo  Antimicrobials:None  Subjective: Patient seen and examined at the bedside this morning.  Appears more comfortable today.  Off BiPAP.  Mildly tachycardiac.  Objective: Vitals:   05/28/17 0700 05/28/17 0800 05/28/17 0934 05/28/17 1000  BP: (!) 112/41 (!) 126/31  (!) 112/26  Pulse: 68 (!) 108 100 (!) 110  Resp: 14 (!) 35 (!) 33 (!) 32  Temp:  97.7 F (36.5 C)    TempSrc:  Axillary    SpO2: 100% 100% 100% 100%  Weight:      Height:        Intake/Output Summary (Last 24 hours) at 05/28/2017 1102 Last data filed at 05/28/2017 0145 Gross per 24 hour  Intake -  Output 725 ml  Net -725 ml   Filed Weights   05/27/17 1730 05/28/17 0530  Weight: 57.2 kg (126 lb) 54.4 kg (119 lb 14.9 oz)    Examination:  General exam: Appears calm  ,In mild respiratory distress,average built Respiratory system: Bilateral decreased air entry mainly on the bases  cardiovascular system: S1 & S2 heard, RRR. No JVD, murmurs, rubs, gallops or clicks. No pedal edema. Gastrointestinal system: Abdomen is nondistended, soft and nontender. No organomegaly or masses felt. Normal bowel sounds heard. Central nervous system: Alert and oriented. No focal neurological  deficits. Extremities: No edema on lower extremities, mild edema in the upper extremities, no clubbing ,no cyanosis, distal peripheral pulses palpable. Skin: No rashes, lesions or ulcers,no icterus ,no pallor Psychiatry: Judgement and insight appear normal. Mood & affect appropriate.     Data Reviewed: I have personally reviewed following labs and imaging studies  CBC: Recent Labs  Lab 05/27/17 1053  WBC 5.2  NEUTROABS 4.2  HGB 10.7*  HCT 33.0*  MCV 101.2*  PLT 130   Basic Metabolic Panel: Recent Labs  Lab 05/27/17 1053 05/27/17 2201 05/28/17 0357  NA 141 140 139  K 3.7 4.1 3.8  CL  --  99* 97*  CO2 37* 35* 35*  GLUCOSE 165* 158* 181*  BUN 16.5 18 18   CREATININE 0.5* <0.30* 0.40*  CALCIUM 8.9 8.4* 8.2*   GFR: Estimated Creatinine Clearance: 47.7 mL/min (A) (by C-G formula based on SCr of 0.4 mg/dL (L)). Liver Function Tests: Recent Labs  Lab 05/27/17 1053  AST 8  ALT 12  ALKPHOS 57  BILITOT 0.37  PROT 5.6*  ALBUMIN 2.5*   No results for input(s): LIPASE, AMYLASE in the last 168 hours. No results for input(s): AMMONIA in the last 168 hours. Coagulation Profile: No results for input(s): INR, PROTIME in the last 168 hours. Cardiac Enzymes: No results for input(s): CKTOTAL, CKMB, CKMBINDEX, TROPONINI in the last 168 hours. BNP (last 3 results) No results for input(s): PROBNP in the last 8760 hours. HbA1C: No results for input(s): HGBA1C in the last 72 hours. CBG: Recent Labs  Lab 05/27/17 1954 05/28/17 0028 05/28/17 0803 05/28/17 1040  GLUCAP 157* 149* 168* 150*   Lipid Profile: No results for input(s): CHOL, HDL, LDLCALC, TRIG, CHOLHDL, LDLDIRECT in the last 72 hours. Thyroid Function Tests: No results for input(s): TSH, T4TOTAL, FREET4, T3FREE, THYROIDAB in the last 72 hours. Anemia Panel: No results for input(s): VITAMINB12, FOLATE, FERRITIN, TIBC, IRON, RETICCTPCT in the last 72 hours. Sepsis Labs: No results for input(s): PROCALCITON,  LATICACIDVEN in the last 168 hours.  No results found for this or any previous visit (from the past 240 hour(s)).       Radiology Studies: Dg Chest Port 1 View  Result Date: 05/27/2017 CLINICAL DATA:  Dyspnea and lethargy EXAM: PORTABLE CHEST 1 VIEW COMPARISON:  04/24/2017 FINDINGS: Obscuration of the diaphragms consistent with small to moderate bilateral pleural effusions. Low lung volumes with vascular congestion. Aortic atherosclerosis at the arch without definite aneurysm. The heart is partially obscured by the pleural effusions. No acute osseous abnormality. ACDF of the lower cervical spine. IMPRESSION: Low lung volumes with vascular congestion and small to moderate bilateral pleural effusions consistent with stigmata of CHF. Electronically Signed   By: Ashley Royalty M.D.   On: 05/27/2017 22:35        Scheduled Meds: . apixaban  5 mg Oral BID  . arformoterol  15 mcg Nebulization BID  . atorvastatin  20 mg Oral q1800  . citalopram  20 mg Oral Daily  . diltiazem  360 mg Oral Daily  . furosemide  40 mg Intravenous Q12H  . guaiFENesin  1,200 mg Oral BID  .  insulin aspart  0-5 Units Subcutaneous QHS  . insulin aspart  0-9 Units Subcutaneous TID WC  . insulin glargine  10 Units Subcutaneous QHS  . losartan  50 mg Oral Daily  . methylPREDNISolone (SOLU-MEDROL) injection  40 mg Intravenous Q8H  . nystatin  5 mL Oral TID  . sodium chloride flush  3 mL Intravenous Q12H  . umeclidinium bromide  1 puff Inhalation Daily   Continuous Infusions: . sodium chloride       LOS: 1 day    Time spent: 25 minutes    Jenniferlynn Saad Jodie Echevaria, MD [facility] Pager [phone]  If 7PM-7AM, please contact night-coverage [email or web address] Password [email or web address] 05/28/2017, 11:02 AM

## 2017-05-29 ENCOUNTER — Ambulatory Visit: Payer: Medicare Other

## 2017-05-29 LAB — BASIC METABOLIC PANEL
Anion gap: 7 (ref 5–15)
BUN: 29 mg/dL — AB (ref 6–20)
CO2: 38 mmol/L — ABNORMAL HIGH (ref 22–32)
Calcium: 8.3 mg/dL — ABNORMAL LOW (ref 8.9–10.3)
Chloride: 93 mmol/L — ABNORMAL LOW (ref 101–111)
Creatinine, Ser: 0.38 mg/dL — ABNORMAL LOW (ref 0.44–1.00)
GFR calc Af Amer: >60 mL/min (ref >60)
GFR calc non Af Amer: >60 mL/min (ref >60)
GLUCOSE: 204 mg/dL — AB (ref 65–99)
POTASSIUM: 4.1 mmol/L (ref 3.5–5.1)
Sodium: 138 mmol/L (ref 135–145)

## 2017-05-29 LAB — C DIFFICILE QUICK SCREEN W PCR REFLEX
C DIFFICILE (CDIFF) INTERP: NOT DETECTED
C Diff antigen: NEGATIVE
C Diff toxin: NEGATIVE

## 2017-05-29 LAB — MRSA PCR SCREENING: MRSA BY PCR: NEGATIVE

## 2017-05-29 LAB — GLUCOSE, CAPILLARY
GLUCOSE-CAPILLARY: 277 mg/dL — AB (ref 65–99)
GLUCOSE-CAPILLARY: 277 mg/dL — AB (ref 65–99)
GLUCOSE-CAPILLARY: 193 mg/dL — AB (ref 65–99)
Glucose-Capillary: 166 mg/dL — ABNORMAL HIGH (ref 65–99)

## 2017-05-29 MED ORDER — LOPERAMIDE HCL 2 MG PO CAPS
2.0000 mg | ORAL_CAPSULE | Freq: Once | ORAL | Status: AC
  Administered 2017-05-29: 2 mg via ORAL
  Filled 2017-05-29: qty 1

## 2017-05-29 MED ORDER — PEGFILGRASTIM INJECTION 6 MG/0.6ML ~~LOC~~
PREFILLED_SYRINGE | SUBCUTANEOUS | Status: AC
  Filled 2017-05-29: qty 0.6

## 2017-05-29 NOTE — Progress Notes (Signed)
Pt currently on 14 LPM [place] and tolerating well at this time.  RT will hold BIPAP at this time, RT to monitor and assess as needed.

## 2017-05-29 NOTE — Progress Notes (Signed)
Patient Demographics:    [name], is a 72 y.o. female, DOB - [birth year], DEY:[id number]  Admit date - 05/27/2017   Admitting Physician [name], MD  Outpatient Primary MD for the patient is [name], MD  LOS - 2   Chief Complaint  Patient presents with  . Altered Mental Status        Subjective:    [name] today has no fevers, no emesis,  No chest pain, level of care discussed with husband at bedside, patient complains of diarrhea, stool for C. 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  Patient had been receiving her  chemotherapy infusion when she became lethargic and hypoxic and sent to emergency department for further evaluation.  Chest x-ray on admission was suggestive of exacerbation of CHF and she has been currently managed for that.    Acute on chronic hypercarbic/hypoxic respiratory failure: Likely contributed by CHF and COPD.  Off of BiPAP   CHF exacerbation: Chest x-ray on admission showed low lung volumes with vascular congestion and small to moderate bilateral pleural effusions consistent with stigmata of CHF. Was started on Lasix IV twice a day.  She does not lower extremity  edema but has some edema in the upper extremities.  Auscultation today did not reveal any crackles. Lasix tapered to IV 40 mg once a day.  She is on Lasix 20 mg at home. Echocardiogram has been done.  We will follow the final report.  COPD: Continue inhalers and bronchodilators.  Not wheezing during my evaluation today.  Start on steroids IV which has been tapered to once a day.  Continue supplemental oxygen as needed.  Stage IV non-small cell lung cancer: Follows with Dr. Julien Nordmann.  Was on chemotherapy.  Status post palliative radiotherapy.  Has history of SVC syndrome.  Became hypoxic during her infusion with Taxol and carboplatin.  She will follow-up with oncology as an outpatient.  Paroxysmal A. fib: Currently rate is controlled.  On Eliquis and Cardizem.  Type 2 diabetes mellitus: Insulin at home.  We will continue with here.  Hemoglobin A1c in November 2018 was 10.6. We will continue to monitor her blood glucose.  Hypertension: Currently blood pressure stable.  Continue losartan.  Depression: Mood is stable.  Continue Celexa  Diarrhea-C. difficile negative, may use Imodium as needed    DVT prophylaxis:Eliquis Code Status: Full Family Communication:  Plan of care discussed with husband  at the bedside Disposition Plan: Home    Lab Results  Component Value Date   PLT 261 05/27/2017    Inpatient Medications  Scheduled Meds: .  apixaban  5 mg Oral BID  . arformoterol  15 mcg Nebulization BID  . atorvastatin  20 mg Oral q1800  . citalopram  20 mg Oral Daily  . diltiazem  360 mg Oral Daily  . furosemide  40 mg Intravenous Daily  . guaiFENesin  1,200 mg Oral BID  . insulin aspart  0-5 Units Subcutaneous QHS  . insulin aspart  0-9 Units Subcutaneous TID WC  . insulin glargine  10 Units Subcutaneous QHS  . losartan  50 mg Oral Daily  . methylPREDNISolone (SOLU-MEDROL) injection  40 mg Intravenous Q24H  . nystatin  5 mL Oral TID  . sodium chloride flush  3 mL  Intravenous Q12H  . umeclidinium bromide  1 puff Inhalation Daily   Continuous Infusions: . sodium chloride     PRN Meds:.sodium chloride, acetaminophen, ipratropium-albuterol, ondansetron (ZOFRAN) IV, polyethylene glycol, sodium chloride flush    Anti-infectives (From admission, onward)   None        Objective:   Vitals:   05/29/17 1500 05/29/17 1555 05/29/17 1600 05/29/17 1700  BP: (!) 104/36  (!) 111/47 (!) 113/55  Pulse: 95  92 91  Resp: (!) 31  19 16   Temp:  (!) 97.5 F (36.4 C)    TempSrc:  Oral    SpO2: 98%  100% 100%  Weight:      Height:        Wt Readings from Last 3 Encounters:  05/29/17 53.5 kg (117 lb 15.1 oz)  05/27/17 57.2 kg (126 lb 1.6 oz)  05/10/17 58.2 kg (128 lb 3.2 oz)     Intake/Output Summary (Last 24 hours) at 05/29/2017 1933 Last data filed at 05/29/2017 0830 Gross per 24 hour  Intake -  Output 650 ml  Net -650 ml     Physical Exam  Gen:- Awake Alert,  In no apparent distress  HEENT:- [place].AT, No sclera icterus Neck-Supple Neck,No JVD,.  Lungs-  Diminished in bases with bibasilar rales CV- S1, S2 normal, irregular Abd-  +ve B.Sounds, Abd Soft, No tenderness,    Extremity/Skin:-  Bil UE   edema, no leg edema Psych- affect is appropriate   Data Review:   Micro Results Recent Results (from the past 240 hour(s))  MRSA PCR Screening     Status: None   Collection Time: 05/29/17  8:40 AM  Result Value Ref Range Status   MRSA by PCR NEGATIVE NEGATIVE Final    Comment:        The GeneXpert MRSA Assay (FDA approved for NASAL specimens only), is one component of a comprehensive MRSA colonization surveillance program. It is not intended to diagnose MRSA infection nor to guide or monitor treatment for MRSA infections.   C difficile quick scan w PCR reflex     Status: None   Collection Time: 05/29/17 10:11 AM  Result Value Ref Range Status   C Diff antigen NEGATIVE NEGATIVE Final   C Diff toxin NEGATIVE NEGATIVE Final   C Diff  interpretation No C. difficile detected.  Final    Comment: NEGATIVE    Radiology Reports Dg Chest Port 1 View  Result Date: 05/27/2017 CLINICAL DATA:  Dyspnea and lethargy EXAM: PORTABLE CHEST 1 VIEW COMPARISON:  04/24/2017 FINDINGS: Obscuration of the diaphragms consistent with small to moderate bilateral pleural effusions. Low lung volumes with vascular congestion. Aortic atherosclerosis at the arch without definite aneurysm. The heart is partially obscured by the pleural effusions. No acute osseous abnormality. ACDF of the lower cervical spine. IMPRESSION: Low  lung volumes with vascular congestion and small to moderate bilateral pleural effusions consistent with stigmata of CHF. Electronically Signed   By: Ashley Royalty M.D.   On: 05/27/2017 22:35     CBC Recent Labs  Lab 05/27/17 1053  WBC 5.2  HGB 10.7*  HCT 33.0*  PLT 261  MCV 101.2*  MCH 32.8  MCHC 32.4  RDW 19.7*  LYMPHSABS 0.2*  MONOABS 0.6  EOSABS 0.2  BASOSABS 0.0    Chemistries  Recent Labs  Lab 05/27/17 1053 05/27/17 2201 05/28/17 0357 05/29/17 0317  NA 141 140 139 138  K 3.7 4.1 3.8 4.1  CL  --  99* 97* 93*  CO2 37* 35* 35* 38*  GLUCOSE 165* 158* 181* 204*  BUN 16.5 18 18  29*  CREATININE 0.5* <0.30* 0.40* 0.38*  CALCIUM 8.9 8.4* 8.2* 8.3*  AST 8  --   --   --   ALT 12  --   --   --   ALKPHOS 57  --   --   --   BILITOT 0.37  --   --   --    ------------------------------------------------------------------------------------------------------------------ No results for input(s): CHOL, HDL, LDLCALC, TRIG, CHOLHDL, LDLDIRECT in the last 72 hours.  Lab Results  Component Value Date   HGBA1C 10.6 (H) 04/11/2017   ------------------------------------------------------------------------------------------------------------------ No results for input(s): TSH, T4TOTAL, T3FREE, THYROIDAB in the last 72 hours.  Invalid input(s):  FREET3 ------------------------------------------------------------------------------------------------------------------ No results for input(s): VITAMINB12, FOLATE, FERRITIN, TIBC, IRON, RETICCTPCT in the last 72 hours.  Coagulation profile No results for input(s): INR, PROTIME in the last 168 hours.  No results for input(s): DDIMER in the last 72 hours.  Cardiac Enzymes No results for input(s): CKMB, TROPONINI, MYOGLOBIN in the last 168 hours.  Invalid input(s): CK ------------------------------------------------------------------------------------------------------------------    Component Value Date/Time   BNP 15.0 02/22/2017 2249     Johanna Matto M.D on 05/29/2017 at 7:33 PM  Between 7am to 7pm - Pager - [phone]  After 7pm go to [email or web address] - password [email or web address]  [facility] -  Office  [phone]   Voice Recognition Viviann Spare dictation system was used to create this note, attempts have been made to correct errors. Please contact the author with questions and/or clarifications.

## 2017-05-30 LAB — BASIC METABOLIC PANEL
ANION GAP: 6 (ref 5–15)
BUN: 27 mg/dL — AB (ref 6–20)
CO2: 39 mmol/L — AB (ref 22–32)
Calcium: 8.4 mg/dL — ABNORMAL LOW (ref 8.9–10.3)
Chloride: 93 mmol/L — ABNORMAL LOW (ref 101–111)
Creatinine, Ser: <0.3 mg/dL — ABNORMAL LOW (ref 0.44–1.00)
GLUCOSE: 157 mg/dL — AB (ref 65–99)
POTASSIUM: 4.2 mmol/L (ref 3.5–5.1)
Sodium: 138 mmol/L (ref 135–145)

## 2017-05-30 LAB — GLUCOSE, CAPILLARY
GLUCOSE-CAPILLARY: 248 mg/dL — AB (ref 65–99)
Glucose-Capillary: 128 mg/dL — ABNORMAL HIGH (ref 65–99)
Glucose-Capillary: 284 mg/dL — ABNORMAL HIGH (ref 65–99)

## 2017-05-30 MED ORDER — ORAL CARE MOUTH RINSE
15.0000 mL | Freq: Two times a day (BID) | OROMUCOSAL | Status: DC
  Administered 2017-05-30 – 2017-06-06 (×10): 15 mL via OROMUCOSAL

## 2017-05-30 MED ORDER — PREDNISONE 20 MG PO TABS
40.0000 mg | ORAL_TABLET | Freq: Every day | ORAL | Status: DC
  Administered 2017-05-31: 40 mg via ORAL
  Filled 2017-05-30: qty 2

## 2017-05-30 MED ORDER — FUROSEMIDE 10 MG/ML IJ SOLN
20.0000 mg | Freq: Once | INTRAMUSCULAR | Status: AC
  Administered 2017-05-30: 20 mg via INTRAVENOUS
  Filled 2017-05-30: qty 2

## 2017-05-30 MED ORDER — LOPERAMIDE HCL 2 MG PO CAPS
2.0000 mg | ORAL_CAPSULE | Freq: Once | ORAL | Status: AC
  Administered 2017-05-30: 2 mg via ORAL
  Filled 2017-05-30: qty 1

## 2017-05-30 NOTE — Progress Notes (Signed)
Inpatient Diabetes Program Recommendations  AACE/ADA: New Consensus Statement on Inpatient Glycemic Control (2015)  Target Ranges:  Prepandial:   less than 140 mg/dL      Peak postprandial:   less than 180 mg/dL (1-2 hours)      Critically ill patients:  140 - 180 mg/dL   Results for LESHA, JAGER (MRN [id number]) as of 05/30/2017 09:29  Ref. Range 05/29/2017 08:05 05/29/2017 12:32 05/29/2017 17:15 05/29/2017 21:21  Glucose-Capillary Latest Ref Range: 65 - 99 mg/dL 166 (H) 277 (H) 277 (H) 193 (H)   Results for CAMESHIA, CRESSMAN (MRN [id number]) as of 05/30/2017 09:29  Ref. Range 05/30/2017 08:07  Glucose-Capillary Latest Ref Range: 65 - 99 mg/dL 128 (H)    Home DM Meds: Lantus 10 units QHS       Glipizide 5 mg daily  Current Insulin Orders: Lantus 10 units QHS      Novolog Sensitive Correction Scale/ SSI (0-9 units) TID AC + HS         MD- Note patient getting Solumedrol 40 mg daily.  Fasting glucose improved this AM, however, patient had issues with afternoon CBGs yesterday.  Please consider starting Novolog Meal Coverage while patient getting steroids:  Novolog 3 units TID with meals (hold if pt eats <50% of meal)      --Will follow patient during hospitalization--  Wyn Quaker RN, MSN, CDE Diabetes Coordinator Inpatient Glycemic Control Team Team Pager: [phone] (8a-5p)

## 2017-05-30 NOTE — Progress Notes (Signed)
Patient Demographics:    [name], is a 72 y.o. female, DOB - 05/07/1946, LEX:[id number]  Admit date - 05/27/2017   Admitting Physician [name], MD  Outpatient Primary MD for the patient is [name], MD  LOS - 3   Chief Complaint  Patient presents with  . 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  Patient had been receiving her  chemotherapy infusion when she became lethargic and hypoxic and sent to emergency department for further evaluation.  Chest x-ray on admission was suggestive of exacerbation of CHF and she has been currently managed for that.    1)Acute on chronic hypercarbic/hypoxic respiratory failure: Likely contributed by CHF and COPD-continue supplemental oxygen, PTA patient was on 2.5 L of oxygen at home, she is now requiring 12 L of oxygen.  Repeat chest x-ray  2)HFpEF- patient with history of CHF with preserved ejection fraction, last known EF is 55-60%, continue IV Lasix 40 mg daily, prior to admission patient was on Lasix 20 mg p.o. Daily.  Daily weight  and fluid input and output monitoring, repeat chest x-ray pending.  Patient does not have significant lower extremity edema, however she does have bilateral upper extremity edema  3)Acute COPD exacerbation:-Changed to prednisone 40 mg daily, continue bronchodilators, continue supplemental oxygen continue inhalers and bronchodilators.  Not wheezing during my evaluation today.  Start on steroids IV which has been tapered to once a day.  Continue supplemental oxygen as needed.  4)Stage IV non-small cell lung cancer: Follows with Dr. Julien Nordmann.  Was on chemotherapy.  Status post palliative radiotherapy.  Has history of SVC syndrome.  Became hypoxic during her infusion with Taxol and carboplatin.  She will follow-up with oncology as an outpatient.  5)Paroxysmal A. fib: Continue stroke prophylaxis/anticoagulation with Eliquis and Cardizem for rate control  6)Type 2 diabetes mellitus: Previously uncontrolled, Hemoglobin A1c in November 2018 was 10.6, worsening her hyperglycemia due to steroids, should improve with tapering steroids, continue current insulin regimen diabetic educator recommendations noted.     7)Hypertension:  Stable, c/n Losartan.  8)Depression: Mood is stable.  Continue Celexa  9)Diarrhea-C. difficile negative, may use Imodium as needed    DVT prophylaxis:Eliquis Code Status: Full Family Communication:  Plan of care discussed with husband  at the bedside Disposition Plan: Home when oxygen requirement improved    Lab Results  Component Value Date   PLT 261 05/27/2017    Inpatient  Medications  Scheduled Meds: . apixaban  5 mg Oral BID  . arformoterol  15 mcg Nebulization BID  . atorvastatin  20 mg Oral q1800  . citalopram  20 mg Oral Daily  . diltiazem  360 mg Oral Daily  . furosemide  40 mg Intravenous Daily  . guaiFENesin  1,200 mg Oral BID  . insulin aspart  0-5 Units Subcutaneous QHS  . insulin aspart  0-9 Units Subcutaneous TID WC  . insulin glargine   10 Units Subcutaneous QHS  . losartan  50 mg Oral Daily  . mouth rinse  15 mL Mouth Rinse BID  . nystatin  5 mL Oral TID  . [START ON 05/31/2017] predniSONE  40 mg Oral Q breakfast  . sodium chloride flush  3 mL Intravenous Q12H  . umeclidinium bromide  1 puff Inhalation Daily   Continuous Infusions: . sodium chloride     PRN Meds:.sodium chloride, acetaminophen, ipratropium-albuterol, ondansetron (ZOFRAN) IV, sodium chloride flush    Anti-infectives (From admission, onward)   None        Objective:   Vitals:   05/30/17 1004 05/30/17 1200 05/30/17 1400 05/30/17 1600  BP: (!) 123/33 (!) 130/19 (!) 143/18 (!) 112/39  Pulse:  89 81 81  Resp:  (!) 36 (!) 5 20  Temp:  97.7 F (36.5 C)  97.8 F (36.6 C)  TempSrc:  Oral  Oral  SpO2:  98% 98% 97%  Weight:      Height:        Wt Readings from Last 3 Encounters:  05/30/17 51 kg (112 lb 7 oz)  05/27/17 57.2 kg (126 lb 1.6 oz)  05/10/17 58.2 kg (128 lb 3.2 oz)     Intake/Output Summary (Last 24 hours) at 05/30/2017 1617 Last data filed at 05/30/2017 1515 Gross per 24 hour  Intake -  Output 600 ml  Net -600 ml     Physical Exam  Gen:- Awake Alert,  In no apparent distress  HEENT:- [place].AT, No sclera icterus Neck-Supple Neck,No JVD,.  Lungs-  Diminished in bases with bibasilar rales CV- S1, S2 normal, irregular Abd-  +ve B.Sounds, Abd Soft, No tenderness,    Extremity/Skin:-  Bil UE   edema, no leg edema Psych- affect is appropriate Neuro-generalized weakness without new focal deficits  Data Review:   Micro Results Recent Results (from the past 240 hour(s))  MRSA PCR Screening     Status: None   Collection Time: 05/29/17  8:40 AM  Result Value Ref Range Status   MRSA by PCR NEGATIVE NEGATIVE Final    Comment:        The GeneXpert MRSA Assay (FDA approved for NASAL specimens only), is one component of a comprehensive MRSA colonization surveillance program. It is not intended to diagnose MRSA infection nor to  guide or monitor treatment for MRSA infections.   C difficile quick scan w PCR reflex     Status: None   Collection Time: 05/29/17 10:11 AM  Result Value Ref Range Status   C Diff antigen NEGATIVE NEGATIVE Final   C Diff toxin NEGATIVE NEGATIVE Final   C Diff interpretation No C. difficile detected.  Final    Comment: NEGATIVE    Radiology Reports Dg Chest Port 1 View  Result Date: 05/27/2017 CLINICAL DATA:  Dyspnea and lethargy EXAM: PORTABLE CHEST 1 VIEW COMPARISON:  04/24/2017 FINDINGS: Obscuration of the diaphragms consistent with small to moderate bilateral pleural effusions. Low lung volumes with vascular congestion. Aortic atherosclerosis at the arch  without definite aneurysm. The heart is partially obscured by the pleural effusions. No acute osseous abnormality. ACDF of the lower cervical spine. IMPRESSION: Low lung volumes with vascular congestion and small to moderate bilateral pleural effusions consistent with stigmata of CHF. Electronically Signed   By: Ashley Royalty M.D.   On: 05/27/2017 22:35     CBC Recent Labs  Lab 05/27/17 1053  WBC 5.2  HGB 10.7*  HCT 33.0*  PLT 261  MCV 101.2*  MCH 32.8  MCHC 32.4  RDW 19.7*  LYMPHSABS 0.2*  MONOABS 0.6  EOSABS 0.2  BASOSABS 0.0    Chemistries  Recent Labs  Lab 05/27/17 1053 05/27/17 2201 05/28/17 0357 05/29/17 0317 05/30/17 0255  NA 141 140 139 138 138  K 3.7 4.1 3.8 4.1 4.2  CL  --  99* 97* 93* 93*  CO2 37* 35* 35* 38* 39*  GLUCOSE 165* 158* 181* 204* 157*  BUN 16.5 18 18  29* 27*  CREATININE 0.5* <0.30* 0.40* 0.38* <0.30*  CALCIUM 8.9 8.4* 8.2* 8.3* 8.4*  AST 8  --   --   --   --   ALT 12  --   --   --   --   ALKPHOS 57  --   --   --   --   BILITOT 0.37  --   --   --   --    ------------------------------------------------------------------------------------------------------------------ No results for input(s): CHOL, HDL, LDLCALC, TRIG, CHOLHDL, LDLDIRECT in the last 72 hours.  Lab Results  Component  Value Date   HGBA1C 10.6 (H) 04/11/2017   ------------------------------------------------------------------------------------------------------------------ No results for input(s): TSH, T4TOTAL, T3FREE, THYROIDAB in the last 72 hours.  Invalid input(s): FREET3 ------------------------------------------------------------------------------------------------------------------ No results for input(s): VITAMINB12, FOLATE, FERRITIN, TIBC, IRON, RETICCTPCT in the last 72 hours.  Coagulation profile No results for input(s): INR, PROTIME in the last 168 hours.  No results for input(s): DDIMER in the last 72 hours.  Cardiac Enzymes No results for input(s): CKMB, TROPONINI, MYOGLOBIN in the last 168 hours.  Invalid input(s): CK ------------------------------------------------------------------------------------------------------------------    Component Value Date/Time   BNP 15.0 02/22/2017 2249     Deshea Pooley M.D on 05/30/2017 at 4:17 PM  Between 7am to 7pm - Pager - [phone]  After 7pm go to [email or web address] - password [email or web address]  [facility] -  Office  [phone]   Voice Recognition Viviann Spare dictation system was used to create this note, attempts have been made to correct errors. Please contact the author with questions and/or clarifications.

## 2017-05-31 ENCOUNTER — Inpatient Hospital Stay (HOSPITAL_COMMUNITY): Payer: Medicare Other

## 2017-05-31 LAB — BASIC METABOLIC PANEL
Anion gap: 6 (ref 5–15)
BUN: 30 mg/dL — ABNORMAL HIGH (ref 6–20)
CALCIUM: 8.6 mg/dL — AB (ref 8.9–10.3)
CO2: 39 mmol/L — ABNORMAL HIGH (ref 22–32)
Chloride: 91 mmol/L — ABNORMAL LOW (ref 101–111)
Creatinine, Ser: 0.37 mg/dL — ABNORMAL LOW (ref 0.44–1.00)
GFR calc non Af Amer: >60 mL/min (ref >60)
Glucose, Bld: 233 mg/dL — ABNORMAL HIGH (ref 65–99)
Potassium: 3.9 mmol/L (ref 3.5–5.1)
SODIUM: 136 mmol/L (ref 135–145)

## 2017-05-31 LAB — CBC
HCT: 34.1 % — ABNORMAL LOW (ref 36.0–46.0)
Hemoglobin: 10.8 g/dL — ABNORMAL LOW (ref 12.0–15.0)
MCH: 32.5 pg (ref 26.0–34.0)
MCHC: 31.7 g/dL (ref 30.0–36.0)
MCV: 102.7 fL — ABNORMAL HIGH (ref 78.0–100.0)
Platelets: 238 10*3/uL (ref 150–400)
RBC: 3.32 MIL/uL — AB (ref 3.87–5.11)
RDW: 17.2 % — AB (ref 11.5–15.5)
WBC: 1 10*3/uL — AB (ref 4.0–10.5)

## 2017-05-31 LAB — GLUCOSE, CAPILLARY
GLUCOSE-CAPILLARY: 287 mg/dL — AB (ref 65–99)
GLUCOSE-CAPILLARY: 212 mg/dL — AB (ref 65–99)
Glucose-Capillary: 193 mg/dL — ABNORMAL HIGH (ref 65–99)
Glucose-Capillary: 133 mg/dL — ABNORMAL HIGH (ref 65–99)
Glucose-Capillary: 231 mg/dL — ABNORMAL HIGH (ref 65–99)

## 2017-05-31 MED ORDER — FUROSEMIDE 10 MG/ML IJ SOLN
20.0000 mg | Freq: Once | INTRAMUSCULAR | Status: AC
  Administered 2017-05-31: 20 mg via INTRAVENOUS
  Filled 2017-05-31: qty 2

## 2017-05-31 MED ORDER — INSULIN GLARGINE 100 UNIT/ML ~~LOC~~ SOLN
12.0000 [IU] | Freq: Every day | SUBCUTANEOUS | Status: DC
  Administered 2017-05-31 – 2017-06-06 (×7): 12 [IU] via SUBCUTANEOUS
  Filled 2017-05-31 (×9): qty 0.12

## 2017-05-31 MED ORDER — LOPERAMIDE HCL 2 MG PO CAPS
4.0000 mg | ORAL_CAPSULE | Freq: Once | ORAL | Status: AC
  Administered 2017-05-31: 4 mg via ORAL
  Filled 2017-05-31: qty 2

## 2017-05-31 MED ORDER — PREDNISONE 20 MG PO TABS
20.0000 mg | ORAL_TABLET | Freq: Every day | ORAL | Status: DC
  Administered 2017-06-01 – 2017-06-07 (×7): 20 mg via ORAL
  Filled 2017-05-31 (×7): qty 1

## 2017-05-31 NOTE — Progress Notes (Signed)
Chaplain following due to spiritual care consult re: advance directives.  Spoke with Mariann Laster, who wishes to speak with chaplain about advance directive when husband is present.   Made appointment to follow up with pt and husband on Monday at [place] / [place], [facility]

## 2017-05-31 NOTE — Progress Notes (Signed)
Inpatient Diabetes Program Recommendations  AACE/ADA: New Consensus Statement on Inpatient Glycemic Control (2015)  Target Ranges:  Prepandial:   less than 140 mg/dL      Peak postprandial:   less than 180 mg/dL (1-2 hours)      Critically ill patients:  140 - 180 mg/dL   Lab Results  Component Value Date   GLUCAP 193 (H) 05/31/2017   HGBA1C 10.6 (H) 04/11/2017    Review of Glycemic Control  Post-prandial blood sugars elevated. Needs meal coverage insulin.  Inpatient Diabetes Program Recommendations:    Add Novolog 3 units tidwc for meal coverage insulin if pt eats > 50% meal.  If FBS > 180 mg/dL, increase Lantus to 12 units QHS.  Continue to follow.  Thank you. Lorenda Peck, RD, LDN, CDE Inpatient Diabetes Coordinator [phone]

## 2017-05-31 NOTE — Progress Notes (Signed)
Patient Demographics:    [name], is a 72 y.o. female, DOB - August 13, 1945, HER:[id number]  Admit date - 05/27/2017   Admitting Physician [name], MD  Outpatient Primary MD for the patient is [name], MD  LOS - 4   Chief Complaint  Patient presents with  . 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  Patient had been receiving her  chemotherapy infusion when she became lethargic and hypoxic and sent to emergency department for further evaluation.  Chest x-ray on admission was suggestive of exacerbation of CHF and she has been currently managed for that.   Plan:- 1)Acute on chronic hypercarbic/hypoxic respiratory failure: Likely contributed by CHF and COPD-improving, patient is down to 6-8 L of oxygen via nasal cannula from 12 L the day before , PTA patient was on 2.5 L of oxygen at home,   2)HFpEF-overall improving with diuresis, Oxygen requirement is better as noted in #1 above,  patient with history of CHF with preserved  ejection fraction, last known EF is 55-60%, continue IV Lasix 40 mg daily, prior to admission patient was on Lasix 20 mg p.o. Daily.  Daily weight and fluid input and output monitoring, repeat chest x-ray pending.  Patient does not have significant lower extremity edema, however she does have bilateral upper extremity edema  3)Acute COPD exacerbation:- mproving above #1, decrease prednisone to 20 mg daily, continue bronchodilators, continue supplemental oxygen continue inhalers and bronchodilators.     4)Stage IV non-small cell lung cancer: Follows with Dr. Julien Nordmann.  Was on chemotherapy.  Status post palliative radiotherapy.  Has history of SVC syndrome.  Became hypoxic during her infusion with Taxol and carboplatin on 05/27/17.  She will follow-up with oncology as an outpatient.  5)Paroxysmal A. fib: Continue stroke prophylaxis/anticoagulation with Eliquis and Cardizem for rate control  6)Type 2 diabetes mellitus: Previously uncontrolled, Hemoglobin A1c in November 2018 was 10.6, worsening her hyperglycemia due to steroids, should improve with tapering steroids, increase Lantus insulin to 12 units nightly , regimen diabetic educator recommendations noted.  Hold off on scheduled mealtime insulin at this time as patient's oral intake is erratic and she is having loose stools with risk for hypoglycemia  7)Hypertension:  Stable, c/n Losartan.  8)Depression: Mood is stable.  Continue Celexa  9)Diarrhea-C. difficile negative, may use Imodium as needed  10)Leukopenia-suspect chemo-induced leukopenia, WBC is 1.0, last  chemoRx 05/27/17, neutropenic precautions, no fevers   DVT prophylaxis:Eliquis Code Status: Full Family Communication:  Plan of care discussed with husband  at the bedside Disposition Plan: Home when oxygen requirement improved   Lab Results  Component Value Date   PLT 238 05/31/2017    Inpatient Medications  Scheduled Meds: . apixaban  5 mg Oral BID  .  arformoterol  15 mcg Nebulization BID  . atorvastatin  20 mg Oral q1800  . citalopram  20 mg Oral Daily  . diltiazem  360 mg Oral Daily  . furosemide  40 mg Intravenous Daily  . guaiFENesin  1,200 mg Oral BID  . insulin aspart  0-5 Units Subcutaneous QHS  . insulin aspart  0-9 Units Subcutaneous TID WC  . insulin glargine  12 Units Subcutaneous QHS  . losartan  50 mg Oral Daily  . mouth rinse  15 mL Mouth Rinse BID  . nystatin  5 mL Oral TID  . [START ON 06/01/2017] predniSONE  20 mg Oral Q breakfast  . sodium chloride flush  3 mL Intravenous Q12H  . umeclidinium bromide  1 puff Inhalation Daily   Continuous Infusions: . sodium chloride     PRN Meds:.sodium chloride, acetaminophen, ipratropium-albuterol, ondansetron (ZOFRAN) IV, sodium chloride flush    Anti-infectives (From admission, onward)   None        Objective:   Vitals:   05/31/17 0700 05/31/17 1003 05/31/17 1200 05/31/17 1620  BP:      Pulse: 96     Resp: 15     Temp:   97.6 F (36.4 C) (!) 97.5 F (36.4 C)  TempSrc:   Oral Oral  SpO2: 99% 96%    Weight:      Height:        Wt Readings from Last 3 Encounters:  05/30/17 51 kg (112 lb 7 oz)  05/27/17 57.2 kg (126 lb 1.6 oz)  05/10/17 58.2 kg (128 lb 3.2 oz)     Intake/Output Summary (Last 24 hours) at 05/31/2017 1717 Last data filed at 05/31/2017 1620 Gross per 24 hour  Intake 243 ml  Output 750 ml  Net -507 ml     Physical Exam  Gen:- Awake Alert,  In no apparent distress  HEENT:- [place].AT, No sclera icterus Neck-Supple Neck,No JVD,.  Lungs-  Diminished in bases with bibasilar rales CV- S1, S2 normal, irregular Abd-  +ve B.Sounds, Abd Soft, No tenderness,    Extremity/Skin:-  Bil UE   edema, no leg edema Psych- affect is appropriate Neuro-generalized weakness without new focal deficits  Data Review:   Micro Results Recent Results (from the past 240 hour(s))  MRSA PCR Screening     Status: None   Collection Time: 05/29/17  8:40 AM  Result  Value Ref Range Status   MRSA by PCR NEGATIVE NEGATIVE Final    Comment:        The GeneXpert MRSA Assay (FDA approved for NASAL specimens only), is one component of a comprehensive MRSA colonization surveillance program. It is not intended to diagnose MRSA infection nor to guide or monitor treatment for MRSA infections.   C difficile quick scan w PCR reflex     Status: None   Collection Time: 05/29/17 10:11 AM  Result Value Ref Range Status   C Diff antigen NEGATIVE NEGATIVE Final   C Diff toxin NEGATIVE NEGATIVE Final   C Diff interpretation No C. difficile detected.  Final    Comment: NEGATIVE    Radiology Reports Dg  Chest Port 1 View  Result Date: 05/31/2017 CLINICAL DATA:  Shortness of breath. EXAM: PORTABLE CHEST 1 VIEW COMPARISON:  Radiograph of May 27, 2017. FINDINGS: Stable cardiomediastinal silhouette no pneumothorax is noted. Stable bilateral pleural effusions are noted with probable associated atelectasis. Atherosclerosis of thoracic aorta is noted. Bony thorax is unremarkable. IMPRESSION: Aortic atherosclerosis. Stable bilateral pleural effusions with associated atelectasis. Electronically Signed   By: Marijo Conception, M.D.   On: 05/31/2017 07:11   Dg Chest Port 1 View  Result Date: 05/27/2017 CLINICAL DATA:  Dyspnea and lethargy EXAM: PORTABLE CHEST 1 VIEW COMPARISON:  04/24/2017 FINDINGS: Obscuration of the diaphragms consistent with small to moderate bilateral pleural effusions. Low lung volumes with vascular congestion. Aortic atherosclerosis at the arch without definite aneurysm. The heart is partially obscured by the pleural effusions. No acute osseous abnormality. ACDF of the lower cervical spine. IMPRESSION: Low lung volumes with vascular congestion and small to moderate bilateral pleural effusions consistent with stigmata of CHF. Electronically Signed   By: Ashley Royalty M.D.   On: 05/27/2017 22:35     CBC Recent Labs  Lab 05/27/17 1053 05/31/17 0302  WBC  5.2 1.0*  HGB 10.7* 10.8*  HCT 33.0* 34.1*  PLT 261 238  MCV 101.2* 102.7*  MCH 32.8 32.5  MCHC 32.4 31.7  RDW 19.7* 17.2*  LYMPHSABS 0.2*  --   MONOABS 0.6  --   EOSABS 0.2  --   BASOSABS 0.0  --     Chemistries  Recent Labs  Lab 05/27/17 1053 05/27/17 2201 05/28/17 0357 05/29/17 0317 05/30/17 0255 05/31/17 0302  NA 141 140 139 138 138 136  K 3.7 4.1 3.8 4.1 4.2 3.9  CL  --  99* 97* 93* 93* 91*  CO2 37* 35* 35* 38* 39* 39*  GLUCOSE 165* 158* 181* 204* 157* 233*  BUN 16.5 18 18  29* 27* 30*  CREATININE 0.5* <0.30* 0.40* 0.38* <0.30* 0.37*  CALCIUM 8.9 8.4* 8.2* 8.3* 8.4* 8.6*  AST 8  --   --   --   --   --   ALT 12  --   --   --   --   --   ALKPHOS 57  --   --   --   --   --   BILITOT 0.37  --   --   --   --   --    ------------------------------------------------------------------------------------------------------------------ No results for input(s): CHOL, HDL, LDLCALC, TRIG, CHOLHDL, LDLDIRECT in the last 72 hours.  Lab Results  Component Value Date   HGBA1C 10.6 (H) 04/11/2017   ------------------------------------------------------------------------------------------------------------------ No results for input(s): TSH, T4TOTAL, T3FREE, THYROIDAB in the last 72 hours.  Invalid input(s): FREET3 ------------------------------------------------------------------------------------------------------------------ No results for input(s): VITAMINB12, FOLATE, FERRITIN, TIBC, IRON, RETICCTPCT in the last 72 hours.  Coagulation profile No results for input(s): INR, PROTIME in the last 168 hours.  No results for input(s): DDIMER in the last 72 hours.  Cardiac Enzymes No results for input(s): CKMB, TROPONINI, MYOGLOBIN in the last 168 hours.  Invalid input(s): CK ------------------------------------------------------------------------------------------------------------------    Component Value Date/Time   BNP 15.0 02/22/2017 2249     [name] M.D on  05/31/2017 at 5:17 PM  Between 7am to 7pm - Pager - [phone]  After 7pm go to [email or web address] - password [email or web address]  [facility] -  Office  [phone]   Voice Recognition Viviann Spare dictation system was used to create this note, attempts have been made to correct errors. Please contact the author with  questions and/or clarifications.

## 2017-06-01 LAB — CBC WITH DIFFERENTIAL/PLATELET
Basophils Absolute: 0 10*3/uL (ref 0.0–0.1)
Basophils Relative: 0 %
EOS ABS: 0.1 10*3/uL (ref 0.0–0.7)
Eosinophils Relative: 4 %
HCT: 32.5 % — ABNORMAL LOW (ref 36.0–46.0)
HEMOGLOBIN: 10.3 g/dL — AB (ref 12.0–15.0)
LYMPHS ABS: 0.1 10*3/uL — AB (ref 0.7–4.0)
LYMPHS PCT: 4 %
MCH: 32.3 pg (ref 26.0–34.0)
MCHC: 31.7 g/dL (ref 30.0–36.0)
MCV: 101.9 fL — ABNORMAL HIGH (ref 78.0–100.0)
Monocytes Absolute: 0.1 10*3/uL (ref 0.1–1.0)
Monocytes Relative: 7 %
NEUTROS PCT: 85 %
Neutro Abs: 1.8 10*3/uL (ref 1.7–7.7)
Platelets: 238 10*3/uL (ref 150–400)
RBC: 3.19 MIL/uL — AB (ref 3.87–5.11)
RDW: 16.7 % — ABNORMAL HIGH (ref 11.5–15.5)
WBC: 2.1 10*3/uL — AB (ref 4.0–10.5)

## 2017-06-01 LAB — GLUCOSE, CAPILLARY
GLUCOSE-CAPILLARY: 227 mg/dL — AB (ref 65–99)
Glucose-Capillary: 106 mg/dL — ABNORMAL HIGH (ref 65–99)
Glucose-Capillary: 165 mg/dL — ABNORMAL HIGH (ref 65–99)
Glucose-Capillary: 112 mg/dL — ABNORMAL HIGH (ref 65–99)

## 2017-06-01 LAB — BASIC METABOLIC PANEL
Anion gap: 6 (ref 5–15)
BUN: 29 mg/dL — ABNORMAL HIGH (ref 6–20)
CO2: 40 mmol/L — ABNORMAL HIGH (ref 22–32)
Calcium: 8.3 mg/dL — ABNORMAL LOW (ref 8.9–10.3)
Chloride: 89 mmol/L — ABNORMAL LOW (ref 101–111)
Creatinine, Ser: 0.45 mg/dL (ref 0.44–1.00)
GFR calc non Af Amer: >60 mL/min (ref >60)
Glucose, Bld: 115 mg/dL — ABNORMAL HIGH (ref 65–99)
POTASSIUM: 4.1 mmol/L (ref 3.5–5.1)
SODIUM: 135 mmol/L (ref 135–145)

## 2017-06-01 MED ORDER — LOPERAMIDE HCL 2 MG PO CAPS
2.0000 mg | ORAL_CAPSULE | ORAL | Status: DC | PRN
  Administered 2017-06-01: 2 mg via ORAL
  Filled 2017-06-01: qty 1

## 2017-06-01 NOTE — Progress Notes (Signed)
Patient Demographics:    [name], is a 72 y.o. female, DOB - 09/24/1945, BJY:[id number]  Admit date - 05/27/2017   Admitting Physician [name], MD  Outpatient Primary MD for the patient is [name], MD  LOS - 5   Chief Complaint  Patient presents with  . 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  Patient had been receiving her  chemotherapy infusion when she became lethargic and hypoxic and sent to emergency department for further evaluation.  Chest x-ray on admission was suggestive of exacerbation of CHF and she has been currently managed for that.   Plan:- 1)Acute on chronic hypercarbic/hypoxic respiratory failure:  Hypoxia continues to improve, patient is now on 6 L by nasal cannula , likely contributed by CHF and COPD-was 12 L couple of days ago, PTA patient was on 2.5 L of oxygen at home,   2)HFpEF-overall improving with diuresis, Oxygen requirement is better as noted in #1 above,  patient with history of CHF with preserved ejection fraction,  last known EF is 55-60%, continue IV Lasix 40 mg daily, prior to admission patient was on Lasix 20 mg p.o. Daily.  Daily weight and fluid input and output monitoring, repeat chest x-ray pending.  Patient does not have significant lower extremity edema, however she does have bilateral upper extremity edema  3)Acute COPD exacerbation:- improving above #1, c/n prednisone to 20 mg daily, continue bronchodilators, continue supplemental oxygen continue inhalers and bronchodilators.     4)Stage IV non-small cell lung cancer: Follows with Dr. Julien Nordmann.  Was on chemotherapy.  Status post palliative radiotherapy.  Has history of SVC syndrome.  Became hypoxic during her infusion with Taxol and carboplatin on 05/27/17.  She will follow-up with oncology as an outpatient.  5)Paroxysmal A. fib: Continue stroke prophylaxis/anticoagulation with Eliquis and Cardizem for rate control  6)Type 2 diabetes mellitus: Previously uncontrolled, Hemoglobin A1c in November 2018 was 10.6, worsening her hyperglycemia due to steroids, should improve with tapering steroids, increase Lantus insulin to 12 units nightly , regimen diabetic educator recommendations noted.  Hold off on scheduled mealtime insulin at this time as patient's oral intake is erratic and she is having loose stools with risk for hypoglycemia  7)Hypertension:  Stable, c/n Losartan.  8)Depression: Mood is stable.  Continue Celexa  9)Diarrhea-C. difficile negative, may use Imodium as needed  10)Leukopenia-suspect chemo-induced leukopenia, WBC  is 2.1 with ANC of 1.8,  last chemoRx 05/27/17, STOP neutropenic precautions, no fevers   DVT prophylaxis:Eliquis Code Status: Full Family Communication:  Plan of care discussed with husband  at the bedside Disposition Plan: Home when oxygen requirement improved   Lab Results  Component Value Date   PLT 238 06/01/2017    Inpatient Medications  Scheduled Meds: . apixaban  5 mg Oral BID  . arformoterol   15 mcg Nebulization BID  . atorvastatin  20 mg Oral q1800  . citalopram  20 mg Oral Daily  . diltiazem  360 mg Oral Daily  . furosemide  40 mg Intravenous Daily  . guaiFENesin  1,200 mg Oral BID  . insulin aspart  0-5 Units Subcutaneous QHS  . insulin aspart  0-9 Units Subcutaneous TID WC  . insulin glargine  12 Units Subcutaneous QHS  . losartan  50 mg Oral Daily  . mouth rinse  15 mL Mouth Rinse BID  . nystatin  5 mL Oral TID  . predniSONE  20 mg Oral Q breakfast  . sodium chloride flush  3 mL Intravenous Q12H  . umeclidinium bromide  1 puff Inhalation Daily   Continuous Infusions: . sodium chloride 250 mL (06/01/17 0755)   PRN Meds:.sodium chloride, acetaminophen, ipratropium-albuterol, loperamide, ondansetron (ZOFRAN) IV, sodium chloride flush    Anti-infectives (From admission, onward)   None        Objective:   Vitals:   06/01/17 1031 06/01/17 1200 06/01/17 1600 06/01/17 1800  BP:    (!) 140/35  Pulse:    87  Resp:    (!) 37  Temp:  97.7 F (36.5 C) 97.7 F (36.5 C)   TempSrc:  Oral Oral   SpO2: 93%   90%  Weight:      Height:        Wt Readings from Last 3 Encounters:  05/31/17 51 kg (112 lb 7 oz)  05/27/17 57.2 kg (126 lb 1.6 oz)  05/10/17 58.2 kg (128 lb 3.2 oz)     Intake/Output Summary (Last 24 hours) at 06/01/2017 1922 Last data filed at 06/01/2017 1800 Gross per 24 hour  Intake 580.83 ml  Output 600 ml  Net -19.17 ml     Physical Exam  Gen:- Awake Alert,  In no apparent distress  HEENT:- [place].AT, No sclera icterus , [place] 6 L /min Neck-Supple Neck,No JVD,.  Lungs-  Diminished in bases with bibasilar rales CV- S1, S2 normal, irregular Abd-  +ve B.Sounds, Abd Soft, No tenderness,    Extremity/Skin:-  Bil UE   edema, no leg edema Psych- affect is appropriate Neuro-generalized weakness without new focal deficits  Data Review:   Micro Results Recent Results (from the past 240 hour(s))  MRSA PCR Screening     Status: None   Collection Time:  05/29/17  8:40 AM  Result Value Ref Range Status   MRSA by PCR NEGATIVE NEGATIVE Final    Comment:        The GeneXpert MRSA Assay (FDA approved for NASAL specimens only), is one component of a comprehensive MRSA colonization surveillance program. It is not intended to diagnose MRSA infection nor to guide or monitor treatment for MRSA infections.   C difficile quick scan w PCR reflex     Status: None   Collection Time: 05/29/17 10:11 AM  Result Value Ref Range Status   C Diff antigen NEGATIVE NEGATIVE Final   C Diff toxin NEGATIVE NEGATIVE Final   C Diff interpretation No C.  difficile detected.  Final    Comment: NEGATIVE    Radiology Reports Dg Chest Port 1 View  Result Date: 05/31/2017 CLINICAL DATA:  Shortness of breath. EXAM: PORTABLE CHEST 1 VIEW COMPARISON:  Radiograph of May 27, 2017. FINDINGS: Stable cardiomediastinal silhouette no pneumothorax is noted. Stable bilateral pleural effusions are noted with probable associated atelectasis. Atherosclerosis of thoracic aorta is noted. Bony thorax is unremarkable. IMPRESSION: Aortic atherosclerosis. Stable bilateral pleural effusions with associated atelectasis. Electronically Signed   By: Marijo Conception, M.D.   On: 05/31/2017 07:11   Dg Chest Port 1 View  Result Date: 05/27/2017 CLINICAL DATA:  Dyspnea and lethargy EXAM: PORTABLE CHEST 1 VIEW COMPARISON:  04/24/2017 FINDINGS: Obscuration of the diaphragms consistent with small to moderate bilateral pleural effusions. Low lung volumes with vascular congestion. Aortic atherosclerosis at the arch without definite aneurysm. The heart is partially obscured by the pleural effusions. No acute osseous abnormality. ACDF of the lower cervical spine. IMPRESSION: Low lung volumes with vascular congestion and small to moderate bilateral pleural effusions consistent with stigmata of CHF. Electronically Signed   By: Ashley Royalty M.D.   On: 05/27/2017 22:35     CBC Recent Labs  Lab  05/27/17 1053 05/31/17 0302 06/01/17 0315  WBC 5.2 1.0* 2.1*  HGB 10.7* 10.8* 10.3*  HCT 33.0* 34.1* 32.5*  PLT 261 238 238  MCV 101.2* 102.7* 101.9*  MCH 32.8 32.5 32.3  MCHC 32.4 31.7 31.7  RDW 19.7* 17.2* 16.7*  LYMPHSABS 0.2*  --  0.1*  MONOABS 0.6  --  0.1  EOSABS 0.2  --  0.1  BASOSABS 0.0  --  0.0    Chemistries  Recent Labs  Lab 05/27/17 1053  05/28/17 0357 05/29/17 0317 05/30/17 0255 05/31/17 0302 06/01/17 0315  NA 141   < > 139 138 138 136 135  K 3.7   < > 3.8 4.1 4.2 3.9 4.1  CL  --    < > 97* 93* 93* 91* 89*  CO2 37*   < > 35* 38* 39* 39* 40*  GLUCOSE 165*   < > 181* 204* 157* 233* 115*  BUN 16.5   < > 18 29* 27* 30* 29*  CREATININE 0.5*   < > 0.40* 0.38* <0.30* 0.37* 0.45  CALCIUM 8.9   < > 8.2* 8.3* 8.4* 8.6* 8.3*  AST 8  --   --   --   --   --   --   ALT 12  --   --   --   --   --   --   ALKPHOS 57  --   --   --   --   --   --   BILITOT 0.37  --   --   --   --   --   --    < > = values in this interval not displayed.   ------------------------------------------------------------------------------------------------------------------ No results for input(s): CHOL, HDL, LDLCALC, TRIG, CHOLHDL, LDLDIRECT in the last 72 hours.  Lab Results  Component Value Date   HGBA1C 10.6 (H) 04/11/2017   ------------------------------------------------------------------------------------------------------------------ No results for input(s): TSH, T4TOTAL, T3FREE, THYROIDAB in the last 72 hours.  Invalid input(s): FREET3 ------------------------------------------------------------------------------------------------------------------ No results for input(s): VITAMINB12, FOLATE, FERRITIN, TIBC, IRON, RETICCTPCT in the last 72 hours.  Coagulation profile No results for input(s): INR, PROTIME in the last 168 hours.  No results for input(s): DDIMER in the last 72 hours.  Cardiac Enzymes No results for input(s): CKMB, TROPONINI, MYOGLOBIN in  the last 168  hours.  Invalid input(s): CK ------------------------------------------------------------------------------------------------------------------    Component Value Date/Time   BNP 15.0 02/22/2017 2249     Ted Leonhart M.D on 06/01/2017 at 7:22 PM  Between 7am to 7pm - Pager - [phone]  After 7pm go to [email or web address] - password [email or web address]  [facility] -  Office  [phone]   Voice Recognition Viviann Spare dictation system was used to create this note, attempts have been made to correct errors. Please contact the author with questions and/or clarifications.

## 2017-06-02 LAB — CBC
HCT: 30.9 % — ABNORMAL LOW (ref 36.0–46.0)
Hemoglobin: 9.8 g/dL — ABNORMAL LOW (ref 12.0–15.0)
MCH: 32.6 pg (ref 26.0–34.0)
MCHC: 31.7 g/dL (ref 30.0–36.0)
MCV: 102.7 fL — AB (ref 78.0–100.0)
Platelets: 232 10*3/uL (ref 150–400)
RBC: 3.01 MIL/uL — ABNORMAL LOW (ref 3.87–5.11)
RDW: 16.9 % — ABNORMAL HIGH (ref 11.5–15.5)
WBC: 2.9 10*3/uL — AB (ref 4.0–10.5)

## 2017-06-02 LAB — GLUCOSE, CAPILLARY
GLUCOSE-CAPILLARY: 140 mg/dL — AB (ref 65–99)
GLUCOSE-CAPILLARY: 165 mg/dL — AB (ref 65–99)
Glucose-Capillary: 142 mg/dL — ABNORMAL HIGH (ref 65–99)
Glucose-Capillary: 235 mg/dL — ABNORMAL HIGH (ref 65–99)

## 2017-06-02 LAB — BASIC METABOLIC PANEL
ANION GAP: 7 (ref 5–15)
BUN: 24 mg/dL — AB (ref 6–20)
CHLORIDE: 86 mmol/L — AB (ref 101–111)
CO2: 40 mmol/L — ABNORMAL HIGH (ref 22–32)
Calcium: 8.3 mg/dL — ABNORMAL LOW (ref 8.9–10.3)
Creatinine, Ser: 0.39 mg/dL — ABNORMAL LOW (ref 0.44–1.00)
GFR calc Af Amer: >60 mL/min (ref >60)
GFR calc non Af Amer: >60 mL/min (ref >60)
Glucose, Bld: 171 mg/dL — ABNORMAL HIGH (ref 65–99)
POTASSIUM: 3.7 mmol/L (ref 3.5–5.1)
SODIUM: 133 mmol/L — AB (ref 135–145)

## 2017-06-02 NOTE — Progress Notes (Signed)
[name] Demographics:    [name], is a 72 y.o. female, DOB - April 17, 1946, [id number]  Admit date - 05/27/2017   Admitting Physician [name], MD  Outpatient Primary MD for the [name] is [name], MD  LOS - 6   Chief Complaint  [name] presents with  . Altered Mental Status        Subjective:    [name] today has no fevers, no emesis,  No chest pain, diarrhea is better, no productive cough,  Sob is better,   Down to 5 L/min [place], no HAs   Assessment  & Plan :    Principal Problem:   Acute on chronic diastolic CHF (congestive heart failure) (HCC) Active Problems:   Type 2 diabetes mellitus (HCC)   Acute on chronic respiratory failure with hypoxia and hypercapnia (HCC)   SVC syndrome   Malignant neoplasm of bronchus of right upper lobe (HCC)   PAF (paroxysmal atrial fibrillation) ([place])  Brief Narrative: [name] is a 72 year old female with past medical history of COPD, chronic diastolic CHF, chronic hypoxic respiratory failure, insulin-dependent diabetes, depression, paroxysmal A. 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  [name] had been receiving her  chemotherapy infusion when she became lethargic and hypoxic and sent to emergency department for further evaluation.  Chest x-ray on admission was suggestive of exacerbation of CHF and she has been currently managed for that.   Plan:- 1)Acute on chronic hypercarbic/hypoxic respiratory failure: Continues to improve, [name] is now on 5 L by nasal cannula , likely contributed by CHF and COPD-was 12 L  3 days ago, PTA [name] was on 2.5 L of oxygen at home, continue prednisone 20 mg daily and Lasix 40 mg IV daily  2)HFpEF-overall improving with diuresis, Oxygen requirement is better as noted in #1 above,   [name] with history of CHF with preserved ejection fraction, last known EF is 55-60%, continue IV Lasix 40 mg daily, prior to admission [name] was on Lasix 20 mg p.o. Daily.  Daily weight and fluid input and output monitoring.  [name] does not have significant lower extremity edema, however she does have bilateral upper extremity edema due to SVC syndrome in the setting of lung malignancy  3)Acute COPD exacerbation:- improving above #1, c/n prednisone to 20 mg daily, continue bronchodilators, continue to wean down supplemental oxygen  4)Stage IV non-small cell lung cancer: Follows with Dr. Julien Nordmann.  Was on chemotherapy.  Status post palliative radiotherapy.  Has history of SVC syndrome.  Became hypoxic during her infusion with Taxol and carboplatin on 05/27/17.  She will follow-up with oncology as an outpatient.  5)Paroxysmal A. fib: Continue stroke prophylaxis/anticoagulation with Eliquis and Cardizem for rate control  6)Type 2 diabetes mellitus: Previously uncontrolled, Hemoglobin A1c in November 2018 was 10.6, worsening her hyperglycemia due to steroids, should improve with tapering steroids, improved with Lantus insulin to 12 units nightly ,  diabetic educator recommendations noted.  Hold off on scheduled mealtime insulin at this time as [name]'s oral intake is erratic and she is having loose stools with risk for hypoglycemia  7)Hypertension:  Stable, c/n Losartan.  8)Depression: Mood is stable.  Continue Celexa  9)Diarrhea-improving, C. difficile negative, may use Imodium as needed  10)Leukopenia-suspect chemo-induced leukopenia, improving ( WBC is 2.9),  last chemoRx 05/27/17,   no fevers  Transfer  out of stepdown unit to telemetry unit  DVT prophylaxis:Eliquis Code Status: Full Family Communication:  Plan of care discussed with husband  at the bedside Disposition Plan: Home when oxygen requirement improved   Lab Results  Component Value Date   PLT 232 06/02/2017     Inpatient Medications  Scheduled Meds: . apixaban  5 mg Oral BID  . arformoterol  15 mcg Nebulization BID  . atorvastatin  20 mg Oral q1800  . citalopram  20 mg Oral Daily  . diltiazem  360 mg Oral Daily  . furosemide  40 mg Intravenous Daily  . guaiFENesin  1,200 mg Oral BID  . insulin aspart  0-5 Units Subcutaneous QHS  . insulin aspart  0-9 Units Subcutaneous TID WC  . insulin glargine  12 Units Subcutaneous QHS  . losartan  50 mg Oral Daily  . mouth rinse  15 mL Mouth Rinse BID  . nystatin  5 mL Oral TID  . predniSONE  20 mg Oral Q breakfast  . sodium chloride flush  3 mL Intravenous Q12H  . umeclidinium bromide  1 puff Inhalation Daily   Continuous Infusions: . sodium chloride 250 mL (06/02/17 0600)   PRN Meds:.sodium chloride, acetaminophen, ipratropium-albuterol, loperamide, ondansetron (ZOFRAN) IV, sodium chloride flush   Anti-infectives (From admission, onward)   None       Objective:   Vitals:   06/02/17 0600 06/02/17 0800 06/02/17 1004 06/02/17 1021  BP: (!) 110/26 (!) 107/36 (!) 132/40   Pulse: 80 (!) 102    Resp: 14 (!) 45    Temp:  98 F (36.7 C)    TempSrc:  Oral    SpO2: 98% 92%  98%  Weight: 51.5 kg (113 lb 8.6 oz)     Height:        Wt Readings from Last 3 Encounters:  06/02/17 51.5 kg (113 lb 8.6 oz)  05/27/17 57.2 kg (126 lb 1.6 oz)  05/10/17 58.2 kg (128 lb 3.2 oz)    Intake/Output Summary (Last 24 hours) at 06/02/2017 1330 Last data filed at 06/02/2017 0800 Gross per 24 hour  Intake 1000.83 ml  Output 700 ml  Net 300.83 ml     Physical Exam  Gen:- Awake Alert,  In no apparent distress  HEENT:- [place].AT, No sclera icterus , [place] 5 L /min Neck-Supple Neck,No JVD,.  Lungs-  Diminished in bases with bibasilar rales CV- S1, S2 normal, irregular Abd-  +ve B.Sounds, Abd Soft, No tenderness,    Extremity/Skin:- improved Bil UE  edema, no leg edema ([name] does not have significant lower extremity edema, however she does have bilateral  upper extremity edema due to SVC syndrome in the setting of lung malignancy) Psych- affect is appropriate Neuro-generalized weakness without new focal deficits  Data Review:   Micro Results Recent Results (from the past 240 hour(s))  MRSA PCR Screening     Status: None   Collection Time: 05/29/17  8:40 AM  Result Value Ref Range Status   MRSA by PCR NEGATIVE NEGATIVE Final    Comment:        The GeneXpert MRSA Assay (FDA approved for NASAL specimens only), is one component of a comprehensive MRSA colonization surveillance program. It is not intended to diagnose MRSA infection nor to guide or monitor treatment for MRSA infections.  C difficile quick scan w PCR reflex     Status: None   Collection Time: 05/29/17 10:11 AM  Result Value Ref Range Status   C Diff antigen NEGATIVE NEGATIVE Final   C Diff toxin NEGATIVE NEGATIVE Final   C Diff interpretation No C. difficile detected.  Final    Comment: NEGATIVE    Radiology Reports Dg Chest Port 1 View  Result Date: 05/31/2017 CLINICAL DATA:  Shortness of breath. EXAM: PORTABLE CHEST 1 VIEW COMPARISON:  Radiograph of May 27, 2017. FINDINGS: Stable cardiomediastinal silhouette no pneumothorax is noted. Stable bilateral pleural effusions are noted with probable associated atelectasis. Atherosclerosis of thoracic aorta is noted. Bony thorax is unremarkable. IMPRESSION: Aortic atherosclerosis. Stable bilateral pleural effusions with associated atelectasis. Electronically Signed   By: Marijo Conception, M.D.   On: 05/31/2017 07:11   Dg Chest Port 1 View  Result Date: 05/27/2017 CLINICAL DATA:  Dyspnea and lethargy EXAM: PORTABLE CHEST 1 VIEW COMPARISON:  04/24/2017 FINDINGS: Obscuration of the diaphragms consistent with small to moderate bilateral pleural effusions. Low lung volumes with vascular congestion. Aortic atherosclerosis at the arch without definite aneurysm. The heart is partially obscured by the pleural effusions. No acute  osseous abnormality. ACDF of the lower cervical spine. IMPRESSION: Low lung volumes with vascular congestion and small to moderate bilateral pleural effusions consistent with stigmata of CHF. Electronically Signed   By: Ashley Royalty M.D.   On: 05/27/2017 22:35     CBC Recent Labs  Lab 05/27/17 1053 05/31/17 0302 06/01/17 0315 06/02/17 0842  WBC 5.2 1.0* 2.1* 2.9*  HGB 10.7* 10.8* 10.3* 9.8*  HCT 33.0* 34.1* 32.5* 30.9*  PLT 261 238 238 232  MCV 101.2* 102.7* 101.9* 102.7*  MCH 32.8 32.5 32.3 32.6  MCHC 32.4 31.7 31.7 31.7  RDW 19.7* 17.2* 16.7* 16.9*  LYMPHSABS 0.2*  --  0.1*  --   MONOABS 0.6  --  0.1  --   EOSABS 0.2  --  0.1  --   BASOSABS 0.0  --  0.0  --     Chemistries  Recent Labs  Lab 05/27/17 1053  05/29/17 0317 05/30/17 0255 05/31/17 0302 06/01/17 0315 06/02/17 0842  NA 141   < > 138 138 136 135 133*  K 3.7   < > 4.1 4.2 3.9 4.1 3.7  CL  --    < > 93* 93* 91* 89* 86*  CO2 37*   < > 38* 39* 39* 40* 40*  GLUCOSE 165*   < > 204* 157* 233* 115* 171*  BUN 16.5   < > 29* 27* 30* 29* 24*  CREATININE 0.5*   < > 0.38* <0.30* 0.37* 0.45 0.39*  CALCIUM 8.9   < > 8.3* 8.4* 8.6* 8.3* 8.3*  AST 8  --   --   --   --   --   --   ALT 12  --   --   --   --   --   --   ALKPHOS 57  --   --   --   --   --   --   BILITOT 0.37  --   --   --   --   --   --    < > = values in this interval not displayed.   ------------------------------------------------------------------------------------------------------------------ No results for input(s): CHOL, HDL, LDLCALC, TRIG, CHOLHDL, LDLDIRECT in the last 72 hours.  Lab Results  Component Value Date   HGBA1C 10.6 (H) 04/11/2017   ------------------------------------------------------------------------------------------------------------------  No results for input(s): TSH, T4TOTAL, T3FREE, THYROIDAB in the last 72 hours.  Invalid input(s):  FREET3 ------------------------------------------------------------------------------------------------------------------ No results for input(s): VITAMINB12, FOLATE, FERRITIN, TIBC, IRON, RETICCTPCT in the last 72 hours.  Coagulation profile No results for input(s): INR, PROTIME in the last 168 hours.  No results for input(s): DDIMER in the last 72 hours.  Cardiac Enzymes No results for input(s): CKMB, TROPONINI, MYOGLOBIN in the last 168 hours.  Invalid input(s): CK ------------------------------------------------------------------------------------------------------------------    Component Value Date/Time   BNP 15.0 02/22/2017 2249     Miel Wisener M.D on 06/02/2017 at 1:30 PM  Between 7am to 7pm - Pager - [phone]  After 7pm go to [email or web address] - password [email or web address]  [facility] -  Office  [phone]   Voice Recognition Viviann Spare dictation system was used to create this note, attempts have been made to correct errors. Please contact the author with questions and/or clarifications.

## 2017-06-03 LAB — CBC WITH DIFFERENTIAL/PLATELET
BASOS PCT: 0 %
Basophils Absolute: 0 10*3/uL (ref 0.0–0.1)
EOS ABS: 0.3 10*3/uL (ref 0.0–0.7)
EOS PCT: 7 %
HCT: 31.7 % — ABNORMAL LOW (ref 36.0–46.0)
Hemoglobin: 10 g/dL — ABNORMAL LOW (ref 12.0–15.0)
LYMPHS PCT: 11 %
Lymphs Abs: 0.4 10*3/uL — ABNORMAL LOW (ref 0.7–4.0)
MCH: 32.1 pg (ref 26.0–34.0)
MCHC: 31.5 g/dL (ref 30.0–36.0)
MCV: 101.6 fL — ABNORMAL HIGH (ref 78.0–100.0)
MONO ABS: 0.4 10*3/uL (ref 0.1–1.0)
Monocytes Relative: 9 %
NEUTROS ABS: 2.8 10*3/uL (ref 1.7–7.7)
Neutrophils Relative %: 73 %
PLATELETS: 248 10*3/uL (ref 150–400)
RBC: 3.12 MIL/uL — ABNORMAL LOW (ref 3.87–5.11)
RDW: 16.6 % — AB (ref 11.5–15.5)
WBC: 3.8 10*3/uL — AB (ref 4.0–10.5)

## 2017-06-03 LAB — BASIC METABOLIC PANEL
Anion gap: 9 (ref 5–15)
BUN: 17 mg/dL (ref 6–20)
CALCIUM: 8.2 mg/dL — AB (ref 8.9–10.3)
CO2: 39 mmol/L — ABNORMAL HIGH (ref 22–32)
CREATININE: 0.32 mg/dL — AB (ref 0.44–1.00)
Chloride: 86 mmol/L — ABNORMAL LOW (ref 101–111)
Glucose, Bld: 132 mg/dL — ABNORMAL HIGH (ref 65–99)
Potassium: 3.2 mmol/L — ABNORMAL LOW (ref 3.5–5.1)
SODIUM: 134 mmol/L — AB (ref 135–145)

## 2017-06-03 LAB — GLUCOSE, CAPILLARY
GLUCOSE-CAPILLARY: 97 mg/dL (ref 65–99)
GLUCOSE-CAPILLARY: 278 mg/dL — AB (ref 65–99)
GLUCOSE-CAPILLARY: 221 mg/dL — AB (ref 65–99)
Glucose-Capillary: 199 mg/dL — ABNORMAL HIGH (ref 65–99)

## 2017-06-03 NOTE — Progress Notes (Signed)
Date:  June 03, 2017 Chart reviewed for concurrent status and case management needs.  Will continue to follow patient progress.  Discharge Planning: following for needs  Expected discharge date: June 06 2017 Velva Harman, BSN, [place], [place]

## 2017-06-03 NOTE — Progress Notes (Addendum)
Chaplain follow up around consult for Advance Directive.  Pt already has living will.  Asks questions about health care power of attorney.  Understands that default HCPOA is spouse and she does wish for her spouse to be HCPOA.  Decided not to complete advance directive.   Chaplain recommended bringing living will to medical records to be scanned into chart.      Pt has previous living will   HCPOA is pt's spouse, [name]

## 2017-06-03 NOTE — Progress Notes (Signed)
Patient Demographics:    [name], is a 72 y.o. female, DOB - 05-16-1946, [id number]:[id number]  Admit date - 05/27/2017   Admitting Physician Kelsey Bulls, MD  Outpatient Primary MD for the patient is Kelsey Lass, MD  LOS - 7   Chief Complaint  Patient presents with  . Altered Mental Status        Subjective:    [name] today has no fevers, no emesis,  No chest pain, diarrhea is better, no productive cough,  Sob is better,   Down to 5 L/min [place], no HAs, husband , chaplain and RN at bedside   Assessment  & Plan :    Principal Problem:   Acute on chronic diastolic CHF (congestive heart failure) (HCC) Active Problems:   Type 2 diabetes mellitus (HCC)   Acute on chronic respiratory failure with hypoxia and hypercapnia (HCC)   SVC syndrome   Malignant neoplasm of bronchus of right upper lobe (HCC)   PAF (paroxysmal atrial fibrillation) ([place])  Brief Narrative: Patient is a 72 year old female with past medical history of COPD, chronic diastolic CHF, chronic hypoxic respiratory failure, insulin-dependent diabetes, depression, paroxysmal A. 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  Patient had been receiving her  chemotherapy infusion when she became lethargic and hypoxic and sent to emergency department for further evaluation.  Chest x-ray on admission was suggestive of exacerbation of CHF and she has been currently managed for that.   Plan:- 1)Acute on chronic hypercarbic/hypoxic respiratory failure: Continues to improve, patient is on 5 L by nasal cannula , likely contributed by CHF and COPD-was 12 L  3 days ago, PTA patient was on 2.5 L of oxygen at home, continue prednisone 20 mg daily and  Lasix 40 mg IV daily  2)HFpEF- overall improving with diuresis, Oxygen  requirement is better as noted in #1 above,  patient with history of CHF with preserved ejection fraction, last known EF is 55-60%, continue IV Lasix 40 mg daily, prior to admission patient was on Lasix 20 mg p.o. Daily.  Daily weight and fluid input and output monitoring.  Patient does not have significant lower extremity edema, however she does have bilateral upper extremity edema due to SVC syndrome in the setting of lung malignancy  3)Acute COPD exacerbation:- improving above #1, c/n prednisone to 20 mg daily, continue bronchodilators, continue to wean down supplemental oxygen  4)Stage IV non-small cell lung cancer: Follows with Dr. Julien Baxter.  Was on chemotherapy.  Status post palliative radiotherapy.  Has history of SVC syndrome.  Became hypoxic during her infusion with Taxol and carboplatin on 05/27/17.  She will follow-up with oncology as an outpatient.  5)Paroxysmal A. fib: Continue stroke prophylaxis/anticoagulation with Eliquis and Cardizem for rate control  6)Type 2 diabetes mellitus: Previously uncontrolled, Hemoglobin A1c in November 2018 was 10.6, worsening her hyperglycemia due to steroids, should improve with tapering steroids, improved with Lantus insulin to 12 units nightly ,  diabetic educator recommendations noted.  Hold off on scheduled mealtime insulin at this time as patient's oral intake is erratic and she is having loose stools with risk for hypoglycemia  7)Hypertension:  Stable,  c/n Losartan.  8)Depression: Mood is stable.  Continue Celexa  9)Diarrhea-improving, C. difficile negative, may use Imodium as needed  10)Leukopenia-suspect chemo-induced leukopenia, improving ( WBC is 3.8),  last chemoRx 05/27/17,   no fevers  11)Disposition-patient and husband are agreeable to transfer to skilled nursing facility for rehab prior to returning home, PT OT eval pending   Transfer  out of stepdown unit to telemetry unit  DVT prophylaxis:Eliquis Code Status: Full Family  Communication:  Plan of care discussed with husband  at the bedside Disposition Plan: SNF   Lab Results  Component Value Date   PLT 248 06/03/2017    Inpatient Medications  Scheduled Meds: . apixaban  5 mg Oral BID  . arformoterol  15 mcg Nebulization BID  . atorvastatin  20 mg Oral q1800  . citalopram  20 mg Oral Daily  . diltiazem  360 mg Oral Daily  . furosemide  40 mg Intravenous Daily  . guaiFENesin  1,200 mg Oral BID  . insulin aspart  0-5 Units Subcutaneous QHS  . insulin aspart  0-9 Units Subcutaneous TID WC  . insulin glargine  12 Units Subcutaneous QHS  . losartan  50 mg Oral Daily  . mouth rinse  15 mL Mouth Rinse BID  . nystatin  5 mL Oral TID  . predniSONE  20 mg Oral Q breakfast  . sodium chloride flush  3 mL Intravenous Q12H  . umeclidinium bromide  1 puff Inhalation Daily   Continuous Infusions: . sodium chloride 250 mL (06/03/17 0500)   PRN Meds:.sodium chloride, acetaminophen, ipratropium-albuterol, loperamide, ondansetron (ZOFRAN) IV, sodium chloride flush   Anti-infectives (From admission, onward)   None       Objective:   Vitals:   06/03/17 1200 06/03/17 1400 06/03/17 1600 06/03/17 1849  BP: (!) 126/39 (!) 103/11 (!) 124/15 (!) 142/46  Pulse: 99 88 91 95  Resp: (!) 31 (!) 32 (!) 35 (!) 26  Temp: 98.3 F (36.8 C)  97.8 F (36.6 C) 97.6 F (36.4 C)  TempSrc: Oral  Oral Oral  SpO2: 95% 94% 96% 93%  Weight:      Height:        Wt Readings from Last 3 Encounters:  06/03/17 51.2 kg (112 lb 14 oz)  05/27/17 57.2 kg (126 lb 1.6 oz)  05/10/17 58.2 kg (128 lb 3.2 oz)    Intake/Output Summary (Last 24 hours) at 06/03/2017 2008 Last data filed at 06/03/2017 1600 Gross per 24 hour  Intake 230 ml  Output 1000 ml  Net -770 ml     Physical Exam  Gen:- Awake Alert,  In no apparent distress  HEENT:- [place].AT, No sclera icterus , [place] 5 L /min Neck-Supple Neck,No JVD,.  Lungs-  Diminished in bases with bibasilar rales CV- S1, S2 normal,  irregular Abd-  +ve B.Sounds, Abd Soft, No tenderness,    Extremity/Skin:- improved Bil UE  edema, no leg edema (Patient does not have significant lower extremity edema, however she does have bilateral upper extremity edema due to SVC syndrome in the setting of lung malignancy) Psych- affect is appropriate Neuro-generalized weakness without new focal deficits  Data Review:   Micro Results Recent Results (from the past 240 hour(s))  MRSA PCR Screening     Status: None   Collection Time: 05/29/17  8:40 AM  Result Value Ref Range Status   MRSA by PCR NEGATIVE NEGATIVE Final    Comment:        The GeneXpert MRSA Assay (FDA approved for  NASAL specimens only), is one component of a comprehensive MRSA colonization surveillance program. It is not intended to diagnose MRSA infection nor to guide or monitor treatment for MRSA infections.   C difficile quick scan w PCR reflex     Status: None   Collection Time: 05/29/17 10:11 AM  Result Value Ref Range Status   C Diff antigen NEGATIVE NEGATIVE Final   C Diff toxin NEGATIVE NEGATIVE Final   C Diff interpretation No C. difficile detected.  Final    Comment: NEGATIVE    Radiology Reports Dg Chest Port 1 View  Result Date: 05/31/2017 CLINICAL DATA:  Shortness of breath. EXAM: PORTABLE CHEST 1 VIEW COMPARISON:  Radiograph of May 27, 2017. FINDINGS: Stable cardiomediastinal silhouette no pneumothorax is noted. Stable bilateral pleural effusions are noted with probable associated atelectasis. Atherosclerosis of thoracic aorta is noted. Bony thorax is unremarkable. IMPRESSION: Aortic atherosclerosis. Stable bilateral pleural effusions with associated atelectasis. Electronically Signed   By: Marijo Conception, M.D.   On: 05/31/2017 07:11   Dg Chest Port 1 View  Result Date: 05/27/2017 CLINICAL DATA:  Dyspnea and lethargy EXAM: PORTABLE CHEST 1 VIEW COMPARISON:  04/24/2017 FINDINGS: Obscuration of the diaphragms consistent with small to  moderate bilateral pleural effusions. Low lung volumes with vascular congestion. Aortic atherosclerosis at the arch without definite aneurysm. The heart is partially obscured by the pleural effusions. No acute osseous abnormality. ACDF of the lower cervical spine. IMPRESSION: Low lung volumes with vascular congestion and small to moderate bilateral pleural effusions consistent with stigmata of CHF. Electronically Signed   By: Ashley Royalty M.D.   On: 05/27/2017 22:35     CBC Recent Labs  Lab 05/31/17 0302 06/01/17 0315 06/02/17 0842 06/03/17 0931  WBC 1.0* 2.1* 2.9* 3.8*  HGB 10.8* 10.3* 9.8* 10.0*  HCT 34.1* 32.5* 30.9* 31.7*  PLT 238 238 232 248  MCV 102.7* 101.9* 102.7* 101.6*  MCH 32.5 32.3 32.6 32.1  MCHC 31.7 31.7 31.7 31.5  RDW 17.2* 16.7* 16.9* 16.6*  LYMPHSABS  --  0.1*  --  0.4*  MONOABS  --  0.1  --  0.4  EOSABS  --  0.1  --  0.3  BASOSABS  --  0.0  --  0.0    Chemistries  Recent Labs  Lab 05/30/17 0255 05/31/17 0302 06/01/17 0315 06/02/17 0842 06/03/17 0931  NA 138 136 135 133* 134*  K 4.2 3.9 4.1 3.7 3.2*  CL 93* 91* 89* 86* 86*  CO2 39* 39* 40* 40* 39*  GLUCOSE 157* 233* 115* 171* 132*  BUN 27* 30* 29* 24* 17  CREATININE <0.30* 0.37* 0.45 0.39* 0.32*  CALCIUM 8.4* 8.6* 8.3* 8.3* 8.2*   ------------------------------------------------------------------------------------------------------------------ No results for input(s): CHOL, HDL, LDLCALC, TRIG, CHOLHDL, LDLDIRECT in the last 72 hours.  Lab Results  Component Value Date   HGBA1C 10.6 (H) 04/11/2017   ------------------------------------------------------------------------------------------------------------------ No results for input(s): TSH, T4TOTAL, T3FREE, THYROIDAB in the last 72 hours.  Invalid input(s): FREET3 ------------------------------------------------------------------------------------------------------------------ No results for input(s): VITAMINB12, FOLATE, FERRITIN, TIBC, IRON,  RETICCTPCT in the last 72 hours.  Coagulation profile No results for input(s): INR, PROTIME in the last 168 hours.  No results for input(s): DDIMER in the last 72 hours.  Cardiac Enzymes No results for input(s): CKMB, TROPONINI, MYOGLOBIN in the last 168 hours.  Invalid input(s): CK ------------------------------------------------------------------------------------------------------------------    Component Value Date/Time   BNP 15.0 02/22/2017 2249     Roxan Hockey M.D on 06/03/2017 at 8:08 PM  Between 7am  to 7pm - Pager - [phone]  After 7pm go to [email or web address] - password [email or web address]  [facility] -  Office  [phone]   Voice Recognition Viviann Spare dictation system was used to create this note, attempts have been made to correct errors. Please contact the author with questions and/or clarifications.

## 2017-06-03 NOTE — Plan of Care (Signed)
  Progressing Education: Knowledge of General Education information will improve 06/03/2017 1716 - Progressing by Naomie Dean, RN Clinical Measurements: Will remain free from infection 06/03/2017 1716 - Progressing by Naomie Dean, RN Diagnostic test results will improve 06/03/2017 1716 - Progressing by Naomie Dean, RN Respiratory complications will improve 06/03/2017 1716 - Progressing by Naomie Dean, RN Cardiovascular complication will be avoided 06/03/2017 1716 - Progressing by Naomie Dean, RN Nutrition: Adequate nutrition will be maintained 06/03/2017 1716 - Progressing by Naomie Dean, RN Coping: Level of anxiety will decrease 06/03/2017 1716 - Progressing by Naomie Dean, RN Elimination: Will not experience complications related to bowel motility 06/03/2017 1716 - Progressing by Naomie Dean, RN Will not experience complications related to urinary retention 06/03/2017 1716 - Progressing by Naomie Dean, RN Pain Managment: General experience of comfort will improve 06/03/2017 1716 - Progressing by Naomie Dean, RN Safety: Ability to remain free from injury will improve 06/03/2017 1716 - Progressing by Naomie Dean, RN Skin Integrity: Risk for impaired skin integrity will decrease 06/03/2017 1716 - Progressing by Naomie Dean, RN   Not Progressing Activity: Risk for activity intolerance will decrease 06/03/2017 1716 - Not Progressing by Naomie Dean, RN  Pt will require PT/OT/rehab Adequate for Discharge Health Behavior/Discharge Planning: Ability to manage health-related needs will improve 06/03/2017 1716 - Adequate for Discharge by Naomie Dean, RN

## 2017-06-04 ENCOUNTER — Encounter (HOSPITAL_COMMUNITY): Payer: Self-pay

## 2017-06-04 LAB — GLUCOSE, CAPILLARY
GLUCOSE-CAPILLARY: 79 mg/dL (ref 65–99)
GLUCOSE-CAPILLARY: 259 mg/dL — AB (ref 65–99)
GLUCOSE-CAPILLARY: 257 mg/dL — AB (ref 65–99)
Glucose-Capillary: 200 mg/dL — ABNORMAL HIGH (ref 65–99)

## 2017-06-04 NOTE — Evaluation (Signed)
Physical Therapy Evaluation Patient Details Name: [name] MRN: [id number] DOB: 1946-05-11 Today's Date: 06/04/2017   History of Present Illness  72 y.o.femalewith a past medical history of COPD,diet-controlled T2DM,right lung mass highly suspicious for stage IV non-small cell lung cancer who is currently receiving radiation treatment. 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 She presented to the cancer center for routine follow-up, complaining of severe fatigue and intermittently lightheaded.Her blood glucose level was checked and was noted to be greater than 600.; pt sent to ED and while being examined pt went t into afib with RVR  Clinical Impression  Limited eval on today. Co-tx with OT. Pt was able to sit EOB with Min guard assist. Once EOB, assessed pt's O2 saturation level. Reading was in 40-50s% on 4L [place] O2. Increased O2 and notified RN who assessed pt and took over care. RN called respiratory therapy to assess pt as well. Will continue to follow. At this time, recommendation is for ST rehab at SNF once pt is medically stable. Will continue to assess pt's ability to participate/tolerate activity progression.     Follow Up Recommendations SNF(depending on progress)    Equipment Recommendations  None recommended by PT    Recommendations for Other Services       Precautions / Restrictions Precautions Precautions: Fall Precaution Comments: watch O2 Sats Restrictions Weight Bearing Restrictions: No      Mobility  Bed Mobility Overal bed mobility: Needs Assistance Bed Mobility: Supine to Sit;Sit to Supine     Supine to sit: Supervision Sit to supine: Supervision   General bed mobility comments: for safety, lines. Deferred any further activity due to O2 sat reading (49% on 4L [place] O2). Notified RN. Assisted pt back to bed.   Transfers Overall transfer level: Needs assistance               General  transfer comment: "It took 2 people to get me to the chair yesterday". Not attmepted due to respiratory status  Ambulation/Gait                Stairs            Wheelchair Mobility    Modified Rankin (Stroke Patients Only)       Balance                                             Pertinent Vitals/Pain Pain Assessment: No/denies pain    Home Living Family/patient expects to be discharged to:: Private residence Living Arrangements: Spouse/significant other Available Help at Discharge: Family Type of Home: House Home Access: Ramped entrance     Home Layout: One level [place]: Wheelchair - [employer] - 2 wheels;Bedside commode;Shower seat      Prior Function Level of Independence: Needs assistance   Gait / Transfers Assistance Needed: using walker inside home. wheelchair outside of home.   ADL's / Homemaking Assistance Needed: sponge bathing prior to admission  Comments: unable to take a shower due to fatigue; only walking from bedroom to bathroom     Hand Dominance   Dominant Hand: Right    Extremity/Trunk Assessment   Upper Extremity Assessment Upper Extremity Assessment: Defer to OT evaluation    Lower Extremity Assessment Lower Extremity Assessment: Generalized weakness    Cervical / Trunk Assessment Cervical / Trunk Assessment:  Normal  Communication   Communication: No difficulties  Cognition Arousal/Alertness: Awake/alert Behavior During Therapy: WFL for tasks assessed/performed Overall Cognitive Status: Within Functional Limits for tasks assessed                                        General Comments      Exercises     Assessment/Plan    PT Assessment Patient needs continued PT services  PT Problem List Decreased mobility;Decreased strength;Decreased balance;Decreased activity tolerance;Cardiopulmonary status limiting activity       PT Treatment Interventions DME instruction;Gait  training;Functional mobility training;Balance training;Patient/family education;Therapeutic exercise;Therapeutic activities    PT Goals (Current goals can be found in the Care Plan section)  Acute Rehab PT Goals Patient Stated Goal: to get better and be able to do more PT Goal Formulation: With patient Time For Goal Achievement: 06/18/17 Potential to Achieve Goals: Fair    Frequency Min 3X/week   Barriers to discharge        Co-evaluation PT/OT/SLP Co-Evaluation/Treatment: Yes Reason for Co-Treatment: Complexity of the patient's impairments (multi-system involvement);For patient/therapist safety PT goals addressed during session: Mobility/safety with mobility OT goals addressed during session: ADL's and self-care       AM-PAC PT "6 Clicks" Daily Activity  Outcome Measure Difficulty turning over in bed (including adjusting bedclothes, sheets and blankets)?: Unable Difficulty moving from lying on back to sitting on the side of the bed? : Unable Difficulty sitting down on and standing up from a chair with arms (e.g., wheelchair, bedside commode, etc,.)?: Unable Help needed moving to and from a bed to chair (including a wheelchair)?: A Lot Help needed walking in hospital room?: A Lot Help needed climbing 3-5 steps with a railing? : Total 6 Click Score: 8    End of Session   Activity Tolerance: (limited by cardiopulmonary status) Patient left: in bed;with nursing/sitter in room   PT Visit Diagnosis: Muscle weakness (generalized) (M62.81);Difficulty in walking, not elsewhere classified (R26.2)    Time: 2703-5009 PT Time Calculation (min) (ACUTE ONLY): 25 min   Charges:   PT Evaluation $PT Eval Moderate Complexity: 1 Mod     PT G Codes:          Weston Anna, MPT Pager: [phone]

## 2017-06-04 NOTE — Progress Notes (Signed)
Rt called to room 1437 pt sats 77% on 15 LPM HF. Rt placed NRB on pt sats 92%. MD paged by RN. No further orders for RT at this time.

## 2017-06-04 NOTE — Clinical Social Work Note (Signed)
Clinical Social Work Assessment  Patient Details  Name: [name] MRN: [id number] Date of Birth: [birth year]  Date of referral:  06/04/17               Reason for consult:  Facility Placement                Permission sought to share information with:  Family Supports Permission granted to share information::  Yes, Verbal Permission Granted  Name::     husband Manufacturing systems engineer::     Relationship::     Contact Information:     Housing/Transportation Living arrangements for the past 2 months:  Pax of Information:  Patient, Medical Team Patient Interpreter Needed:  None Criminal Activity/Legal Involvement Pertinent to Current Situation/Hospitalization:  No - Comment as needed Significant Relationships:  Spouse Lives with:  Self, Spouse Do you feel safe going back to the place where you live?  Yes Need for family participation in patient care:  Yes (Comment)(pt states her husband is involved in decisions)  Care giving concerns:  Pt resides at home with her husband. Began chemotherapy last month (one treatment) and has not been able to continue due to respiratory issues she reports.  Prior to admission states she could get out of bed and walk to next room alone but was largely staying in bed due to "breathing and fatigue."   Social Worker assessment / plan:  CSW consulted to assess need for SNF referrals.  Met with pt at bedside. She is alert/oriented x 4. Worked with therapy earlier today and states she understands the recommendation that she meets SNF level of care. However, she is undecided in her wishes, explaining that she wants to know if she "should go the rehab route or should I be with hospice at this point. I don't know if I really have any more treatment options for the cancer that are good." Pt states she and her husband will discuss her treatment options with her providers. CSW will follow up once pt has received the information she needs to make decision  on desired disposition plan.   Employment status:    Nurse, adult PT Recommendations:  [place] / Referral to community resources:     Patient/Family's Response to care:  Pt expresses she is appreciative of care at [employer] [facility] and cancer center  Patient/Family's Understanding of and Emotional Response to Diagnosis, Current Treatment, and Prognosis:  Pt demonstrates adequate understanding of her status and is clear re: the questions she needs answered in order to move forward with her care decisions. Pt states, "I need to know my options, I want to do what is reasonable for me at this point." Emotionally seems well-adjusted.   Emotional Assessment Appearance:  Appears stated age Attitude/Demeanor/Rapport:  (engaged, appropriate) Affect (typically observed):  Calm, Frustrated Orientation:  Oriented to Self, Oriented to Place, Oriented to  Time, Oriented to Situation Alcohol / Substance use:  Not Applicable Psych involvement (Current and /or in the community):  No (Comment)  Discharge Needs  Concerns to be addressed:  Discharge Planning Concerns, Care Coordination, Decision making concerns Readmission within the last 30 days:  No Current discharge risk:  (still assessing) Barriers to Discharge:  Continued Medical Work up   [employer], LCSW 06/04/2017, 2:51 PM  [phone]

## 2017-06-04 NOTE — Progress Notes (Signed)
Acknowledged consult for SNF placement. Reviewed chart indicating pt/family agreeable to SNF, however pt has not yet been able to participate in PT/OT eval, which will be needed to determine appropriate level of care and initiate insurance authorization request. Will follow and complete full assessment once care level details known.  Sharren Bridge, MSW, LCSW Clinical Social Work 06/04/2017 [phone]

## 2017-06-04 NOTE — Progress Notes (Signed)
PT Cancellation Note  Patient Details Name: [name] MRN: [id number] DOB: 01-15-1946   Cancelled Treatment:    Reason Eval/Treat Not Completed: Medical issues which prohibited therapy. Noted elevated HR at rest 110s-120s. Will hold PT for now and check back another time.     Weston Anna, MPT Pager: [phone]

## 2017-06-04 NOTE — Care Management Important Message (Signed)
Important Message  Patient Details  Name: [name] MRN: [id number] Date of Birth: [birth year]   Medicare Important Message Given:  Yes    Kerin Salen 06/04/2017, 10:26 AMImportant Message  Patient Details  Name: [name] MRN: [id number] Date of Birth: 20-Mar-1946   Medicare Important Message Given:  Yes    Kerin Salen 06/04/2017, 10:26 AM

## 2017-06-04 NOTE — Progress Notes (Signed)
Patient Demographics:    [name], is a 72 y.o. female, DOB - Jul 26, 1945, [id number]  Admit date - 05/27/2017   Admitting Physician [name], MD  Outpatient Primary MD for the patient is [name], MD  LOS - 8   Chief Complaint  Patient presents with  . 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  Patient had been receiving her  chemotherapy infusion when she became lethargic and hypoxic and sent to emergency department for further evaluation.  Chest x-ray on admission was suggestive of exacerbation of CHF and she has been currently managed for that.  she decompensated required more oxygen and became more short of breath and tachypneic, patient and husband were initially agreeable to transfer to skilled nursing facility for rehab prior to returning home, however given respiratory and cardiovascular decompensation patient is now  agreeable to have further palliative care conversations about goals of care .   Plan:- 1)Acute on Chronic Hypercarbic/Hypoxic Respiratory Failure: Respiratory status worsened with minimal activity, patient is back on high flow oxygen (12 L/min) , was on 5 L by nasal cannula last 24 hours, likely contributed by CHF and COPD,  PTA patient was on 2.5 L of oxygen at home, continue prednisone 20 mg daily and  Lasix 40 mg IV daily. D/w Dr Julien Nordmann oncologist there are no good treatment options, patient is now agreeable to talk to palliative care services about goals of care  2)HFpEF-on 06/14/2017 patient respiratory status decompensated again, , Oxygen requirement is better as noted in #1 above,  patient with history of CHF with preserved ejection fraction, last known EF is 55-60%, continue IV Lasix 40 mg daily, prior to admission patient was on Lasix 20 mg p.o. Daily.  Daily weight and fluid input and output monitoring.  Patient does not have significant lower extremity edema, however she does have bilateral upper extremity edema due to SVC syndrome in the setting of lung malignancy  3)Acute COPD exacerbation:- Worse as  above #1, c/n prednisone to 20 mg daily, continue bronchodilators, requiring high flow oxygen  4)Stage IV non-small cell lung cancer: Follows with Dr. Julien Nordmann.  Was on chemotherapy.  Status post palliative radiotherapy.  Has history of SVC syndrome.  Became hypoxic during her infusion with Taxol and carboplatin on 05/27/17.  d/w Dr Julien Nordmann on 06/04/17 who believes that palliative care consult is a reasonable option given patient's intolerance to palliative chemo  5)Paroxysmal A. fib: Continue stroke prophylaxis/anticoagulation with Eliquis and Cardizem for rate control  6)Type 2 diabetes mellitus: Previously uncontrolled, Hemoglobin A1c in November 2018 was 10.6, worsening her hyperglycemia due to steroids, should improve with tapering steroids, improved with Lantus insulin to 12 units  nightly ,  diabetic educator recommendations noted.  Hold off on scheduled mealtime insulin at this time as patient's oral intake is erratic and she is having loose stools with risk for hypoglycemia  7)Hypertension:  Stable, c/n Losartan.  8)Depression: Mood is stable.  Continue Celexa  9)Diarrhea-improving, C. difficile negative, c/n  Imodium as needed  10)Leukopenia-suspect chemo-induced leukopenia, improving ( WBC is 3.8),  last chemoRx 05/27/17,   no fevers  11)Disposition-patient and husband were initially agreeable to transfer to skilled nursing facility for rehab prior to returning home, however given respiratory and cardiovascular decompensation patient is now agreeable to have further palliative care conversations about goals of care    DVT prophylaxis:Eliquis Code Status: Full Family Communication:  Plan of care discussed with husband   Disposition Plan: SNF Vs Hospice   Lab Results  Component Value Date   PLT 248 06/03/2017    Inpatient Medications  Scheduled Meds: . apixaban  5 mg Oral BID  . arformoterol  15 mcg Nebulization BID  . atorvastatin  20 mg Oral q1800  . citalopram  20 mg Oral Daily  . diltiazem  360 mg Oral Daily  . furosemide  40 mg Intravenous Daily  . guaiFENesin  1,200 mg Oral BID  . insulin aspart  0-5 Units Subcutaneous QHS  . insulin aspart  0-9 Units Subcutaneous TID WC  . insulin glargine  12 Units Subcutaneous QHS  . losartan  50 mg Oral Daily  . mouth rinse  15 mL Mouth Rinse BID  . nystatin  5 mL Oral TID  . predniSONE  20 mg Oral Q breakfast  . sodium chloride flush  3 mL Intravenous Q12H  . umeclidinium bromide  1 puff Inhalation Daily   Continuous Infusions: . sodium chloride 250 mL (06/03/17 0500)   PRN Meds:.sodium chloride, acetaminophen, ipratropium-albuterol, loperamide, ondansetron (ZOFRAN) IV, sodium chloride flush   Anti-infectives (From admission, onward)   None       Objective:   Vitals:   06/04/17 1210  06/04/17 1248 06/04/17 1405 06/04/17 2102  BP:   (!) 114/50   Pulse:   94   Resp:   18   Temp:   (!) 97.4 F (36.3 C)   TempSrc:   Oral   SpO2: 92% 90% 90% 90%  Weight:      Height:        Wt Readings from Last 3 Encounters:  06/04/17 53.8 kg (118 lb 9.7 oz)  05/27/17 57.2 kg (126 lb 1.6 oz)  05/10/17 58.2 kg (128 lb 3.2 oz)    Intake/Output Summary (Last 24 hours) at 06/04/2017 2118 Last data filed at 06/04/2017 1800 Gross per 24 hour  Intake 670 ml  Output 400 ml  Net 270 ml     Physical Exam  Gen:- Awake Alert, respiratory distress  HEENT:- [place].AT, No sclera icterus ,  12 L /min Neck-Supple Neck,No JVD,.  Lungs-  Diminished in bases with bibasilar rales CV- S1, S2 normal, irregular Abd-  +ve B.Sounds, Abd Soft, No tenderness,    Extremity/Skin:- improved Bil UE  edema, no leg edema (Patient does not  have significant lower extremity edema, however she does have bilateral upper extremity edema due to SVC syndrome in the setting of lung malignancy) Psych- affect is appropriate Neuro-generalized weakness without new focal deficits  Data Review:   Micro Results Recent Results (from the past 240 hour(s))  MRSA PCR Screening     Status: None   Collection Time: 05/29/17  8:40 AM  Result Value Ref Range Status   MRSA by PCR NEGATIVE NEGATIVE Final    Comment:        The GeneXpert MRSA Assay (FDA approved for NASAL specimens only), is one component of a comprehensive MRSA colonization surveillance program. It is not intended to diagnose MRSA infection nor to guide or monitor treatment for MRSA infections.   C difficile quick scan w PCR reflex     Status: None   Collection Time: 05/29/17 10:11 AM  Result Value Ref Range Status   C Diff antigen NEGATIVE NEGATIVE Final   C Diff toxin NEGATIVE NEGATIVE Final   C Diff interpretation No C. difficile detected.  Final    Comment: NEGATIVE    Radiology Reports Dg Chest Port 1 View  Result Date: 05/31/2017 CLINICAL  DATA:  Shortness of breath. EXAM: PORTABLE CHEST 1 VIEW COMPARISON:  Radiograph of May 27, 2017. FINDINGS: Stable cardiomediastinal silhouette no pneumothorax is noted. Stable bilateral pleural effusions are noted with probable associated atelectasis. Atherosclerosis of thoracic aorta is noted. Bony thorax is unremarkable. IMPRESSION: Aortic atherosclerosis. Stable bilateral pleural effusions with associated atelectasis. Electronically Signed   By: Marijo Conception, M.D.   On: 05/31/2017 07:11   Dg Chest Port 1 View  Result Date: 05/27/2017 CLINICAL DATA:  Dyspnea and lethargy EXAM: PORTABLE CHEST 1 VIEW COMPARISON:  04/24/2017 FINDINGS: Obscuration of the diaphragms consistent with small to moderate bilateral pleural effusions. Low lung volumes with vascular congestion. Aortic atherosclerosis at the arch without definite aneurysm. The heart is partially obscured by the pleural effusions. No acute osseous abnormality. ACDF of the lower cervical spine. IMPRESSION: Low lung volumes with vascular congestion and small to moderate bilateral pleural effusions consistent with stigmata of CHF. Electronically Signed   By: Ashley Royalty M.D.   On: 05/27/2017 22:35     CBC Recent Labs  Lab 05/31/17 0302 06/01/17 0315 06/02/17 0842 06/03/17 0931  WBC 1.0* 2.1* 2.9* 3.8*  HGB 10.8* 10.3* 9.8* 10.0*  HCT 34.1* 32.5* 30.9* 31.7*  PLT 238 238 232 248  MCV 102.7* 101.9* 102.7* 101.6*  MCH 32.5 32.3 32.6 32.1  MCHC 31.7 31.7 31.7 31.5  RDW 17.2* 16.7* 16.9* 16.6*  LYMPHSABS  --  0.1*  --  0.4*  MONOABS  --  0.1  --  0.4  EOSABS  --  0.1  --  0.3  BASOSABS  --  0.0  --  0.0    Chemistries  Recent Labs  Lab 05/30/17 0255 05/31/17 0302 06/01/17 0315 06/02/17 0842 06/03/17 0931  NA 138 136 135 133* 134*  K 4.2 3.9 4.1 3.7 3.2*  CL 93* 91* 89* 86* 86*  CO2 39* 39* 40* 40* 39*  GLUCOSE 157* 233* 115* 171* 132*  BUN 27* 30* 29* 24* 17  CREATININE <0.30* 0.37* 0.45 0.39* 0.32*  CALCIUM 8.4* 8.6*  8.3* 8.3* 8.2*   ------------------------------------------------------------------------------------------------------------------ No results for input(s): CHOL, HDL, LDLCALC, TRIG, CHOLHDL, LDLDIRECT in the last 72 hours.  Lab Results  Component Value Date   HGBA1C 10.6 (H) 04/11/2017   ------------------------------------------------------------------------------------------------------------------ No results for input(s): TSH, T4TOTAL, T3FREE, THYROIDAB  in the last 72 hours.  Invalid input(s): FREET3 ------------------------------------------------------------------------------------------------------------------ No results for input(s): VITAMINB12, FOLATE, FERRITIN, TIBC, IRON, RETICCTPCT in the last 72 hours.  Coagulation profile No results for input(s): INR, PROTIME in the last 168 hours.  No results for input(s): DDIMER in the last 72 hours.  Cardiac Enzymes No results for input(s): CKMB, TROPONINI, MYOGLOBIN in the last 168 hours.  Invalid input(s): CK ------------------------------------------------------------------------------------------------------------------    Component Value Date/Time   BNP 15.0 02/22/2017 2249   patient and husband were initially agreeable to transfer to skilled nursing facility for rehab prior to returning home, however given respiratory and cardiovascular decompensation patient is now agreeable to have further palliative care conversations about goals of care  Roxan Hockey M.D on 06/04/2017 at 9:18 PM  Between 7am to 7pm - Pager - [phone]  After 7pm go to [email or web address] - password [email or web address]  [facility] -  Office  [phone]   Voice Recognition Viviann Spare dictation system was used to create this note, attempts have been made to correct errors. Please contact the author with questions and/or clarifications.

## 2017-06-04 NOTE — Progress Notes (Signed)
Patient was getting PT and was sitting on edge of bed when her O2 sats dropped to 51% on 8L HFNC. Respiratory was called and pt was placed on 15L HFNC and pt sats only came up to 80% so was placed on Non-rebreather. O2 sats at this time came up to 90-91%. Patient had WOB and very dyspneic. MD notified and is aware. Will continue to monitor patient closely.

## 2017-06-04 NOTE — Progress Notes (Signed)
Occupational Therapy Evaluation Patient Details Name: [name] MRN: [id number] DOB: 27-Jan-1946 Today's Date: 06/04/2017    History of Present Illness [name] a 72 y.o.femalewith a past medical history of COPD,diet-controlled T2DM,right lung mass highly suspicious for stage IV non-small cell lung cancer who is currently receiving radiation treatment. 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 She presented to the cancer center for routine follow-up, complaining of severe fatigue and intermittently lightheaded.Her blood glucose level was checked and was noted to be greater than 600.; pt sent to ED and while being examined pt went t into afib with RVR   Clinical Impression   Evaluation limited due to respiratory status and WOB. PTA, pt lived at home with her husband and states her activity was limited to bed - bathroom due to fatigue and her "breathing". Once EOB, noted pt's O2 sats were 49 on 4L. Rechecked and pt's O2 Sats 51 with HR 114. Nsg called and respiratory paged. Pt on high flow [place] and O2 increased to 15L  With O2 sats  77. Respiratory placed pt on non-rebreather. At this time, pt is limited with her ability to complete mobility and self care. Pt will most likely need SNF for rehab. Recommend Palliative Care consult for discussing goals of care and supporting pt in her care.     Follow Up Recommendations  Supervision/Assistance - 24 hour;SNF  - pending progress   Equipment Recommendations  Other (comment)(TBA)    Recommendations for Other Services (Palliative Care Consult)     Precautions / Restrictions Precautions Precautions: Fall Precaution Comments: watch O2 Sats Restrictions Weight Bearing Restrictions: No      Mobility Bed Mobility Overal bed mobility: Needs Assistance Bed Mobility: Supine to Sit;Sit to Supine     Supine to sit: Supervision Sit to supine: Supervision      Transfers Overall  transfer level: Needs assistance               General transfer comment: "It took 2 people to get me to the chair yesterday". Not attmepted due to respiratory status    Balance                                           ADL either performed or assessed with clinical judgement   ADL Overall ADL's : Needs assistance/impaired                                       General ADL Comments: Pt ableto get to the EOB with min guard A. Once EOB, noted pt's O2 Sats 49 on 4L. Nursing called. O2 increased to 6 L; Pt on high flow [place] and per instructions form RT, increased O2 to 15L. Further mobility not attempted due to respiratory status and WOB. Pt states she was having a hard time caring for herself PTA due to her "breathing"; Pt able to assist duering session with bed mobility to place clean pad underneath her; skin breakdown noted on R buttocks - nsg made aware and cream applied in addition to Mepalex replaced.     Vision Baseline Vision/History: Wears glasses       Perception     Praxis      Pertinent Vitals/Pain Pain Assessment: No/denies pain  Hand Dominance Right   Extremity/Trunk Assessment Upper Extremity Assessment Upper Extremity Assessment: Generalized weakness   Lower Extremity Assessment Lower Extremity Assessment: Defer to PT evaluation   Cervical / Trunk Assessment Cervical / Trunk Assessment: Normal   Communication Communication Communication: No difficulties   Cognition   Behavior During Therapy: WFL for tasks assessed/performed Overall Cognitive Status: No family/caregiver present to determine baseline cognitive functioning                                     General Comments       Exercises     Shoulder Instructions      Home Living Family/patient expects to be discharged to:: Private residence Living Arrangements: Spouse/significant other Available Help at Discharge: Family Type of Home:  House Home Access: Ramped entrance     Home Layout: One level     Bathroom Shower/Tub: Teacher, early years/pre: Standard     Home Equipment: Wheelchair - [employer] - 2 wheels;Bedside commode;Shower seat          Prior Functioning/Environment Level of Independence: Needs assistance  Gait / Transfers Assistance Needed: using walker inside home. wheelchair outside of home.  ADL's / Homemaking Assistance Needed: sponge bathing prior to admission   Comments: unable to take a shower due to fatigue; only walking from bedroom to bathroom        OT Problem List: Decreased strength;Decreased activity tolerance;Decreased knowledge of use of DME or AE;Cardiopulmonary status limiting activity      OT Treatment/Interventions: Self-care/ADL training;Energy conservation;DME and/or AE instruction;Therapeutic activities;Patient/family education    OT Goals(Current goals can be found in the care plan section) Acute Rehab OT Goals Patient Stated Goal: to get better OT Goal Formulation: With patient Time For Goal Achievement: 06/18/17 Potential to Achieve Goals: Good  OT Frequency: Min 2X/week   Barriers to D/C:            Co-evaluation PT/OT/SLP Co-Evaluation/Treatment: Yes Reason for Co-Treatment: Complexity of the patient's impairments (multi-system involvement)   OT goals addressed during session: ADL's and self-care      AM-PAC PT "6 Clicks" Daily Activity     Outcome Measure Help from another person eating meals?: A Little Help from another person taking care of personal grooming?: A Lot Help from another person toileting, which includes using toliet, bedpan, or urinal?: A Lot Help from another person bathing (including washing, rinsing, drying)?: A Lot Help from another person to put on and taking off regular upper body clothing?: A Lot Help from another person to put on and taking off regular lower body clothing?: A Lot 6 Click Score: 13   End of Session  Equipment Utilized During Treatment: Oxygen Nurse Communication: Other (comment)(O2 Sats)  Activity Tolerance: Treatment limited secondary to medical complications (Comment)(respiratory status) Patient left: in bed;with bed alarm set;with call bell/phone within reach;with nursing/sitter in room  OT Visit Diagnosis: Muscle weakness (generalized) (M62.81)                Time: 1607-3710 OT Time Calculation (min): 20 min Charges:  OT General Charges $OT Visit: 1 Visit OT Evaluation $OT Eval Moderate Complexity: 1 Mod G-Codes:     Caria Transue, OT/L  [phone] 06/04/2017  Aarini Slee,HILLARY 06/04/2017, 1:06 PM

## 2017-06-05 DIAGNOSIS — R0602 Shortness of breath: Secondary | ICD-10-CM

## 2017-06-05 DIAGNOSIS — I5033 Acute on chronic diastolic (congestive) heart failure: Secondary | ICD-10-CM

## 2017-06-05 DIAGNOSIS — Z515 Encounter for palliative care: Secondary | ICD-10-CM

## 2017-06-05 DIAGNOSIS — Z7189 Other specified counseling: Secondary | ICD-10-CM

## 2017-06-05 DIAGNOSIS — C3411 Malignant neoplasm of upper lobe, right bronchus or lung: Secondary | ICD-10-CM

## 2017-06-05 DIAGNOSIS — I480 Paroxysmal atrial fibrillation: Secondary | ICD-10-CM

## 2017-06-05 DIAGNOSIS — I871 Compression of vein: Secondary | ICD-10-CM

## 2017-06-05 DIAGNOSIS — J9621 Acute and chronic respiratory failure with hypoxia: Secondary | ICD-10-CM

## 2017-06-05 DIAGNOSIS — J9622 Acute and chronic respiratory failure with hypercapnia: Principal | ICD-10-CM

## 2017-06-05 DIAGNOSIS — E119 Type 2 diabetes mellitus without complications: Secondary | ICD-10-CM

## 2017-06-05 LAB — BASIC METABOLIC PANEL
Anion gap: 6 (ref 5–15)
BUN: 14 mg/dL (ref 6–20)
CO2: 39 mmol/L — ABNORMAL HIGH (ref 22–32)
Calcium: 8.2 mg/dL — ABNORMAL LOW (ref 8.9–10.3)
Chloride: 89 mmol/L — ABNORMAL LOW (ref 101–111)
Creatinine, Ser: 0.33 mg/dL — ABNORMAL LOW (ref 0.44–1.00)
GFR calc Af Amer: >60 mL/min (ref >60)
GFR calc non Af Amer: >60 mL/min (ref >60)
Glucose, Bld: 102 mg/dL — ABNORMAL HIGH (ref 65–99)
Potassium: 3.1 mmol/L — ABNORMAL LOW (ref 3.5–5.1)
Sodium: 134 mmol/L — ABNORMAL LOW (ref 135–145)

## 2017-06-05 LAB — CBC
HCT: 30.1 % — ABNORMAL LOW (ref 36.0–46.0)
HEMOGLOBIN: 9.7 g/dL — AB (ref 12.0–15.0)
MCH: 32.9 pg (ref 26.0–34.0)
MCHC: 32.2 g/dL (ref 30.0–36.0)
MCV: 102 fL — ABNORMAL HIGH (ref 78.0–100.0)
Platelets: 232 10*3/uL (ref 150–400)
RBC: 2.95 MIL/uL — ABNORMAL LOW (ref 3.87–5.11)
RDW: 17 % — AB (ref 11.5–15.5)
WBC: 6.4 10*3/uL (ref 4.0–10.5)

## 2017-06-05 LAB — MAGNESIUM: MAGNESIUM: 1.8 mg/dL (ref 1.7–2.4)

## 2017-06-05 LAB — GLUCOSE, CAPILLARY
Glucose-Capillary: 114 mg/dL — ABNORMAL HIGH (ref 65–99)
Glucose-Capillary: 389 mg/dL — ABNORMAL HIGH (ref 65–99)
Glucose-Capillary: 216 mg/dL — ABNORMAL HIGH (ref 65–99)
Glucose-Capillary: 253 mg/dL — ABNORMAL HIGH (ref 65–99)

## 2017-06-05 MED ORDER — FUROSEMIDE 10 MG/ML IJ SOLN
40.0000 mg | Freq: Two times a day (BID) | INTRAMUSCULAR | Status: DC
  Administered 2017-06-05 – 2017-06-06 (×3): 40 mg via INTRAVENOUS
  Filled 2017-06-05 (×2): qty 4

## 2017-06-05 MED ORDER — POTASSIUM CHLORIDE CRYS ER 20 MEQ PO TBCR
40.0000 meq | EXTENDED_RELEASE_TABLET | ORAL | Status: AC
Start: 2017-06-05 — End: 2017-06-05
  Administered 2017-06-05 (×2): 40 meq via ORAL
  Filled 2017-06-05 (×2): qty 2

## 2017-06-05 MED ORDER — IPRATROPIUM-ALBUTEROL 0.5-2.5 (3) MG/3ML IN SOLN
3.0000 mL | Freq: Four times a day (QID) | RESPIRATORY_TRACT | Status: DC
  Administered 2017-06-05 (×3): 3 mL via RESPIRATORY_TRACT
  Filled 2017-06-05 (×2): qty 3

## 2017-06-05 MED ORDER — INSULIN ASPART 100 UNIT/ML ~~LOC~~ SOLN
3.0000 [IU] | Freq: Three times a day (TID) | SUBCUTANEOUS | Status: DC
  Administered 2017-06-06 – 2017-06-07 (×5): 3 [IU] via SUBCUTANEOUS

## 2017-06-05 MED ORDER — IPRATROPIUM-ALBUTEROL 0.5-2.5 (3) MG/3ML IN SOLN
3.0000 mL | RESPIRATORY_TRACT | Status: DC
  Administered 2017-06-05 – 2017-06-06 (×5): 3 mL via RESPIRATORY_TRACT
  Filled 2017-06-05 (×5): qty 3

## 2017-06-05 MED ORDER — BUDESONIDE 0.5 MG/2ML IN SUSP
0.5000 mg | Freq: Two times a day (BID) | RESPIRATORY_TRACT | Status: DC
  Administered 2017-06-05 – 2017-06-07 (×6): 0.5 mg via RESPIRATORY_TRACT
  Filled 2017-06-05 (×6): qty 2

## 2017-06-05 NOTE — Consult Note (Signed)
Consultation Note Date: 06/05/2017   Patient Name: [name]  DOB: 10/11/1945  MRN: [id number]  Age / Sex: 72 y.o., female  PCP: [name], MD Referring Physician: Eugenie Filler, MD  Reason for Consultation: Establishing goals of care  HPI/Patient Profile: 72 y.o. female  with past medical history of stage IV NSCLC (SVC syndrome), COPD, diastolic heart failure, HTN, HLD admitted on 05/27/2017 with lethargy and respiratory distress initially requiring BiPAP. Continued struggle with respiratory status d/t cancer, COPD, and heart failure. Palliative care requested to assist with [place] conversations.   Clinical Assessment and Goals of Care: I met today with [name]. There is no family or visitors at bedside. [name] shares with me that she is interested in exploring her hospice options. She cared for her father who died of cancer and had hospice services and had a good experience. She is interested in going home with hospice but concerned about the burden this would be for her husband and family. We explored home with hospice, home with hospice with plans to transition to hospice facility, and also hospice facility. She feels comfortable with having hospice involved and says she has been telling her daughter for months that she is "tired." She asked many good questions. I explained that we will see how she does overnight and her oxygen needs and help her make a good decision for herself and her family.   She also shares with me that she already has Advance Directives and that she does not desire life support or feeding tube or life prolonging measures - DNR placed per patient wishes. She does not feel like she currently needs morphine to assist with her breathing but we discussed this option when she feels she would benefit.   She has peace with her decline and being at EOL. She shares that her daughter and  grandchildren are alright. She shares that her husband will be alright but he took it pretty hard today. She finds joy and strength through her family and her faith.   Primary Decision Maker Patient    SUMMARY OF RECOMMENDATIONS   - DNR - Pursuing hospice options - Focus on comfort care  Code Status/Advance Care Planning:  DNR   Symptom Management:   SOB: Will benefit from low dose morphine for relief and comfort. Would begin with roxanol 5 mg SL as needed.   Palliative Prophylaxis:   Delirium Protocol and Frequent Pain Assessment  Additional Recommendations (Limitations, Scope, Preferences):  No Artificial Feeding  Psycho-social/Spiritual:   Desire for further Chaplaincy support:yes  Additional Recommendations: Education on Hospice and Grief/Bereavement Support  Prognosis:   TBD. Likely eligible for hospice facility if desired.   Discharge Planning: To Be Determined. Will discuss further - hospice at home vs hospice facility.      Primary Diagnoses: Present on Admission: . Malignant neoplasm of bronchus of right upper lobe ([place]) . PAF (paroxysmal atrial fibrillation) ([place]) . SVC syndrome . Acute on chronic respiratory failure with hypoxia and hypercapnia ([facility]) . Acute on chronic  diastolic CHF (congestive heart failure) ([place])   I have reviewed the medical record, interviewed the patient and family, and examined the patient. The following aspects are pertinent.  Past Medical History:  Diagnosis Date  . Arthritis   . Asthma   . Cancer ([facility] [facility])   . COPD (chronic obstructive pulmonary disease) ([facility])   . Depression   . Diabetes mellitus    diet controlled  . History of hiatal hernia   . Hyperlipemia   . Hypertension   . Pneumonia    Social History   Socioeconomic History  . Marital status: Married    Spouse name: None  . Number of children: None  . Years of education: None  . Highest education level: None  Social Needs  . Financial resource strain:  None  . Food insecurity - worry: None  . Food insecurity - inability: None  . Transportation needs - medical: None  . Transportation needs - non-medical: None  Occupational History  . None  Tobacco Use  . Smoking status: Former Smoker    Packs/day: 1.00    Years: 53.00    Pack years: 53.00    Start date: 12/22/1962    Last attempt to quit: 02/25/2017    Years since quitting: 0.2  . Smokeless tobacco: Never Used  . Tobacco comment: Peak rate 1.5ppd - quit at most 6 months  Substance and Sexual Activity  . Alcohol use: No  . Drug use: No  . Sexual activity: Not Currently  Other Topics Concern  . None  Social History Narrative   [facility] (02/23/17):   Originally from [place]. Previously has lived in [place] as well as [place]. Previously worked with a Engineer, structural and in Engineer, materials. Remote exposure to a parrot. Currently lives with her husband. Retired.   Family History  Problem Relation Age of Onset  . Diabetes Mother   . Colon cancer Father   . Cerebral palsy Brother   . Breast cancer Paternal Aunt   . Breast cancer Cousin   . Prostate cancer Paternal Uncle   . Bone cancer Maternal Aunt    Scheduled Meds: . apixaban  5 mg Oral BID  . arformoterol  15 mcg Nebulization BID  . atorvastatin  20 mg Oral q1800  . budesonide (PULMICORT) nebulizer solution  0.5 mg Nebulization BID  . citalopram  20 mg Oral Daily  . diltiazem  360 mg Oral Daily  . furosemide  40 mg Intravenous Q12H  . guaiFENesin  1,200 mg Oral BID  . insulin aspart  0-5 Units Subcutaneous QHS  . insulin aspart  0-9 Units Subcutaneous TID WC  . insulin glargine  12 Units Subcutaneous QHS  . ipratropium-albuterol  3 mL Nebulization Q6H  . losartan  50 mg Oral Daily  . mouth rinse  15 mL Mouth Rinse BID  . nystatin  5 mL Oral TID  . predniSONE  20 mg Oral Q breakfast  . sodium chloride flush  3 mL Intravenous Q12H   Continuous Infusions: . sodium chloride 250 mL (06/03/17 0500)    PRN Meds:.sodium chloride, acetaminophen, ipratropium-albuterol, loperamide, ondansetron (ZOFRAN) IV, sodium chloride flush Allergies  Allergen Reactions  . Penicillins Hives    Has patient had a PCN reaction causing immediate rash, facial/tongue/throat swelling, SOB or lightheadedness with hypotension: yes Has patient had a PCN reaction causing severe rash involving mucus membranes or skin necrosis: no Has patient had a PCN reaction that required hospitalization: yes Has patient had a PCN reaction occurring within  the last 10 years: no If all of the above answers are "NO", then may proceed with Cephalosporin use.   . Tiotropium Bromide Monohydrate Other (See Comments)    EYE PAIN KIDNEY FUNCTION SLOWED DOWN   Review of Systems  Constitutional: Positive for activity change, appetite change and fatigue.  Respiratory: Positive for shortness of breath.   Neurological: Positive for weakness.    Physical Exam  Constitutional: She is oriented to person, place, and time. She appears well-developed. She has a sickly appearance.  HENT:  Head: Normocephalic and atraumatic.  Cardiovascular: Normal rate.  Pulmonary/Chest: No accessory muscle usage. No tachypnea. She is in respiratory distress.  Increased WOB even with brief conversation  Abdominal: Normal appearance.  Neurological: She is alert and oriented to person, place, and time.  Nursing note and vitals reviewed.   Vital Signs: BP (!) 106/48 (BP Location: Left Leg)   Pulse (!) 102   Temp (!) 97.5 F (36.4 C) (Oral)   Resp 20   Ht 5' (1.524 m)   Wt 54.9 kg (121 lb 0.5 oz)   SpO2 90%   BMI 23.64 kg/m  Pain Assessment: No/denies pain   Pain Score: 0-No pain   SpO2: SpO2: 90 % O2 Device:SpO2: 90 % O2 Flow Rate: .O2 Flow Rate (L/min): 12 L/min  IO: Intake/output summary:   Intake/Output Summary (Last 24 hours) at 06/05/2017 1622 Last data filed at 06/05/2017 1423 Gross per 24 hour  Intake 760 ml  Output -  Net 760 ml     LBM: Last BM Date: 06/03/17 Baseline Weight: Weight: 57.2 kg (126 lb) Most recent weight: Weight: 54.9 kg (121 lb 0.5 oz)     Palliative Assessment/Data: 30%    Time Total: 50 min  Greater than 50%  of this time was spent counseling and coordinating care related to the above assessment and plan.  Signed by: Vinie Sill, NP Palliative Medicine Team Pager # [phone] (M-F 8a-5p) Team Phone # [phone] (Nights/Weekends)

## 2017-06-05 NOTE — Progress Notes (Signed)
   06/05/17 1600  Clinical Encounter Type  Visited With Patient;Health care provider  Visit Type Initial  Spiritual Encounters  Spiritual Needs Prayer;Emotional   Rounding on Palliative Patients.  Patient alone sitting up, welcomed visit.  Shared being in and out of the hospital especially over the last year.  Spoke of family and their son dying 12 years ago in an accident.  She shared about her cancer.  When asked about leaving the hospital she indicated she would probably go under [facility] and affirmed she felt comfortable with that decision saying she was tired.  We prayed together and I spoke with Palliative Staff following the visit.  Will follow as needed. Chaplain Katherene Ponto

## 2017-06-05 NOTE — Progress Notes (Signed)
PROGRESS NOTE    [name]  PPI:[id number] DOB: 1945-12-28 DOA: 05/27/2017 PCP: [name], MD    Brief Narrative:  Patient is a 72 year old female with past medical history of COPD, chronic diastolic CHF, chronic hypoxic respiratory failure, insulin-dependent diabetes, depression, paroxysmal A. 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 Patient had been receiving her chemotherapy infusion when she became lethargic and hypoxic and sent to emergency department for further evaluation. Chest x-ray on admission was suggestive of exacerbation of CHF and she has been currently managed for that.  she decompensated required more oxygen and became more short of breath and tachypneic, patient and husband were initially agreeable to transfer to skilled nursing facility for rehab prior to returning home, however given respiratory and cardiovascular decompensation patient is now agreeable to have further palliative care conversations about goals of care .     Assessment & Plan:   Principal Problem:   Acute on chronic diastolic CHF (congestive heart failure) (HCC) Active Problems:   Type 2 diabetes mellitus (HCC)   Acute on chronic respiratory failure with hypoxia and hypercapnia (HCC)   SVC syndrome   Malignant neoplasm of bronchus of right upper lobe (HCC)   PAF (paroxysmal atrial fibrillation) (HCC)  1)Acute on Chronic Hypercarbic/Hypoxic Respiratory Failure: Respiratory status worsened with minimal activity over the past 48 hours, patient is back on high flow oxygen (15 L/min) , was on 5 L by nasal cannula previously, likely contributed by CHF and COPD,  PTA patient was on 2.5 L of oxygen at home, continue prednisone 20 mg daily and  Lasix 40 mg IV daily. Dr Rosalva Ferron d/w Dr Julien Nordmann oncologist there are no good treatment options, patient is now agreeable to talk to  palliative care services about goals of care.  Palliative care consultation pending.  2)HFpEF-on 06/14/2017 patient respiratory status decompensated again, , Oxygen requirement is better as noted in #1 above,  patient with history of CHF with preserved ejection fraction, last known EF is 55-60%, and on IV Lasix.  Urine output not properly recorded.  Patient currently -2.653 L during his hospitalization.  Current weight is 121 pounds.  Continue daily weights and strict I's and O's.  Patient does not have significant lower extremity edema, however she does have bilateral upper extremity edema due to SVC syndrome in the setting of lung malignancy  3)Acute COPD exacerbation:- Worse as  above #1, patient with significant congestion.  Continue current regimen of bronchodilators, steroid taper, Brovana.  Will add Pulmicort and scheduled duo nebs.  Chest PT.  Supportive care.   4)Stage IV non-small cell lung cancer:Follows with Dr. Julien Nordmann.Was on chemotherapy.Status post palliative radiotherapy.Has history of SVC syndrome. Became hypoxic during her infusion with Taxol and carboplatin on 05/27/17.Dr Malena Edman Dr Julien Nordmann on 06/04/17 who believes that palliative care consult is a reasonable option given patient's intolerance to palliative chemo.  Palliative care consultation pending.  5)Paroxysmal A. fib:   Cardizem for rate control. Eliquis for anticoagulation.  6)Type 2 diabetes mellitus: Previously uncontrolled,Hemoglobin A1c in November 2018 was 10.6, worsening her hyperglycemia due to steroids, should improve with steroid taper.  These are ranging from 114-389.  Continue current regimen of Lantus 12 units daily.  Sliding scale insulin.  Will place on meal coverage insulin NovoLog 3 units 3 times daily with meals and titrate accordingly.   7)Hypertension:  Stable on current regimen.  Follow.  8)Depression:  Stable.  No suicidal or homicidal ideations.  Continue current regimen of Celexa.    9)Diarrhea-clinical improvement.  C. difficile PCR was negative.  Continue Imodium as needed.  10)Leukopenia-likely chemo induced.  WBC trending up and currently at 6.4.  Patient's last  chemoRx 05/27/17.  11)Disposition-patient and husband were initially agreeable to transfer to skilled nursing facility for rehab prior to returning home, however given respiratory and cardiovascular decompensation patient is now agreeable to have further palliative care conversations about goals of care.  Patient to meet with palliative care later on today 06/05/2017.      DVT prophylaxis: SCDs Code Status: DNR Family Communication: The patient and family at bedside. Disposition Plan: To be determined   Consultants:   Palliative care: Vinie Sill NP 02/03/8337  [place]urb sided Dr. Curt Bears hematology oncology  Procedures:   Chest x-ray 05/27/2017, 05/31/2017  2D echo 05/28/2017    Antimicrobials:   None   Subjective: Sitting up in bed complaining of congestion in her chest.  Patient noted to be hypoxic.  Patient states no significant change with shortness of breath over the past 48 hours.  Objective: Vitals:   06/04/17 2126 06/05/17 0641 06/05/17 0926 06/05/17 1305  BP: (!) 125/55 (!) 117/56  (!) 106/48  Pulse: 92 88  (!) 102  Resp: 18 20  20   Temp: 97.9 F (36.6 C) 97.7 F (36.5 C)  (!) 97.5 F (36.4 C)  TempSrc: Oral Oral  Oral  SpO2: (!) 88% 95% 93% (!) 87%  Weight:  54.9 kg (121 lb 0.5 oz)    Height:        Intake/Output Summary (Last 24 hours) at 06/05/2017 1320 Last data filed at 06/05/2017 1300 Gross per 24 hour  Intake 950 ml  Output 400 ml  Net 550 ml   Filed Weights   06/03/17 0400 06/04/17 0634 06/05/17 0641  Weight: 51.2 kg (112 lb 14 oz) 53.8 kg (118 lb 9.7 oz) 54.9 kg (121 lb 0.5 oz)    Examination:  General exam: Coughing.  Respiratory system: Some coarse breath sounds.  Some scattered rhonchi.  Respiratory effort normal. Cardiovascular system: S1 &  S2 heard, RRR. No JVD, murmurs, rubs, gallops or clicks. No pedal edema. Gastrointestinal system: Abdomen is nondistended, soft and nontender. No organomegaly or masses felt. Normal bowel sounds heard. Central nervous system: Alert and oriented. No focal neurological deficits. Extremities: Symmetric 5 x 5 power. Skin: No rashes, lesions or ulcers Psychiatry: Judgement and insight appear normal. Mood & affect appropriate.     Data Reviewed: I have personally reviewed following labs and imaging studies  CBC: Recent Labs  Lab 05/31/17 0302 06/01/17 0315 06/02/17 0842 06/03/17 0931 06/05/17 0445  WBC 1.0* 2.1* 2.9* 3.8* 6.4  NEUTROABS  --  1.8  --  2.8  --   HGB 10.8* 10.3* 9.8* 10.0* 9.7*  HCT 34.1* 32.5* 30.9* 31.7* 30.1*  MCV 102.7* 101.9* 102.7* 101.6* 102.0*  PLT 238 238 232 248 250   Basic Metabolic Panel: Recent Labs  Lab 05/31/17 0302 06/01/17 0315 06/02/17 0842 06/03/17 0931 06/05/17 0445  NA 136 135 133* 134* 134*  K 3.9 4.1 3.7 3.2* 3.1*  CL 91* 89* 86* 86* 89*  CO2 39* 40* 40* 39* 39*  GLUCOSE 233* 115* 171* 132* 102*  BUN 30* 29* 24* 17 14  CREATININE 0.37* 0.45 0.39* 0.32* 0.33*  CALCIUM 8.6* 8.3* 8.3* 8.2* 8.2*  MG  --   --   --   --  1.8  GFR: Estimated Creatinine Clearance: 51.7 mL/min (A) (by C-G formula based on SCr of 0.33 mg/dL (L)). Liver Function Tests: No results for input(s): AST, ALT, ALKPHOS, BILITOT, PROT, ALBUMIN in the last 168 hours. No results for input(s): LIPASE, AMYLASE in the last 168 hours. No results for input(s): AMMONIA in the last 168 hours. Coagulation Profile: No results for input(s): INR, PROTIME in the last 168 hours. Cardiac Enzymes: No results for input(s): CKTOTAL, CKMB, CKMBINDEX, TROPONINI in the last 168 hours. BNP (last 3 results) No results for input(s): PROBNP in the last 8760 hours. HbA1C: No results for input(s): HGBA1C in the last 72 hours. CBG: Recent Labs  Lab 06/04/17 1158 06/04/17 1646  06/04/17 2124 06/05/17 0809 06/05/17 1136  GLUCAP 259* 257* 200* 114* 389*   Lipid Profile: No results for input(s): CHOL, HDL, LDLCALC, TRIG, CHOLHDL, LDLDIRECT in the last 72 hours. Thyroid Function Tests: No results for input(s): TSH, T4TOTAL, FREET4, T3FREE, THYROIDAB in the last 72 hours. Anemia Panel: No results for input(s): VITAMINB12, FOLATE, FERRITIN, TIBC, IRON, RETICCTPCT in the last 72 hours. Sepsis Labs: No results for input(s): PROCALCITON, LATICACIDVEN in the last 168 hours.  Recent Results (from the past 240 hour(s))  MRSA PCR Screening     Status: None   Collection Time: 05/29/17  8:40 AM  Result Value Ref Range Status   MRSA by PCR NEGATIVE NEGATIVE Final    Comment:        The GeneXpert MRSA Assay (FDA approved for NASAL specimens only), is one component of a comprehensive MRSA colonization surveillance program. It is not intended to diagnose MRSA infection nor to guide or monitor treatment for MRSA infections.   C difficile quick scan w PCR reflex     Status: None   Collection Time: 05/29/17 10:11 AM  Result Value Ref Range Status   C Diff antigen NEGATIVE NEGATIVE Final   C Diff toxin NEGATIVE NEGATIVE Final   C Diff interpretation No C. difficile detected.  Final    Comment: NEGATIVE         Radiology Studies: No results found.      Scheduled Meds: . apixaban  5 mg Oral BID  . arformoterol  15 mcg Nebulization BID  . atorvastatin  20 mg Oral q1800  . budesonide (PULMICORT) nebulizer solution  0.5 mg Nebulization BID  . citalopram  20 mg Oral Daily  . diltiazem  360 mg Oral Daily  . furosemide  40 mg Intravenous Q12H  . guaiFENesin  1,200 mg Oral BID  . insulin aspart  0-5 Units Subcutaneous QHS  . insulin aspart  0-9 Units Subcutaneous TID WC  . insulin glargine  12 Units Subcutaneous QHS  . ipratropium-albuterol  3 mL Nebulization Q6H  . losartan  50 mg Oral Daily  . mouth rinse  15 mL Mouth Rinse BID  . nystatin  5 mL Oral TID   . predniSONE  20 mg Oral Q breakfast  . sodium chloride flush  3 mL Intravenous Q12H   Continuous Infusions: . sodium chloride 250 mL (06/03/17 0500)     LOS: 9 days    Time spent: 40 mins    Irine Seal, MD [facility] Pager [phone]  If 7PM-7AM, please contact night-coverage [email or web address] Password [email or web address] 06/05/2017, 1:20 PM

## 2017-06-05 NOTE — Progress Notes (Signed)
Inpatient Diabetes Program Recommendations  AACE/ADA: New Consensus Statement on Inpatient Glycemic Control (2015)  Target Ranges:  Prepandial:   less than 140 mg/dL      Peak postprandial:   less than 180 mg/dL (1-2 hours)      Critically ill patients:  140 - 180 mg/dL   Lab Results  Component Value Date   GLUCAP 389 (H) 06/05/2017   HGBA1C 10.6 (H) 04/11/2017    Review of Glycemic Control  Post-prandial blood sugars elevated.  Inpatient Diabetes Program Recommendations:     Add Novolog 3 units tidwc for meal coverage insulin.  Thank you. Lorenda Peck, RD, LDN, CDE Inpatient Diabetes Coordinator [phone]

## 2017-06-06 LAB — CBC
HCT: 29.8 % — ABNORMAL LOW (ref 36.0–46.0)
Hemoglobin: 9.6 g/dL — ABNORMAL LOW (ref 12.0–15.0)
MCH: 33.3 pg (ref 26.0–34.0)
MCHC: 32.2 g/dL (ref 30.0–36.0)
MCV: 103.5 fL — ABNORMAL HIGH (ref 78.0–100.0)
PLATELETS: 247 10*3/uL (ref 150–400)
RBC: 2.88 MIL/uL — ABNORMAL LOW (ref 3.87–5.11)
RDW: 17.4 % — AB (ref 11.5–15.5)
WBC: 6.3 10*3/uL (ref 4.0–10.5)

## 2017-06-06 LAB — BASIC METABOLIC PANEL
Anion gap: 8 (ref 5–15)
BUN: 14 mg/dL (ref 6–20)
CALCIUM: 8.3 mg/dL — AB (ref 8.9–10.3)
CO2: 37 mmol/L — ABNORMAL HIGH (ref 22–32)
CREATININE: 0.32 mg/dL — AB (ref 0.44–1.00)
Chloride: 93 mmol/L — ABNORMAL LOW (ref 101–111)
GFR calc non Af Amer: >60 mL/min (ref >60)
Glucose, Bld: 151 mg/dL — ABNORMAL HIGH (ref 65–99)
Potassium: 3.9 mmol/L (ref 3.5–5.1)
SODIUM: 138 mmol/L (ref 135–145)

## 2017-06-06 LAB — GLUCOSE, CAPILLARY
GLUCOSE-CAPILLARY: 206 mg/dL — AB (ref 65–99)
GLUCOSE-CAPILLARY: 87 mg/dL (ref 65–99)
Glucose-Capillary: 126 mg/dL — ABNORMAL HIGH (ref 65–99)
Glucose-Capillary: 243 mg/dL — ABNORMAL HIGH (ref 65–99)

## 2017-06-06 MED ORDER — FUROSEMIDE 10 MG/ML IJ SOLN
60.0000 mg | Freq: Two times a day (BID) | INTRAMUSCULAR | Status: DC
  Administered 2017-06-06 – 2017-06-07 (×2): 60 mg via INTRAVENOUS
  Filled 2017-06-06 (×2): qty 6

## 2017-06-06 MED ORDER — IPRATROPIUM-ALBUTEROL 0.5-2.5 (3) MG/3ML IN SOLN
3.0000 mL | Freq: Four times a day (QID) | RESPIRATORY_TRACT | Status: DC
  Administered 2017-06-07 (×4): 3 mL via RESPIRATORY_TRACT
  Filled 2017-06-06 (×4): qty 3

## 2017-06-06 MED ORDER — SODIUM CHLORIDE 0.9 % IV BOLUS (SEPSIS)
250.0000 mL | Freq: Once | INTRAVENOUS | Status: AC
  Administered 2017-06-06: 250 mL via INTRAVENOUS

## 2017-06-06 MED ORDER — METOPROLOL TARTRATE 5 MG/5ML IV SOLN
2.5000 mg | Freq: Once | INTRAVENOUS | Status: AC
  Administered 2017-06-06: 2.5 mg via INTRAVENOUS
  Filled 2017-06-06: qty 5

## 2017-06-06 MED ORDER — MORPHINE SULFATE (CONCENTRATE) 10 MG/0.5ML PO SOLN: 5.0000 mg | ORAL | Status: DC | PRN

## 2017-06-06 MED ORDER — FUROSEMIDE 10 MG/ML IJ SOLN
40.0000 mg | Freq: Once | INTRAMUSCULAR | Status: AC
  Administered 2017-06-06: 40 mg via INTRAVENOUS
  Filled 2017-06-06: qty 4

## 2017-06-06 NOTE — Progress Notes (Signed)
OT Cancellation Note  Patient Details Name: [name] MRN: [id number] DOB: Apr 11, 1946   Cancelled Treatment:    Reason Eval/Treat Not Completed: Medical issues which prohibited therapy Note the palliative progress note today regarding OOB activity. Will defer OOB today.  Jae Dire Kimbery Harwood 06/06/2017, 1:54 PM

## 2017-06-06 NOTE — Progress Notes (Signed)
PT demonstrated hands on understanding of Flutter device. 

## 2017-06-06 NOTE — Progress Notes (Signed)
PT Cancellation Note  Patient Details Name: [name] MRN: [id number] DOB: 06-30-1945   Cancelled Treatment:     unable to tolerate at this time.  Pt has been evaluated with rec for SNF.    Rica Koyanagi  PTA [facility]  Acute  Rehab Pager      [phone]

## 2017-06-06 NOTE — Progress Notes (Signed)
Daily Progress Note   Patient Name: [name]       Date: 06/06/2017 DOB: Apr 06, 1946  Age: 72 y.o. MRN#: [id number] Attending Physician: Eugenie Filler, MD Primary Care Physician: [name], MD Admit Date: 05/27/2017  Reason for Consultation/Follow-up: Establishing goals of care  Subjective: [name] says her breathing is maybe a little better today (WOB the same but improved recovery time). Cannot tolerate out of bed at this point.   Length of Stay: 10  Current Medications: Scheduled Meds:  . apixaban  5 mg Oral BID  . arformoterol  15 mcg Nebulization BID  . atorvastatin  20 mg Oral q1800  . budesonide (PULMICORT) nebulizer solution  0.5 mg Nebulization BID  . citalopram  20 mg Oral Daily  . diltiazem  360 mg Oral Daily  . furosemide  40 mg Intravenous Q12H  . guaiFENesin  1,200 mg Oral BID  . insulin aspart  0-5 Units Subcutaneous QHS  . insulin aspart  0-9 Units Subcutaneous TID WC  . insulin aspart  3 Units Subcutaneous TID WC  . insulin glargine  12 Units Subcutaneous QHS  . ipratropium-albuterol  3 mL Nebulization Q4H  . losartan  50 mg Oral Daily  . mouth rinse  15 mL Mouth Rinse BID  . nystatin  5 mL Oral TID  . predniSONE  20 mg Oral Q breakfast  . sodium chloride flush  3 mL Intravenous Q12H    Continuous Infusions: . sodium chloride 250 mL (06/03/17 0500)    PRN Meds: sodium chloride, acetaminophen, ipratropium-albuterol, loperamide, ondansetron (ZOFRAN) IV, sodium chloride flush  Physical Exam         Constitutional: She is oriented to person, place, and time. She appears well-developed. She has a sickly appearance.  HENT:  Head: Normocephalic and atraumatic.  Cardiovascular: Normal rate.  Pulmonary/Chest: No accessory muscle usage. No  tachypnea. She is in respiratory distress.  Increased WOB even with brief conversation  Abdominal: Normal appearance.  Neurological: She is alert and oriented to person, place, and time.  Nursing note and vitals reviewed.   Vital Signs: BP 114/66 (BP Location: Left Leg)   Pulse 100   Temp 97.6 F (36.4 C) (Axillary)   Resp 20   Ht 5' (1.524 m)   Wt 53.5 kg (117 lb 15.1 oz)  SpO2 94%   BMI 23.03 kg/m  SpO2: SpO2: 94 % O2 Device: O2 Device: High Flow Nasal Cannula O2 Flow Rate: O2 Flow Rate (L/min): 12 L/min  Intake/output summary:   Intake/Output Summary (Last 24 hours) at 06/06/2017 0901 Last data filed at 06/06/2017 0600 Gross per 24 hour  Intake 1080 ml  Output -  Net 1080 ml   LBM: Last BM Date: 06/03/17 Baseline Weight: Weight: 57.2 kg (126 lb) Most recent weight: Weight: 53.5 kg (117 lb 15.1 oz)       Palliative Assessment/Data: 30%      Patient Active Problem List   Diagnosis Date Noted  . Palliative care encounter   . SOB (shortness of breath)   . Acute on chronic diastolic CHF (congestive heart failure) ([place]) 05/27/2017  . Encounter for antineoplastic chemotherapy 04/23/2017  . Acute kidney injury ([place])   . Dehydration   . PAF (paroxysmal atrial fibrillation) ([place]) 04/11/2017  . Atrial fibrillation with RVR ([place]) 04/11/2017  . Hyperglycemia 04/11/2017  . Malignant neoplasm of bronchus of right upper lobe ([place]) 04/08/2017  . Oral thrush 04/08/2017  . 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 Throat swelling 03/13/2017  . Blurry vision, left eye 03/13/2017  . 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 Acute on chronic respiratory failure with hypoxia and hypercapnia ([place]) 03/08/2017  . Mass of right lung 03/07/2017  . Postobstructive pneumonia 02/23/2017  . Tobacco use disorder 02/23/2017  . Chronic obstructive pulmonary disease ([place]) 02/23/2017  . Hyperlipemia 02/23/2017  . Essential hypertension 02/23/2017  . Depression  02/23/2017  . Type 2 diabetes mellitus ([place]) 02/23/2017  . Lung mass 02/23/2017    Palliative Care Assessment & Plan   HPI: 72 y.o. female  with past medical history of stage IV NSCLC (SVC syndrome), COPD, diastolic heart failure, HTN, HLD admitted on 05/27/2017 with lethargy and respiratory distress initially requiring BiPAP. Continued struggle with respiratory status d/t cancer, COPD, and heart failure. Palliative care requested to assist with [place] conversations.   Assessment: I met again today with [name] and her husband at bedside. [name] says she has been thinking and would like to pursue hospice facility in [facility] (family all live in [place]). She is unable to get out of bed, becomes SOB with just conversation, and is worried about her ability to be comfortable at home and the burden on her family. We spoke more about trial of morphine - she is a little resistant and scared of this so I tried to reassure her that this could help her feel much better and especially help with recovery times when she has to urinate or have bowel movement, bathing, and activity. I explained that I will make it available to her and she only has to ask for it if she would like to try.   She is still on 12L oxygen and continues to be SOB. Continues on IV Lasix as well. She is alert and oriented and is eating a little better on prednisone. I fear that her tenuous respiratory status could cause decline quickly and suddenly which would make her appropriate for hospice facility. She also has great need for symptom management but is a little resistance to medications for comfort at this time.   Recommendations/Plan:  SOB: Added roxanol 5 mg every 2 hours prn.    Code Status:  DNR  Prognosis:   < 4 weeks is very likely with tenuous respiratory status (and possibly less)  Discharge  Planning:  Hospice facility   Thank you for allowing the Palliative Medicine Team to assist in the care of this  patient.   Total Time 30 min Prolonged Time Billed  no       Greater than 50%  of this time was spent counseling and coordinating care related to the above assessment and plan.  Vinie Sill, NP Palliative Medicine Team Pager # [phone] (M-F 8a-5p) Team Phone # [phone] (Nights/Weekends)

## 2017-06-06 NOTE — Progress Notes (Signed)
PROGRESS NOTE    [name]  ZWC:[id number] DOB: [birth year] DOA: 05/27/2017 PCP: [name], MD    Brief Narrative:  Patient is a 72 year old female with past medical history of COPD, chronic diastolic CHF, chronic hypoxic respiratory failure, insulin-dependent diabetes, depression, paroxysmal A. 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 Patient had been receiving her chemotherapy infusion when she became lethargic and hypoxic and sent to emergency department for further evaluation. Chest x-ray on admission was suggestive of exacerbation of CHF and she has been currently managed for that.  she decompensated required more oxygen and became more short of breath and tachypneic, patient and husband were initially agreeable to transfer to skilled nursing facility for rehab prior to returning home, however given respiratory and cardiovascular decompensation patient is now agreeable to have further palliative care conversations about goals of care .     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 Dr Rosalva Ferron d/w Dr Julien Nordmann oncologist  there are no good treatment options, patient is now agreeable to talk to palliative care services about goals of care.  Palliative care was assessed patient and focus is shifting towards comfort.  Continue to diuresis for symptomatic treatment.  2)HFpEF-on 06/14/2017 patient respiratory status decompensated again, , Oxygen requirement is better as noted in #1 above,  patient with history of CHF with preserved ejection fraction, last known EF is 55-60%, and on IV Lasix.  Urine output not properly recorded.  Patient currently -3.131 L during his hospitalization.  Current weight is 117 pounds from 121 pounds.  Continue daily weights and strict I's and O's.  Patient does not have significant lower extremity edema, however she does have bilateral upper extremity edema due to SVC syndrome in the setting of lung malignancy  3)Acute COPD exacerbation:- Worse as  above #1, patient with significant congestion.  Continue current regimen of bronchodilators, steroid taper, Brovana, Pulmicort and scheduled duo nebs.  Chest PT.  Supportive care.   4)Stage IV non-small cell lung cancer:Follows with Dr. Julien Nordmann.Was on chemotherapy.Status post palliative radiotherapy.Has history of SVC syndrome. Became hypoxic during her infusion with Taxol and carboplatin on 05/27/17.Dr Malena Edman Dr Julien Nordmann on 06/04/17 who believes that palliative care consult is a reasonable option given patient's intolerance to palliative chemo.  Palliative care has assessed the patient, patient currently DNR and focus is shifting for comfort care.  Patient interested in residential hospice versus home with hospice.  5)Paroxysmal A. fib:   Cardizem for rate control. Eliquis for anticoagulation.  6)Type 2 diabetes mellitus: Previously uncontrolled,Hemoglobin A1c in November 2018 was 10.6, worsening her hyperglycemia due to steroids, should improve with steroid  taper.  These are ranging from 87-206.  Continue current regimen of Lantus 12  units daily.  Sliding scale insulin.  Meal coverage insulin NovoLog 3 units 3 times daily with meals and titrate accordingly.   7)Hypertension:  Stable on current regimen.  Follow.  8)Depression: No suicidal or homicidal ideations.  Continue Celexa.   9)Diarrhea-clinical improvement.  C. difficile PCR was negative.  Imodium as needed.  10)Leukopenia-likely chemo induced.  WBC trending up and currently at 6.3.  Patient's last  chemoRx 05/27/17.  11)Disposition-patient and husband were initially agreeable to transfer to skilled nursing facility for rehab prior to returning home, however given respiratory and cardiovascular decompensation patient is now agreeable to have further palliative care conversations about goals of care.  Patient with palliative care and patient leaning towards hospice care.  Focus is shifting to comfort measures.  Palliative care following and appreciate their input and recommendations.      DVT prophylaxis: Eliquis Code Status: DNR Family Communication: Updated patient.  No family at bedside.   Disposition Plan: Residential hospice versus home with hospice.    Consultants:   Palliative care: Vinie Sill NP 10/28/7856  Curb sided Dr. Curt Bears hematology oncology  Procedures:   Chest x-ray 05/27/2017, 05/31/2017  2D echo 05/28/2017    Antimicrobials:   None   Subjective: Patient in bed states had a better night last night with her breathing.  Some shortness of breath this morning.  Still congested.  Denies any chest pain.    Objective: Vitals:   06/05/17 2302 06/06/17 0322 06/06/17 0612 06/06/17 1034  BP:   114/66   Pulse:   100   Resp:   20   Temp:   97.6 F (36.4 C)   TempSrc:   Axillary   SpO2: 96% 94% 94% 93%  Weight:   53.5 kg (117 lb 15.1 oz)   Height:        Intake/Output Summary (Last 24 hours) at 06/06/2017 1140 Last data filed at 06/06/2017 0945 Gross per 24 hour  Intake 1080 ml  Output 200 ml  Net 880 ml   Filed  Weights   06/04/17 0634 06/05/17 0641 06/06/17 0612  Weight: 53.8 kg (118 lb 9.7 oz) 54.9 kg (121 lb 0.5 oz) 53.5 kg (117 lb 15.1 oz)    Examination:  General exam: Coughing.  Respiratory system: Some coarse breath sounds.  Congestion noted.  No crackles.  Respiratory effort normal. Cardiovascular system: S1 & S2 heard, RRR. No JVD, murmurs, rubs, gallops or clicks. No pedal edema. Gastrointestinal system: Abdomen is soft, nontender, nondistended. No organomegaly or masses felt. Normal bowel sounds heard. Central nervous system: Alert and oriented. No focal neurological deficits. Extremities: Symmetric 5 x 5 power. Skin: No rashes, lesions or ulcers Psychiatry: Judgement and insight appear normal. Mood & affect appropriate.     Data Reviewed: I have personally reviewed following labs and imaging studies  CBC: Recent Labs  Lab 06/01/17 0315 06/02/17 0842 06/03/17 0931 06/05/17 0445 06/06/17 0431  WBC 2.1* 2.9* 3.8* 6.4 6.3  NEUTROABS 1.8  --  2.8  --   --   HGB 10.3* 9.8* 10.0* 9.7* 9.6*  HCT 32.5* 30.9* 31.7* 30.1* 29.8*  MCV 101.9* 102.7* 101.6* 102.0* 103.5*  PLT 238 232 248 232 850   Basic Metabolic Panel: Recent Labs  Lab 06/01/17 0315 06/02/17 0842 06/03/17 0931 06/05/17 0445 06/06/17 0431  NA 135 133* 134* 134* 138  K 4.1 3.7 3.2* 3.1* 3.9  CL 89* 86* 86* 89*  93*  CO2 40* 40* 39* 39* 37*  GLUCOSE 115* 171* 132* 102* 151*  BUN 29* 24* 17 14 14   CREATININE 0.45 0.39* 0.32* 0.33* 0.32*  CALCIUM 8.3* 8.3* 8.2* 8.2* 8.3*  MG  --   --   --  1.8  --    GFR: Estimated Creatinine Clearance: 47.7 mL/min (A) (by C-G formula based on SCr of 0.32 mg/dL (L)). Liver Function Tests: No results for input(s): AST, ALT, ALKPHOS, BILITOT, PROT, ALBUMIN in the last 168 hours. No results for input(s): LIPASE, AMYLASE in the last 168 hours. No results for input(s): AMMONIA in the last 168 hours. Coagulation Profile: No results for input(s): INR, PROTIME in the last 168  hours. Cardiac Enzymes: No results for input(s): CKTOTAL, CKMB, CKMBINDEX, TROPONINI in the last 168 hours. BNP (last 3 results) No results for input(s): PROBNP in the last 8760 hours. HbA1C: No results for input(s): HGBA1C in the last 72 hours. CBG: Recent Labs  Lab 06/05/17 0809 06/05/17 1136 06/05/17 1710 06/05/17 2102 06/06/17 0750  GLUCAP 114* 389* 216* 253* 126*   Lipid Profile: No results for input(s): CHOL, HDL, LDLCALC, TRIG, CHOLHDL, LDLDIRECT in the last 72 hours. Thyroid Function Tests: No results for input(s): TSH, T4TOTAL, FREET4, T3FREE, THYROIDAB in the last 72 hours. Anemia Panel: No results for input(s): VITAMINB12, FOLATE, FERRITIN, TIBC, IRON, RETICCTPCT in the last 72 hours. Sepsis Labs: No results for input(s): PROCALCITON, LATICACIDVEN in the last 168 hours.  Recent Results (from the past 240 hour(s))  MRSA PCR Screening     Status: None   Collection Time: 05/29/17  8:40 AM  Result Value Ref Range Status   MRSA by PCR NEGATIVE NEGATIVE Final    Comment:        The GeneXpert MRSA Assay (FDA approved for NASAL specimens only), is one component of a comprehensive MRSA colonization surveillance program. It is not intended to diagnose MRSA infection nor to guide or monitor treatment for MRSA infections.   C difficile quick scan w PCR reflex     Status: None   Collection Time: 05/29/17 10:11 AM  Result Value Ref Range Status   C Diff antigen NEGATIVE NEGATIVE Final   C Diff toxin NEGATIVE NEGATIVE Final   C Diff interpretation No C. difficile detected.  Final    Comment: NEGATIVE         Radiology Studies: No results found.      Scheduled Meds: . apixaban  5 mg Oral BID  . arformoterol  15 mcg Nebulization BID  . atorvastatin  20 mg Oral q1800  . budesonide (PULMICORT) nebulizer solution  0.5 mg Nebulization BID  . citalopram  20 mg Oral Daily  . diltiazem  360 mg Oral Daily  . furosemide  40 mg Intravenous Q12H  . guaiFENesin   1,200 mg Oral BID  . insulin aspart  0-5 Units Subcutaneous QHS  . insulin aspart  0-9 Units Subcutaneous TID WC  . insulin aspart  3 Units Subcutaneous TID WC  . insulin glargine  12 Units Subcutaneous QHS  . ipratropium-albuterol  3 mL Nebulization Q4H  . losartan  50 mg Oral Daily  . mouth rinse  15 mL Mouth Rinse BID  . nystatin  5 mL Oral TID  . predniSONE  20 mg Oral Q breakfast  . sodium chloride flush  3 mL Intravenous Q12H   Continuous Infusions: . sodium chloride 250 mL (06/03/17 0500)     LOS: 10 days    Time spent:  35 mins    Irine Seal, MD [facility] Pager [phone]  If 7PM-7AM, please contact night-coverage [email or web address] Password [email or web address] 06/06/2017, 11:40 AM

## 2017-06-06 NOTE — Progress Notes (Signed)
Pt's HR elevated to 120s/130s sustained with spikes into the 160s. On call NP notified. 250cc bolus and 2.5mg  lopressor ordered and administered. Angelice Piech, Bing Neighbors, RN

## 2017-06-06 NOTE — Progress Notes (Signed)
Met with pt to discuss her wishes in light of conversation with palliative care team. Pt states she feels she is ready to pursue hospice care and "breathing is hard." She states she feels residential hospice would be first choice given her symptoms and needs "and if I could not go there maybe I could go home with hospice like my dad did." Pt had several questions re: nature of hospice facilities which CSW answered or directed her to discuss with liaison for clarity when able. Pt explains several family members had hospice at home and she wants to understand the differences.   Pt states [facility] would be her preference.  CSW will make referral and follow to assist.   Sharren Bridge, MSW, LCSW Clinical Social Work 06/06/2017 [phone]

## 2017-06-07 DIAGNOSIS — J441 Chronic obstructive pulmonary disease with (acute) exacerbation: Secondary | ICD-10-CM

## 2017-06-07 DIAGNOSIS — R69 Illness, unspecified: Secondary | ICD-10-CM

## 2017-06-07 LAB — BASIC METABOLIC PANEL
Anion gap: 6 (ref 5–15)
BUN: 17 mg/dL (ref 6–20)
CALCIUM: 8.2 mg/dL — AB (ref 8.9–10.3)
CO2: 38 mmol/L — AB (ref 22–32)
CREATININE: 0.31 mg/dL — AB (ref 0.44–1.00)
Chloride: 92 mmol/L — ABNORMAL LOW (ref 101–111)
GFR calc Af Amer: >60 mL/min (ref >60)
GFR calc non Af Amer: >60 mL/min (ref >60)
GLUCOSE: 98 mg/dL (ref 65–99)
Potassium: 3.5 mmol/L (ref 3.5–5.1)
Sodium: 136 mmol/L (ref 135–145)

## 2017-06-07 LAB — GLUCOSE, CAPILLARY
Glucose-Capillary: 65 mg/dL (ref 65–99)
Glucose-Capillary: 324 mg/dL — ABNORMAL HIGH (ref 65–99)
Glucose-Capillary: 166 mg/dL — ABNORMAL HIGH (ref 65–99)

## 2017-06-07 MED ORDER — MORPHINE SULFATE (CONCENTRATE) 10 MG/0.5ML PO SOLN: 5.0000 mg | ORAL | 0 refills | Status: AC | PRN

## 2017-06-07 MED ORDER — IPRATROPIUM-ALBUTEROL 0.5-2.5 (3) MG/3ML IN SOLN: 3.0000 mL | Freq: Three times a day (TID) | RESPIRATORY_TRACT | Status: DC

## 2017-06-07 MED ORDER — POTASSIUM CHLORIDE CRYS ER 20 MEQ PO TBCR
40.0000 meq | EXTENDED_RELEASE_TABLET | Freq: Once | ORAL | Status: AC
  Administered 2017-06-07: 40 meq via ORAL
  Filled 2017-06-07: qty 2

## 2017-06-07 MED ORDER — FUROSEMIDE 40 MG PO TABS: 40.0000 mg | ORAL_TABLET | Freq: Two times a day (BID) | ORAL | 0 refills | Status: AC

## 2017-06-07 MED ORDER — INSULIN GLARGINE 100 UNIT/ML ~~LOC~~ SOLN: 12.0000 [IU] | Freq: Every day | SUBCUTANEOUS | 0 refills | Status: AC

## 2017-06-07 MED ORDER — FUROSEMIDE 40 MG PO TABS
80.0000 mg | ORAL_TABLET | Freq: Once | ORAL | Status: AC
  Administered 2017-06-07: 80 mg via ORAL
  Filled 2017-06-07: qty 2

## 2017-06-07 NOTE — Progress Notes (Signed)
Report given to Deb at [facility] in [place].  Awaiting discharge order and transport. Andre Lefort

## 2017-06-07 NOTE — Progress Notes (Signed)
Pt discharged with plan to admit to [facility].  Arranged [facility] transportation. Husband at bedside aware and agreeable to plan.  Report # for RN [phone]  Sharren Bridge, MSW, LCSW Clinical Social Work 06/07/2017 [phone]

## 2017-06-07 NOTE — Consult Note (Signed)
[facility] referral today from [facility].  Had the pleasure of meeting pt and her husband at the bedside today.  Discussed the options of d/c home with Hospice services in her home vs d/c to our [place] at [facility].  Pt really wants to go to [facility] due to her significant respiratory symptoms.  Her husband agrees.  Discussed pt's case extensively with [facility] Medical Director-G. Wroblewski.  She has approved pt for admission to [facility] and bed is available for transfer today.  Notified Meghan.  Await MD d/c.  Thank you for the referral and the opportunity to serve pt and family during this difficult time.  Wynetta Fines, RN

## 2017-06-07 NOTE — Progress Notes (Signed)
Assumed care for pt. Agree with previous RN assessment. Pt in stable condition. Will continue to monitor. Arville Lime, RN

## 2017-06-07 NOTE — Progress Notes (Signed)
[facility] arrived to transport pt. VSS. No complaints by pt at this time. All belongings sent with pt.

## 2017-06-07 NOTE — Discharge Summary (Signed)
Physician Discharge Summary  [name] SWN:[id number] DOB: 1946-03-12 DOA: 05/27/2017  PCP: Kelsey Lass, MD  Admit date: 05/27/2017 Discharge date: 06/07/2017  Time spent: 65 minutes  Recommendations for Outpatient Follow-up:  1. Follow-up with MD at residential hospice home.   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  Diet recommendation: Regular  Filed Weights   06/06/17 0612 06/06/17 1816 06/07/17 0456  Weight: 53.5 kg (117 lb 15.1 oz) 53.1 kg (117 lb) 52.5 kg (115 lb 11.9 oz)    History of present illness:  Per [name] Level is a 72 y.o. 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  Patient had been receiving her first chemotherapy infusion when she was noted to be lethargic and hypoxic.  She was sent to the ED for further evaluation.  Family reported that her breathing had been increasingly labored in recent days.  Her oxygen saturations had also been lower recently, typically in the upper 80s per report of family.  ED Course: Upon arrival to the ED, patient was found to be afebrile, saturating 77% on 3 L/min of supplemental oxygen, slightly tachycardic, and with stable blood pressure.  EKG  featured a sinus tachycardia with rate 104 and nonspecific interventricular conduction delay.  Chest x-ray was notable for vascular congestion and small to moderate pleural effusions bilaterally.  Chemistry panel was notable for bicarbonate of 35.  CBC featured a microcytic anemia with hemoglobin of 10.7 and MCV of 102.  Patient was placed on BiPAP, given 40 mg IV Lasix, and DuoNeb.  She remained hemodynamically stable in the ED, but continued to require BiPAP, and will be admitted to the stepdown unit for ongoing evaluation and management of acute on chronic respiratory failure suspected secondary to COPD and acute on chronic diastolic CHF.     Hospital Course:  1)Acute onChronicHypercarbic/HypoxicRespiratoryFailure: Patient had presented with acute on chronic hypercarbic/hypoxic rotatory failure felt to be secondary to an acute CHF exacerbation, acute COPD exacerbation in the setting of stage IV non-small cell lung patient was initially placed in the stepdown unit on BiPAP and monitored.  Patient was aggressively diuresed with IV Lasix and placed on bronchodilators, steroid taper, Pulmicort, Brovana for COPD exacerbation.  Despite these measures patient with no significant improvement with worsening shortness of breath on minimal activity and increased O2 demands.  Patient was noted to be back on high flow oxygen ranging from 10-15 L/min.  Lasix dose was increased. [name] d/w Kelsey Mohamedoncologist there are no good treatment options, patient is now agreeable to talk to palliative care services about goals of care.  Palliative care was assessed patient and focus has shifted towards comfort.  Will be discharged to residential hospice home.   2)HFpEF-on 06/14/2017 patient respiratory status decompensated again,, Oxygen requirement is better as noted in #  1 above, patient with history of CHF with preserved ejection fraction, last known EF is 55-60%, and on IV Lasix during the hospitalization for  her acute respiratory failure.  Urine output not properly recorded during the hospitalization.  Patient currently -3.731 L during his hospitalization.  Current weight is 115 pounds from 117 pounds from 121 pounds.    His Lasix dose was increased.  Patient with improvement with lower extremity edema.  Patient with significant left  upper extremity edema due to SVC syndrome in the setting of lung malignancy.  Due to patient's worsening shortness of breath palliative care was consulted and focus was shifted to full comfort measures.  Patient will be discharged to residential hospice home.  3)Acute COPD exacerbation:-She was admitted due to concerns for an acute COPD exacerbation patient was placed on steroid taper, bronchodilators, Pulmicort, Brovana, scheduled duo nebs.  Patient was also ordered chest PT.  Patient had no significant clinical improvement with above modalities.  Palliative care was consulted and patient and family wish to shift to a palliative and full comfort mode.  Morphine was added to patient's regimen as needed for shortness of breath.  Patient will be discharged to a residential hospice home.  4)Stage IV non-small cell lung cancer:Follows with Kelsey. Julien Baxter.Was on chemotherapy.Status post palliative radiotherapy.Has history of SVC syndrome. Became hypoxic during her infusion with Taxol and carboplatin on 05/27/17.[name] [name] on 1/8/19who believes that palliative care consult is a reasonable option given patient's intolerance to palliative chemo.  Palliative care has assessed the patient, patient currently DNR and focus has shifted to full comfort care.  Patient will be discharged to a residential hospice home.  5)Paroxysmal A. fib:   Cardizem for rate control. Eliquis for anticoagulation.  6)Type 2 diabetes mellitus: Previously uncontrolled,Hemoglobin A1c in November 2018 was 10.6, worsening hyperglycemia due to steroids.  Patient's Lantus dose was adjusted and  patient maintained on sliding scale insulin as well as meal coverage insulin.  Blood sugars improved with steroid taper.  Steroid taper was completed by day of discharge.  Patient will be discharged on Lantus as well as oral hypoglycemic agents.  7)Hypertension: Stable on current regimen.  Follow.  8)Depression: No suicidal or homicidal ideations.  Continued on home regimen of Celexa.   9)Diarrhea- noted to have diarrhea on presentation.  C. difficile PCR was negative.  Patient was placed on Imodium as needed.  Patient with no further diarrhea.    10)Leukopenia-likely chemo induced.  WBC trended up to 6.3.  Patient's last  chemoRx 05/27/17.  11)Disposition-patient and husbandwere initiallyagreeable to transfer to skilled nursing facility for rehab prior to returning home,however given respiratory and cardiovascular decompensation patient is now agreeable to have further palliative care conversations about goals of care.  Patient with palliative care and patient leaning towards hospice care.  Focus shifted to comfort measures.    Was followed by palliative care and patient will be discharged to residential hospice home.        Procedures:  Chest x-ray 05/27/2017, 05/31/2017  2D echo 05/28/2017      Consultations:  Palliative care: Vinie Sill NP 07/04/348  Curb sided Kelsey. Curt Bears hematology oncology      Discharge Exam: Vitals:   06/07/17 0936 06/07/17 1436  BP:    Pulse:    Resp:    Temp:    SpO2: 96% 96%    General: NAD Cardiovascular: RRR Respiratory: CTAB  Discharge Instructions   Discharge Instructions    Diet general   Complete  by:  As directed    Increase activity slowly   Complete by:  As directed      Allergies as of 06/07/2017      Reactions   Penicillins Hives   Has patient had a PCN reaction causing immediate rash, facial/tongue/throat swelling, SOB or lightheadedness with hypotension: yes Has patient had a PCN reaction causing  severe rash involving mucus membranes or skin necrosis: no Has patient had a PCN reaction that required hospitalization: yes Has patient had a PCN reaction occurring within the last 10 years: no If all of the above answers are "NO", then may proceed with Cephalosporin use.   Tiotropium Bromide Monohydrate Other (See Comments)   EYE PAIN KIDNEY FUNCTION SLOWED DOWN      Medication List    TAKE these medications   acetaminophen 500 MG tablet Commonly known as:  TYLENOL Take 500 mg by mouth every 8 (eight) hours as needed for mild pain or headache.   apixaban 5 MG Tabs tablet Commonly known as:  ELIQUIS Take 1 tablet by mouth twice daily.   blood glucose meter kit and supplies Kit Dispense based on patient and insurance preference. Use up to four times daily as directed. (FOR ICD-9 250.00, 250.01).   citalopram 20 MG tablet Commonly known as:  CELEXA Take 20 mg daily by mouth.   diltiazem 360 MG 24 hr capsule Commonly known as:  CARDIZEM CD Take 1 capsule (360 mg total) by mouth daily.   FLUTTER Devi 1 Device as needed by Does not apply route.   furosemide 40 MG tablet Commonly known as:  LASIX Take 1 tablet (40 mg total) by mouth 2 (two) times daily. What changed:    medication strength  when to take this   glipiZIDE 5 MG tablet Commonly known as:  GLUCOTROL Take 1 tablet (5 mg total) by mouth daily before breakfast.   Glycopyrrolate-Formoterol 9-4.8 MCG/ACT Aero Commonly known as:  BEVESPI AEROSPHERE Inhale 2 puffs 2 (two) times daily into the lungs.   insulin glargine 100 UNIT/ML injection Commonly known as:  LANTUS Inject 0.12 mLs (12 Units total) into the skin at bedtime. What changed:  how much to take   ipratropium-albuterol 0.5-2.5 (3) MG/3ML Soln Commonly known as:  DUONEB Take 3 mLs by nebulization every 6 (six) hours as needed. What changed:  reasons to take this   losartan 50 MG tablet Commonly known as:  COZAAR Take 50 mg by mouth daily.    morphine CONCENTRATE 10 MG/0.5ML Soln concentrated solution Take 0.25 mLs (5 mg total) by mouth every 2 (two) hours as needed for severe pain or shortness of breath.   MUCINEX MAXIMUM STRENGTH 1200 MG Tb12 Generic drug:  Guaifenesin Take 1,200 mg by mouth 2 (two) times daily.   non-metallic deodorant Misc Commonly known as:  ALRA Apply 1 application topically.   nystatin 100000 UNIT/ML suspension Commonly known as:  MYCOSTATIN Take 5 mLs (500,000 Units total) 3 (three) times daily by mouth. Make sure to swish and swallow.   polyethylene glycol packet Commonly known as:  MIRALAX / GLYCOLAX Take 17 g by mouth daily. What changed:    when to take this  reasons to take this   prochlorperazine 10 MG tablet Commonly known as:  COMPAZINE Take 1 tablet (10 mg total) by mouth every 6 (six) hours as needed for nausea or vomiting.   simvastatin 40 MG tablet Commonly known as:  ZOCOR Take 40 mg by mouth every evening.   SONAFINE Apply 1  application topically 2 (two) times daily. Apply after rad txs and at bedtimes daily,nothing 4 hours prior to rad tx   Spacer/Aero Chamber Mouthpiece Misc 1 Device by Does not apply route as directed.      Allergies  Allergen Reactions  . Penicillins Hives    Has patient had a PCN reaction causing immediate rash, facial/tongue/throat swelling, SOB or lightheadedness with hypotension: yes Has patient had a PCN reaction causing severe rash involving mucus membranes or skin necrosis: no Has patient had a PCN reaction that required hospitalization: yes Has patient had a PCN reaction occurring within the last 10 years: no If all of the above answers are "NO", then may proceed with Cephalosporin use.   . Tiotropium Bromide Monohydrate Other (See Comments)    EYE PAIN KIDNEY FUNCTION SLOWED DOWN   Follow-up Information    MD AT HOSPICE Follow up.            The results of significant diagnostics from this hospitalization (including imaging,  microbiology, ancillary and laboratory) are listed below for reference.    Significant Diagnostic Studies: Dg Chest Port 1 View  Result Date: 05/31/2017 CLINICAL DATA:  Shortness of breath. EXAM: PORTABLE CHEST 1 VIEW COMPARISON:  Radiograph of May 27, 2017. FINDINGS: Stable cardiomediastinal silhouette no pneumothorax is noted. Stable bilateral pleural effusions are noted with probable associated atelectasis. Atherosclerosis of thoracic aorta is noted. Bony thorax is unremarkable. IMPRESSION: Aortic atherosclerosis. Stable bilateral pleural effusions with associated atelectasis. Electronically Signed   By: Marijo Conception, M.D.   On: 05/31/2017 07:11   Dg Chest Port 1 View  Result Date: 05/27/2017 CLINICAL DATA:  Dyspnea and lethargy EXAM: PORTABLE CHEST 1 VIEW COMPARISON:  04/24/2017 FINDINGS: Obscuration of the diaphragms consistent with small to moderate bilateral pleural effusions. Low lung volumes with vascular congestion. Aortic atherosclerosis at the arch without definite aneurysm. The heart is partially obscured by the pleural effusions. No acute osseous abnormality. ACDF of the lower cervical spine. IMPRESSION: Low lung volumes with vascular congestion and small to moderate bilateral pleural effusions consistent with stigmata of CHF. Electronically Signed   By: Ashley Royalty M.D.   On: 05/27/2017 22:35    Microbiology: Recent Results (from the past 240 hour(s))  MRSA PCR Screening     Status: None   Collection Time: 05/29/17  8:40 AM  Result Value Ref Range Status   MRSA by PCR NEGATIVE NEGATIVE Final    Comment:        The GeneXpert MRSA Assay (FDA approved for NASAL specimens only), is one component of a comprehensive MRSA colonization surveillance program. It is not intended to diagnose MRSA infection nor to guide or monitor treatment for MRSA infections.   C difficile quick scan w PCR reflex     Status: None   Collection Time: 05/29/17 10:11 AM  Result Value Ref Range  Status   C Diff antigen NEGATIVE NEGATIVE Final   C Diff toxin NEGATIVE NEGATIVE Final   C Diff interpretation No C. difficile detected.  Final    Comment: NEGATIVE     Labs: Basic Metabolic Panel: Recent Labs  Lab 06/02/17 0842 06/03/17 0931 06/05/17 0445 06/06/17 0431 06/07/17 0500  NA 133* 134* 134* 138 136  K 3.7 3.2* 3.1* 3.9 3.5  CL 86* 86* 89* 93* 92*  CO2 40* 39* 39* 37* 38*  GLUCOSE 171* 132* 102* 151* 98  BUN 24* '17 14 14 17  '$ CREATININE 0.39* 0.32* 0.33* 0.32* 0.31*  CALCIUM 8.3* 8.2* 8.2*  8.3* 8.2*  MG  --   --  1.8  --   --    Liver Function Tests: No results for input(s): AST, ALT, ALKPHOS, BILITOT, PROT, ALBUMIN in the last 168 hours. No results for input(s): LIPASE, AMYLASE in the last 168 hours. No results for input(s): AMMONIA in the last 168 hours. CBC: Recent Labs  Lab 06/01/17 0315 06/02/17 0842 06/03/17 0931 06/05/17 0445 06/06/17 0431  WBC 2.1* 2.9* 3.8* 6.4 6.3  NEUTROABS 1.8  --  2.8  --   --   HGB 10.3* 9.8* 10.0* 9.7* 9.6*  HCT 32.5* 30.9* 31.7* 30.1* 29.8*  MCV 101.9* 102.7* 101.6* 102.0* 103.5*  PLT 238 232 248 232 247   Cardiac Enzymes: No results for input(s): CKTOTAL, CKMB, CKMBINDEX, TROPONINI in the last 168 hours. BNP: BNP (last 3 results) Recent Labs    02/22/17 2249  BNP 15.0    ProBNP (last 3 results) No results for input(s): PROBNP in the last 8760 hours.  CBG: Recent Labs  Lab 06/06/17 1153 06/06/17 1745 06/06/17 2206 06/07/17 0738 06/07/17 1145  GLUCAP 206* 87 243* 65 324*       Signed:  Irine Seal MD.  [facility] 06/07/2017, 4:30 PM

## 2017-06-07 NOTE — Progress Notes (Signed)
OT Cancellation Note  Patient Details Name: [name] MRN: [id number] DOB: 02-15-1946   Cancelled Treatment:    Reason Eval/Treat Not Completed: Other (comment).  SW notes that pt is interested in residential hospice. Will sign off  Jeet Shough 06/07/2017, 12:52 PM  Lesle Chris, OTR/L [phone] 06/07/2017

## 2017-06-07 NOTE — Progress Notes (Signed)
Inpatient Diabetes Program Recommendations  AACE/ADA: New Consensus Statement on Inpatient Glycemic Control (2015)  Target Ranges:  Prepandial:   less than 140 mg/dL      Peak postprandial:   less than 180 mg/dL (1-2 hours)      Critically ill patients:  140 - 180 mg/dL   Lab Results  Component Value Date   GLUCAP 65 06/07/2017   HGBA1C 10.6 (H) 04/11/2017    Review of Glycemic Control Results for [name], [name] (MRN [id number]) as of 06/07/2017 10:45  Ref. Range 06/06/2017 11:53 06/06/2017 17:45 06/06/2017 22:06 06/07/2017 05:00 06/07/2017 07:38  Glucose-Capillary Latest Ref Range: 65 - 99 mg/dL 206 (H) 87 243 (H)  65   Diabetes history: Type 2 DM Outpatient Diabetes medications: Glipizide 5mg  in AM, Lantus 10 Units HS Current orders for Inpatient glycemic control: Novolog 3 Units TIDWC, Lantus 12 Units QHS, Novolog 0-5 Units QHS, Novolog 0-9 Units TTIDWC  Inpatient Diabetes Program Recommendations:    Hypoglycemic episode this AM, 65 mg/dL. Consider decreasing Lantus to 10 units QHS (52.5 kg x 0.2, and home regimen dose).   Thanks, Bronson Curb, MSN, RNC-OB Diabetes Coordinator [phone] (8a-5p)

## 2017-06-07 NOTE — Progress Notes (Signed)
   06/07/17 1600  Clinical Encounter Type  Visited With Patient  Visit Type Follow-up  Spiritual Encounters  Spiritual Needs Prayer   Followed up on a visit from Wednesday.  Patient indicated she will be going to Hospice in [facility].  She states over and over that she is ready and at peace and tired.  She has said that she and her family have talked about this and she it knows it will be hard for them.  We prayed together. Will follow as needed. Chaplain Katherene Ponto

## 2017-06-13 NOTE — Progress Notes (Signed)
  Radiation Oncology         [phone] ________________________________  Name: [name] MRN: [id number]  Date: 05/17/2017  DOB: 1945/09/15  End of Treatment Note  Diagnosis:  72 y.o. female with T2N0M0 non small cell lung cancer   Indication for treatment::  curative       Radiation treatment dates:   03/28/2017 - 05/17/2017  Site/dose:   The patient was treated to the disease within the right lung initially to a dose of 60 Gy using a 4 field, 3-D conformal technique. The patient then received a cone down boost treatment for an additional 6 Gy. This yielded a final total dose of 66 Gy.   Narrative: The patient tolerated radiation treatment relatively well.   The patient did not experience esophagitis during the course of treatment which required management. She did report a persistent cough and shortness of breath. She also developed a rash on her chest with dry desquamation.  Plan: The patient has completed radiation treatment. The patient will return to radiation oncology clinic for routine followup in one month. I advised the patient to call or return sooner if they have any questions or concerns related to their recovery or treatment. ________________________________  Jodelle Gross, MD, PhD  This document serves as a record of services personally performed by Kyung Rudd, MD. It was created on his behalf by Rae Lips, a trained medical scribe. The creation of this record is based on the scribe's personal observations and the provider's statements to them. This document has been checked and approved by the attending provider.

## 2017-06-14 ENCOUNTER — Telehealth: Payer: Self-pay | Admitting: Internal Medicine

## 2017-06-14 NOTE — Telephone Encounter (Signed)
Cancel all appointment per Dr 1/17 patient is in Hospice

## 2017-06-17 ENCOUNTER — Ambulatory Visit: Payer: Medicare Other

## 2017-06-17 ENCOUNTER — Ambulatory Visit: Payer: Medicare Other | Admitting: Oncology

## 2017-06-17 ENCOUNTER — Other Ambulatory Visit: Payer: Medicare Other

## 2017-06-19 ENCOUNTER — Ambulatory Visit: Payer: Medicare Other

## 2017-06-25 ENCOUNTER — Encounter: Payer: Self-pay | Admitting: *Deleted

## 2017-06-25 ENCOUNTER — Ambulatory Visit: Admission: RE | Admit: 2017-06-25 | Payer: Medicare Other | Source: Ambulatory Visit | Admitting: Radiation Oncology

## 2017-06-25 ENCOUNTER — Telehealth: Payer: Self-pay | Admitting: *Deleted

## 2017-06-25 NOTE — Telephone Encounter (Signed)
Faxed history and physical and physician chart notes (date Nov-Dec 2018) to [facility] at [phone].

## 2017-06-25 NOTE — Progress Notes (Signed)
[place] with Mr. Crisol Muecke Mrs. Rochette is under hospice care and will not come for her appointment today.

## 2017-07-08 ENCOUNTER — Ambulatory Visit: Payer: Medicare Other

## 2017-07-08 ENCOUNTER — Ambulatory Visit: Payer: Medicare Other | Admitting: Oncology

## 2017-07-08 ENCOUNTER — Other Ambulatory Visit: Payer: Medicare Other

## 2017-07-10 ENCOUNTER — Ambulatory Visit: Payer: Medicare Other

## 2017-07-23 ENCOUNTER — Ambulatory Visit: Payer: Medicare Other | Admitting: Pulmonary Disease

## 2017-07-23 NOTE — Progress Notes (Deleted)
Subjective:   PATIENT ID: [name] GENDER: female DOB: December 30, 1945, MRN: [id number]  Synopsis: Former patient of Dr. Ashok Cordia with Stage IV lung cancer, history of SVC syndrome, COPD and CHF.  HPI  No chief complaint on file.   ***January 2019 discharged to hospice  Past Medical History:  Diagnosis Date  . Arthritis   . Asthma   . Cancer ([place])   . COPD (chronic obstructive pulmonary disease) ([place])   . Depression   . Diabetes mellitus    diet controlled  . History of hiatal hernia   . Hyperlipemia   . Hypertension   . Pneumonia      Family History  Problem Relation Age of Onset  . Diabetes Mother   . Colon cancer Father   . Cerebral palsy Brother   . Breast cancer Paternal Aunt   . Breast cancer Cousin   . Prostate cancer Paternal Uncle   . Bone cancer Maternal Aunt      Social History   Socioeconomic History  . Marital status: Married    Spouse name: Not on file  . Number of children: Not on file  . Years of education: Not on file  . Highest education level: Not on file  Social Needs  . Financial resource strain: Not on file  . Food insecurity - worry: Not on file  . Food insecurity - inability: Not on file  . Transportation needs - medical: Not on file  . Transportation needs - non-medical: Not on file  Occupational History  . Not on file  Tobacco Use  . Smoking status: Former Smoker    Packs/day: 1.00    Years: 53.00    Pack years: 53.00    Start date: 12/22/1962    Last attempt to quit: 02/25/2017    Years since quitting: 0.4  . Smokeless tobacco: Never Used  . Tobacco comment: Peak rate 1.5ppd - quit at most 6 months  Substance and Sexual Activity  . Alcohol use: No  . Drug use: No  . Sexual activity: Not Currently  Other Topics Concern  . Not on file  Social History Narrative   [facility] (02/23/17):   Originally from [place]. Previously has lived in [place] as well as [place]. Previously worked with a Engineer, structural  and in Engineer, materials. Remote exposure to a parrot. Currently lives with her husband. Retired.     Allergies  Allergen Reactions  . Penicillins Hives    Has patient had a PCN reaction causing immediate rash, facial/tongue/throat swelling, SOB or lightheadedness with hypotension: yes Has patient had a PCN reaction causing severe rash involving mucus membranes or skin necrosis: no Has patient had a PCN reaction that required hospitalization: yes Has patient had a PCN reaction occurring within the last 10 years: no If all of the above answers are "NO", then may proceed with Cephalosporin use.   . Tiotropium Bromide Monohydrate Other (See Comments)    EYE PAIN KIDNEY FUNCTION SLOWED DOWN     Outpatient Medications Prior to Visit  Medication Sig Dispense Refill  . acetaminophen (TYLENOL) 500 MG tablet Take 500 mg by mouth every 8 (eight) hours as needed for mild pain or headache.    Marland Kitchen apixaban (ELIQUIS) 5 MG TABS tablet Take 1 tablet by mouth twice daily. 60 tablet 5  . blood glucose meter kit and supplies KIT Dispense based on patient and insurance preference. Use up to four times daily as directed. (FOR ICD-9 250.00, 250.01). 1 each  0  . citalopram (CELEXA) 20 MG tablet Take 20 mg daily by mouth.     . diltiazem (CARDIZEM CD) 360 MG 24 hr capsule Take 1 capsule (360 mg total) by mouth daily. 30 capsule 5  . furosemide (LASIX) 40 MG tablet Take 1 tablet (40 mg total) by mouth 2 (two) times daily. 60 tablet 0  . glipiZIDE (GLUCOTROL) 5 MG tablet Take 1 tablet (5 mg total) by mouth daily before breakfast.    . Glycopyrrolate-Formoterol (BEVESPI AEROSPHERE) 9-4.8 MCG/ACT AERO Inhale 2 puffs 2 (two) times daily into the lungs. 1 Inhaler 0  . Guaifenesin (MUCINEX MAXIMUM STRENGTH) 1200 MG TB12 Take 1,200 mg by mouth 2 (two) times daily.    . insulin glargine (LANTUS) 100 UNIT/ML injection Inject 0.12 mLs (12 Units total) into the skin at bedtime. 10 mL 0  . ipratropium-albuterol (DUONEB) 0.5-2.5  (3) MG/3ML SOLN Take 3 mLs by nebulization every 6 (six) hours as needed. (Patient taking differently: Take 3 mLs by nebulization every 6 (six) hours as needed (sob and wheezing). ) 120 mL 1  . losartan (COZAAR) 50 MG tablet Take 50 mg by mouth daily.  0  . Morphine Sulfate (MORPHINE CONCENTRATE) 10 MG/0.5ML SOLN concentrated solution Take 0.25 mLs (5 mg total) by mouth every 2 (two) hours as needed for severe pain or shortness of breath. 30 mL 0  . non-metallic deodorant (ALRA) MISC Apply 1 application topically.    . nystatin (MYCOSTATIN) 100000 UNIT/ML suspension Take 5 mLs (500,000 Units total) 3 (three) times daily by mouth. Make sure to swish and swallow. 150 mL 1  . polyethylene glycol (MIRALAX / GLYCOLAX) packet Take 17 g by mouth daily. (Patient taking differently: Take 17 g by mouth daily as needed for mild constipation. ) 14 each 0  . prochlorperazine (COMPAZINE) 10 MG tablet Take 1 tablet (10 mg total) by mouth every 6 (six) hours as needed for nausea or vomiting. 30 tablet 0  . Respiratory Therapy Supplies (FLUTTER) DEVI 1 Device as needed by Does not apply route. 1 each 0  . simvastatin (ZOCOR) 40 MG tablet Take 40 mg by mouth every evening.    Marland Kitchen Spacer/Aero Chamber Mouthpiece MISC 1 Device by Does not apply route as directed. 1 each 0  . Wound Dressings (SONAFINE) Apply 1 application topically 2 (two) times daily. Apply after rad txs and at bedtimes daily,nothing 4 hours prior to rad tx     No facility-administered medications prior to visit.     ROS    Objective:  Physical Exam   There were no vitals filed for this visit.  ***  CBC    Component Value Date/Time   WBC 6.3 06/06/2017 0431   RBC 2.88 (L) 06/06/2017 0431   HGB 9.6 (L) 06/06/2017 0431   HGB 10.7 (L) 05/27/2017 1053   HCT 29.8 (L) 06/06/2017 0431   HCT 33.0 (L) 05/27/2017 1053   PLT 247 06/06/2017 0431   PLT 261 05/27/2017 1053   MCV 103.5 (H) 06/06/2017 0431   MCV 101.2 (H) 05/27/2017 1053   MCH 33.3  06/06/2017 0431   MCHC 32.2 06/06/2017 0431   RDW 17.4 (H) 06/06/2017 0431   RDW 19.7 (H) 05/27/2017 1053   LYMPHSABS 0.4 (L) 06/03/2017 0931   LYMPHSABS 0.2 (L) 05/27/2017 1053   MONOABS 0.4 06/03/2017 0931   MONOABS 0.6 05/27/2017 1053   EOSABS 0.3 06/03/2017 0931   EOSABS 0.2 05/27/2017 1053   BASOSABS 0.0 06/03/2017 0931   BASOSABS 0.0 05/27/2017  1053     6MWT 03/08/17:  Walked 3 laps / Baseline Sat 90% on Room Air / Nadir Sat 84% on Room Air   IMAGING PET CT 03/25/17***: IMPRESSION: 1. Central obstructing right paratracheal and right hilar lung mass exhibits intense FDG uptake and there is associated complete atelectasis and consolidation of the right upper lobe. 2. Cavitary lung lesion within the left upper lobe is also hypermetabolic and may represent a focus of metastatic disease or a second primary lung neoplasm. 3. No evidence for distant hypermetabolic metastasis 4. Aortic atherosclerosis.  CTA CHEST 02/23/17 ***. Small pericardial effusion. Mild apical predominant emphysematous changes. Approximately 2.6 cm right adrenal nodule appreciated. No obvious skeletal metastases. Right upper lobe central mass noted with evidence of what appears to be lymphangitic spread. Right suprahilar soft tissue density suspicious for mass versus pathologically enlarged lymph nodes causing either extrinsic compression of airway or endobronchial invasion. No obvious pulmonary arterial invasion. Unable to appreciate pathologic mediastinal or right hilar adenopathy with possible mass invasion. No pleural effusion or thickening appreciated. Linear opacity left lung base suggestive of atelectasis. Necrotic mass present within the lingula measuring up to 2.1 cm. Additional left upper lobe nodule measuring approximately 1 cm identified. Radiology noted mass effect on the IVC with possible invasion as well as a small pericardial effusion.  CARDIAC TTE (02/23/17):  LV normal in size with EF 65-70%. No  regional wall motion abnormalities & grade 1 diastolic dysfunction. LA & RA normal in size. RV normal in size and function. Pulmonary artery systolic pressure 32 mmHg. No aortic stenosis or regurgitation. No mitral stenosis or regurgitation. No significant pulmonic regurgitation with poorly visualized valve. No tricuspid regurgitation. No pericardial effusion.   PATHOLOGY Right Upper Lobe CT-guided Biopsy 03/27/17:  Poorly differentiated carcinoma EBUS FNA LEVEL 7 03/18/17:  Rare atypical cells Right Upper Lobe Lavage Cytology 02/25/17:  Atypical Cells Present Right Upper Lobe Endobronchial Brushing 02/25/17:  Negative for Malignancy  MICROBIOLOGY Blood Cultures x2 02/23/17:  Negative  Urine Culture 02/22/17:  Multiple species Urine Streptococcal Antigen 02/23/17:  Negative  Urine Legionella Antigen 02/23/17:  Negative  Quantitative BAL Culture RUL 02/25/17:  Normal respiratory flora   Records from the January 2019 hospitalization reviewed where the patient was treated for a CHF and COPD exacerbation and discharged to hospice.     Assessment & Plan:   No diagnosis found.  Discussion: ***    Current Outpatient Medications:  .  acetaminophen (TYLENOL) 500 MG tablet, Take 500 mg by mouth every 8 (eight) hours as needed for mild pain or headache., Disp: , Rfl:  .  apixaban (ELIQUIS) 5 MG TABS tablet, Take 1 tablet by mouth twice daily., Disp: 60 tablet, Rfl: 5 .  blood glucose meter kit and supplies KIT, Dispense based on patient and insurance preference. Use up to four times daily as directed. (FOR ICD-9 250.00, 250.01)., Disp: 1 each, Rfl: 0 .  citalopram (CELEXA) 20 MG tablet, Take 20 mg daily by mouth. , Disp: , Rfl:  .  diltiazem (CARDIZEM CD) 360 MG 24 hr capsule, Take 1 capsule (360 mg total) by mouth daily., Disp: 30 capsule, Rfl: 5 .  furosemide (LASIX) 40 MG tablet, Take 1 tablet (40 mg total) by mouth 2 (two) times daily., Disp: 60 tablet, Rfl: 0 .  glipiZIDE (GLUCOTROL) 5 MG  tablet, Take 1 tablet (5 mg total) by mouth daily before breakfast., Disp: , Rfl:  .  Glycopyrrolate-Formoterol (BEVESPI AEROSPHERE) 9-4.8 MCG/ACT AERO, Inhale 2 puffs 2 (two) times  daily into the lungs., Disp: 1 Inhaler, Rfl: 0 .  Guaifenesin (MUCINEX MAXIMUM STRENGTH) 1200 MG TB12, Take 1,200 mg by mouth 2 (two) times daily., Disp: , Rfl:  .  insulin glargine (LANTUS) 100 UNIT/ML injection, Inject 0.12 mLs (12 Units total) into the skin at bedtime., Disp: 10 mL, Rfl: 0 .  ipratropium-albuterol (DUONEB) 0.5-2.5 (3) MG/3ML SOLN, Take 3 mLs by nebulization every 6 (six) hours as needed. (Patient taking differently: Take 3 mLs by nebulization every 6 (six) hours as needed (sob and wheezing). ), Disp: 120 mL, Rfl: 1 .  losartan (COZAAR) 50 MG tablet, Take 50 mg by mouth daily., Disp: , Rfl: 0 .  Morphine Sulfate (MORPHINE CONCENTRATE) 10 MG/0.5ML SOLN concentrated solution, Take 0.25 mLs (5 mg total) by mouth every 2 (two) hours as needed for severe pain or shortness of breath., Disp: 30 mL, Rfl: 0 .  non-metallic deodorant (ALRA) MISC, Apply 1 application topically., Disp: , Rfl:  .  nystatin (MYCOSTATIN) 100000 UNIT/ML suspension, Take 5 mLs (500,000 Units total) 3 (three) times daily by mouth. Make sure to swish and swallow., Disp: 150 mL, Rfl: 1 .  polyethylene glycol (MIRALAX / GLYCOLAX) packet, Take 17 g by mouth daily. (Patient taking differently: Take 17 g by mouth daily as needed for mild constipation. ), Disp: 14 each, Rfl: 0 .  prochlorperazine (COMPAZINE) 10 MG tablet, Take 1 tablet (10 mg total) by mouth every 6 (six) hours as needed for nausea or vomiting., Disp: 30 tablet, Rfl: 0 .  Respiratory Therapy Supplies (FLUTTER) DEVI, 1 Device as needed by Does not apply route., Disp: 1 each, Rfl: 0 .  simvastatin (ZOCOR) 40 MG tablet, Take 40 mg by mouth every evening., Disp: , Rfl:  .  Spacer/Aero Chamber Mouthpiece MISC, 1 Device by Does not apply route as directed., Disp: 1 each, Rfl: 0 .   Wound Dressings (SONAFINE), Apply 1 application topically 2 (two) times daily. Apply after rad txs and at bedtimes daily,nothing 4 hours prior to rad tx, Disp: , Rfl:

## 2017-07-26 DEATH — deceased

## 2017-08-09 ENCOUNTER — Ambulatory Visit: Payer: Medicare Other | Admitting: Internal Medicine

## 2018-04-15 NOTE — Telephone Encounter (Signed)
error 

## 2018-09-27 IMAGING — CT NM PET TUM IMG INITIAL (PI) SKULL BASE T - THIGH
8 series · 20 of 25 positions shown · non-contrast
Comparison: CT chest 02/23/2017

CLINICAL DATA: Initial treatment strategy for lung mass.

EXAM:
NUCLEAR MEDICINE PET SKULL BASE TO THIGH
TECHNIQUE: 6.3 MCi F-18 FDG was injected intravenously. Full-ring PET imaging
was performed from the skull base to thigh after the radiotracer. CT
data was obtained and used for attenuation correction and anatomic
localization.
FASTING BLOOD GLUCOSE:  Value: 180 mg/dl

[Series 3: pet sk_thigh ac · axial · 5.0mm · 4.07mm/px · z∈[-905,-85]mm · 3 of 206 slices shown]
[im 1/206]
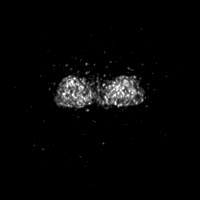
[im 69/206]
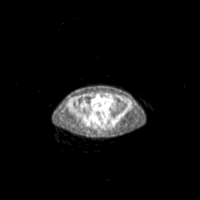
[im 206/206]
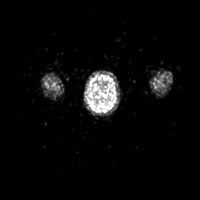

[Series 4: ct sk_thigh 5.0 b31f · axial · 5.0mm · 0.98mm/px · z∈[-905,-85]mm · 4 of 206 slices shown]
[im 1/206]
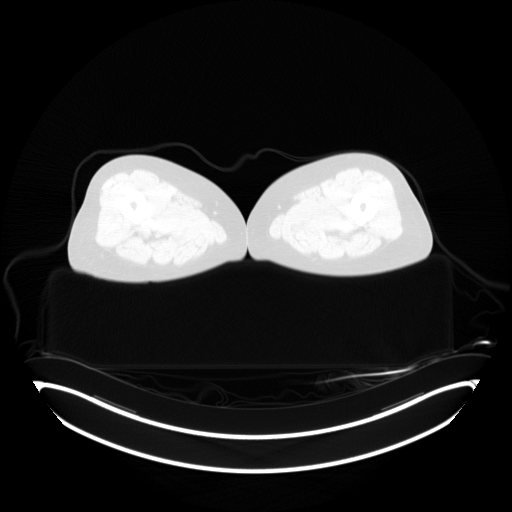
[im 52/206]
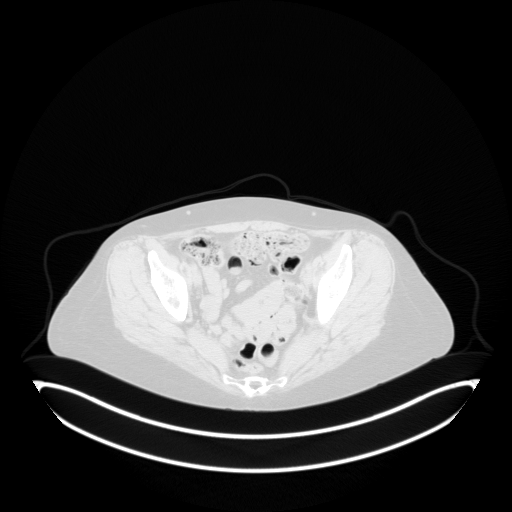
[im 103/206]
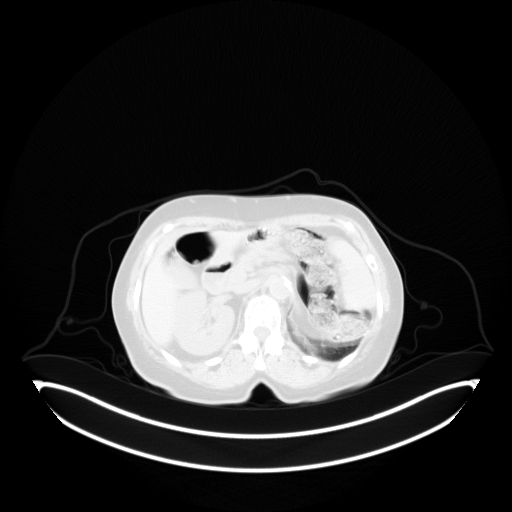
[im 206/206  brain]
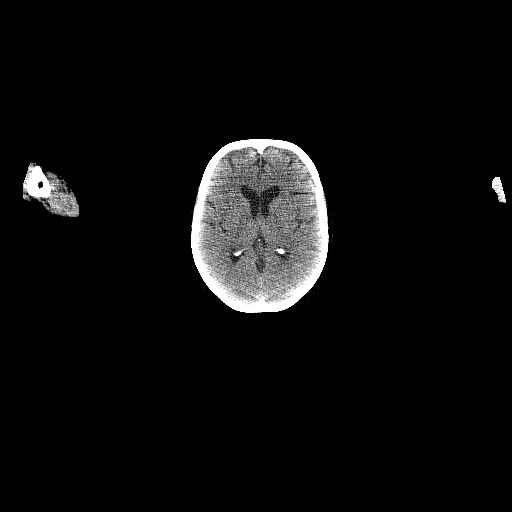

[Series 5: pet sk_thigh nac · axial · 5.0mm · 4.07mm/px · z∈[-905,-85]mm · 4 of 206 slices shown]
[im 1/206]
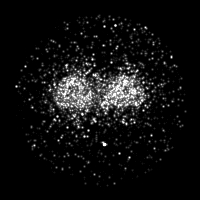
[im 52/206]
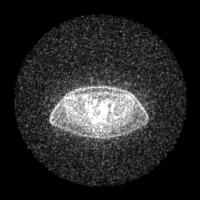
[im 103/206]
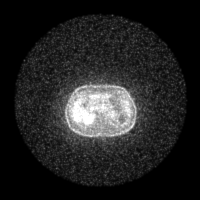
[im 206/206]
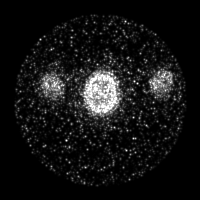

[Series 8: ct sk_thigh 5.0 b70f (id)_bone · axial · 5.0mm · 0.58mm/px · z∈[-479,-227]mm · 2 of 64 slices shown]
[im 1/64  bone]
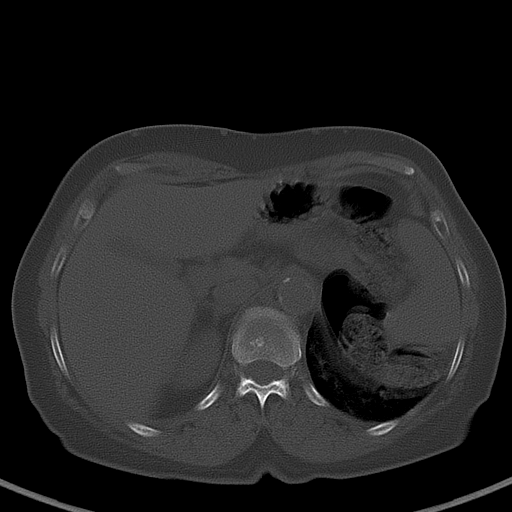
[im 64/64  bone]
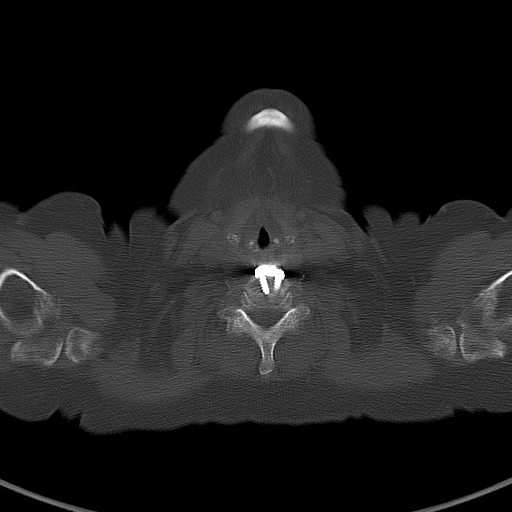

[Series 603: range-ct sk_thigh 5.0 (id)<alpha range> · 1 of 77 slices shown (1 of 2)]
[im 1/77]
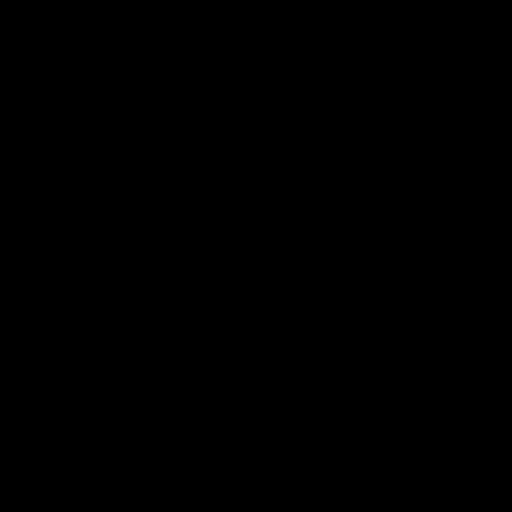

[Series 604: mip range · coronal · 1.70mm/px · 1 of 32 slices shown]
[im 1/32]
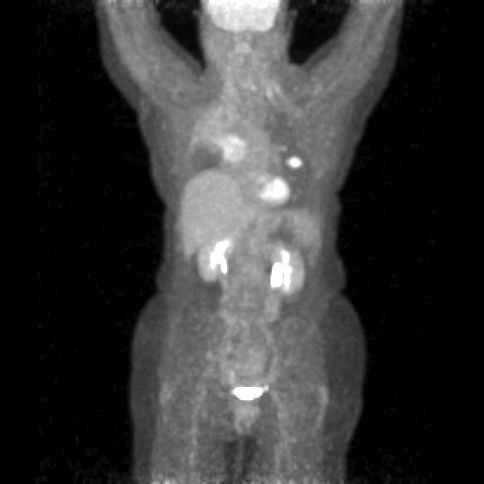

[Series 605: range-ct sk_thigh 5.0 (id)<alpha range> · 4 of 195 slices shown (2 of 2)]
[im 1/195]
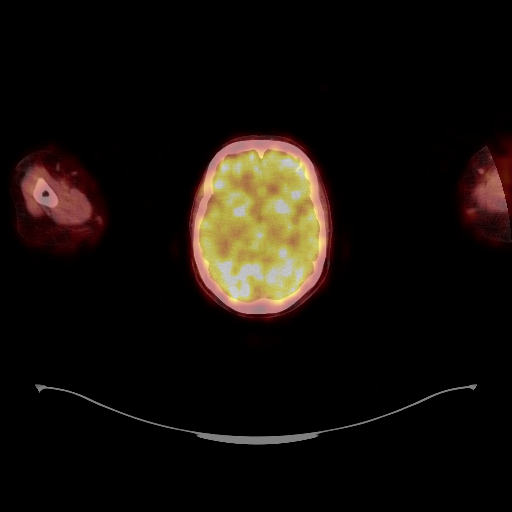
[im 49/195]
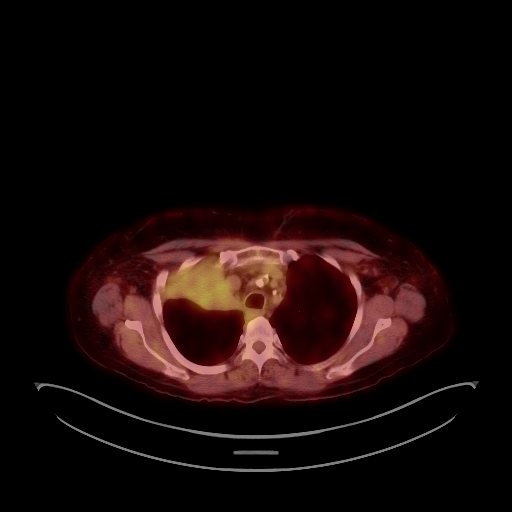
[im 98/195]
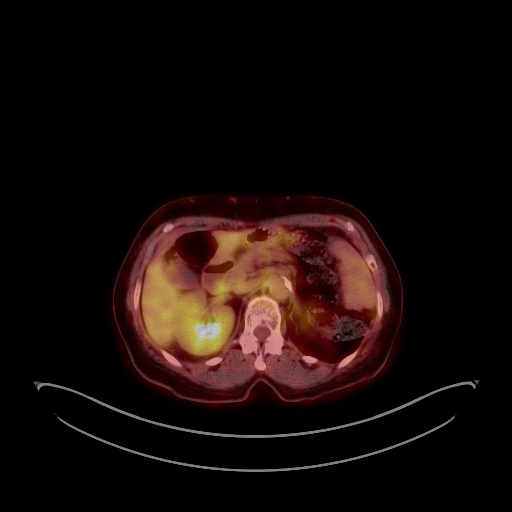
[im 195/195]
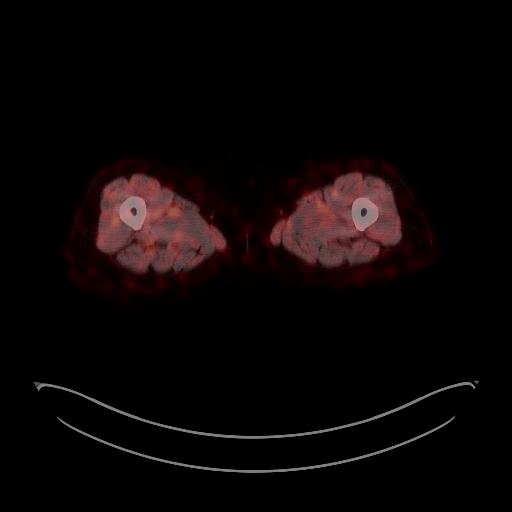

[Series 1032: results mm oncology reading · 0.50mm/px · 1 of 2 slices shown]
[im 1/2]
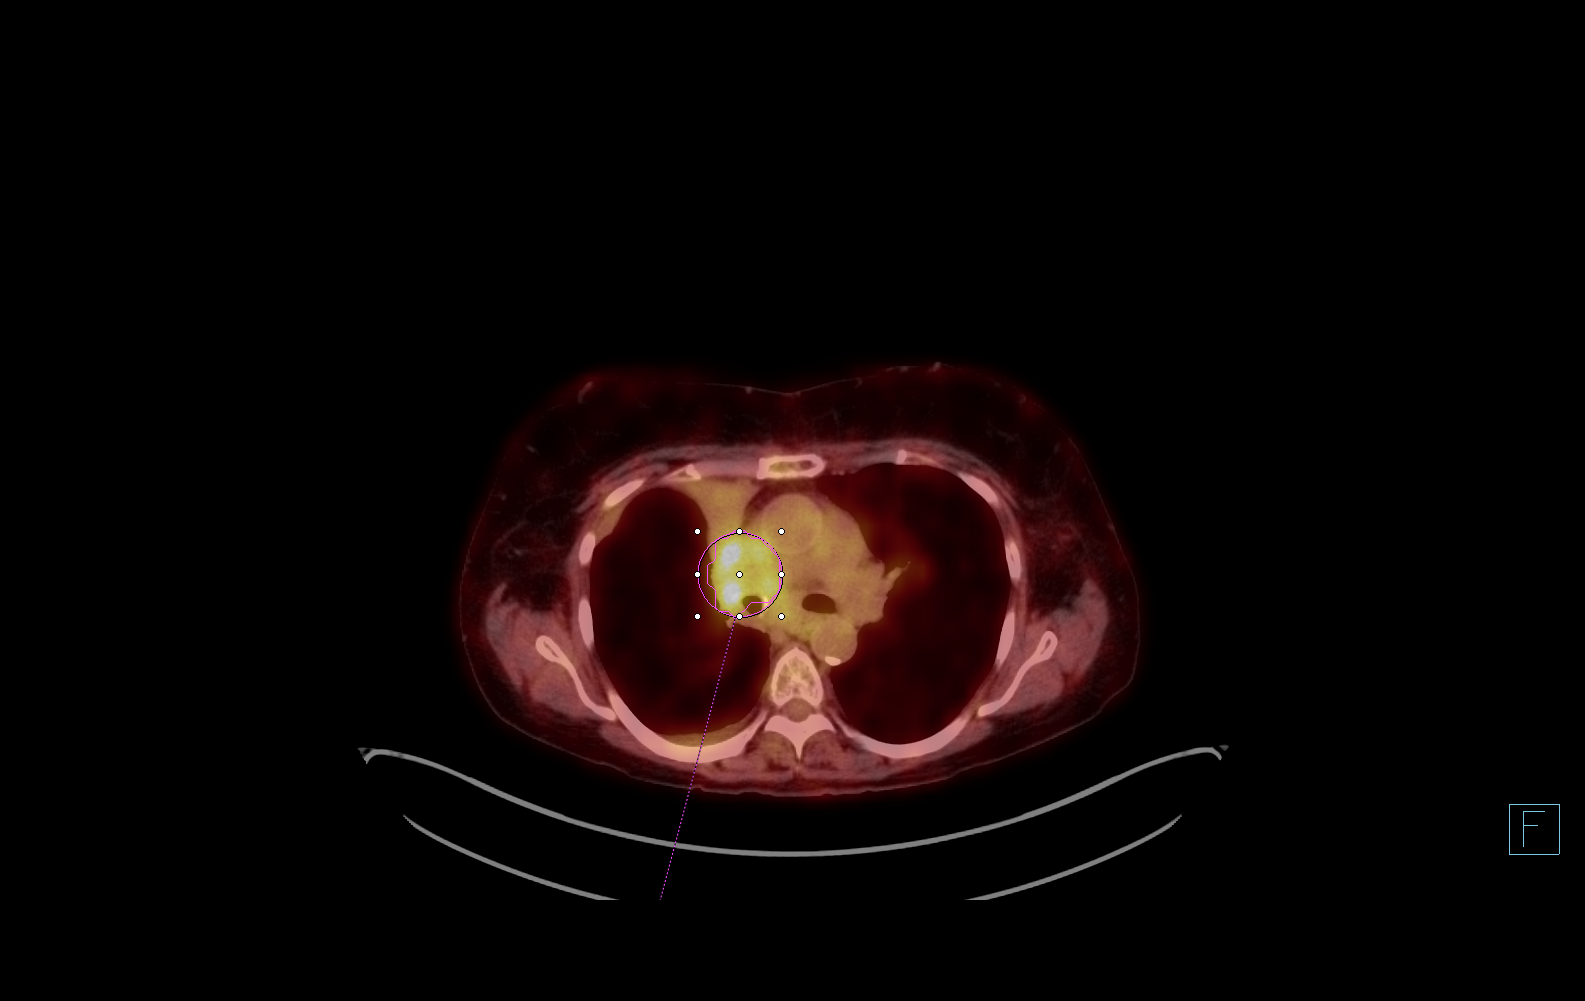

[20 of 25 positions shown; findings below may reference images not displayed]

FINDINGS: NECK: No hypermetabolic lymph nodes in the neck.

CHEST: Small right pleural effusion noted. The central right
perihilar and right paratracheal lung mass exhibits intense FDG
uptake with an SUV max equal to 6.28. There is occlusion of the
right upper lobe bronchus with postobstructive consolidation and
atelectasis which is now involving the entire right upper lobe.

There is a cavitary lung nodule within the left upper lobe which
measures 2.4 cm and has an SUV max equal to 9.73.

No hypermetabolic sub- carinal, left-sided mediastinal or left hilar
hypermetabolic adenopathy.

ABDOMEN/PELVIS: No abnormal hypermetabolic activity within the
liver, pancreas, adrenal glands, or spleen. Aortic atherosclerosis
noted. No significant FDG uptake associated with the low-attenuation
right adrenal nodule compatible with benign adenoma. No
hypermetabolic lymph nodes in the abdomen or pelvis.

SKELETON: No focal hypermetabolic activity to suggest skeletal
metastasis.
IMPRESSION: 1. Central obstructing right paratracheal and right hilar lung mass
exhibits intense FDG uptake and there is associated complete
atelectasis and consolidation of the right upper lobe.
2. Cavitary lung lesion within the left upper lobe is also
hypermetabolic and may represent a focus of metastatic disease or a
second primary lung neoplasm.
3. No evidence for distant hypermetabolic metastasis
4. Aortic atherosclerosis.

## 2018-10-25 IMAGING — DX DG CHEST DECUBITUS*R*
2 series · 2 of 2 positions shown · non-contrast
Comparison: 04/11/2017

CLINICAL DATA: Recent pneumonia.  History of lung cancer

EXAM:
CHEST - RIGHT DECUBITUS

[chest decu (1 of 2)]
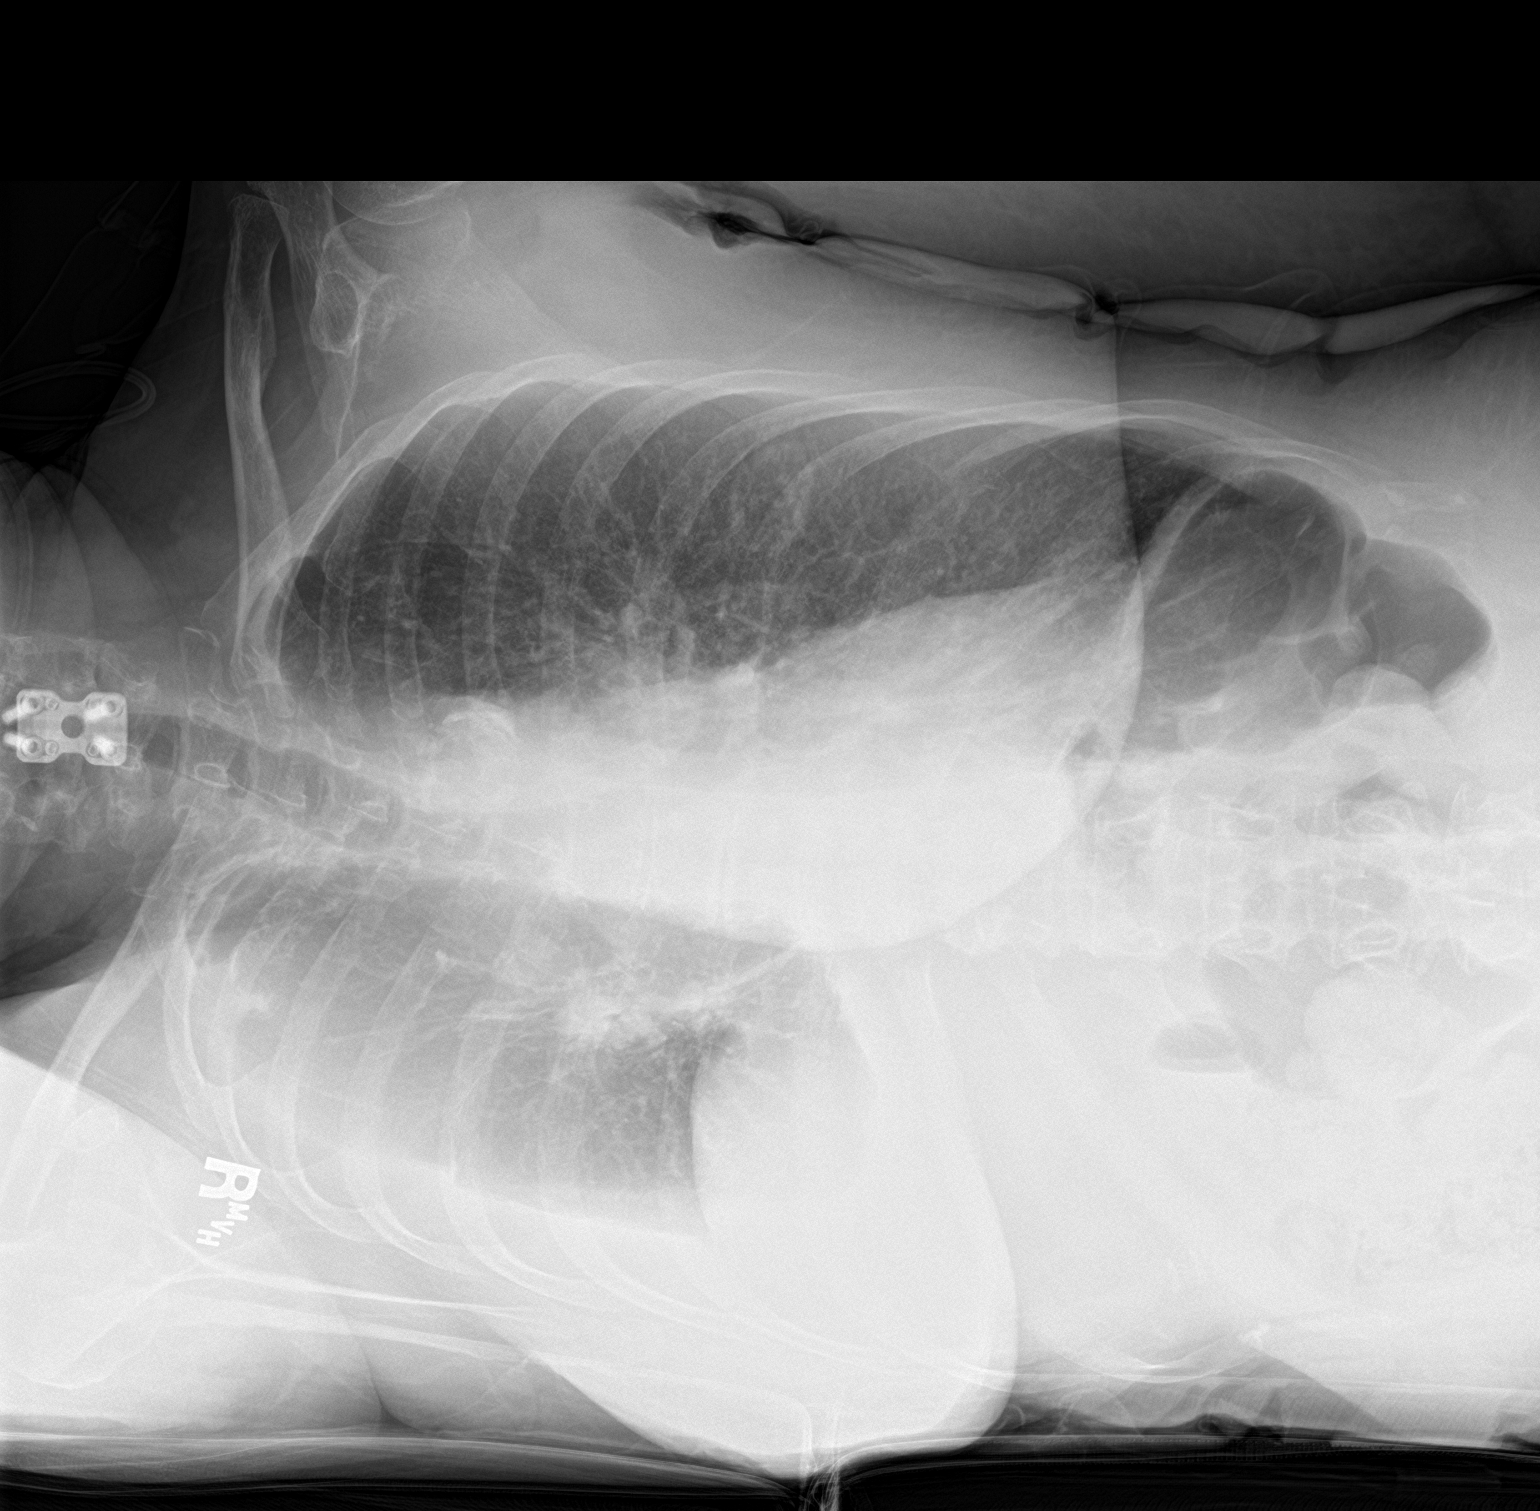

[chest decu (2 of 2)]
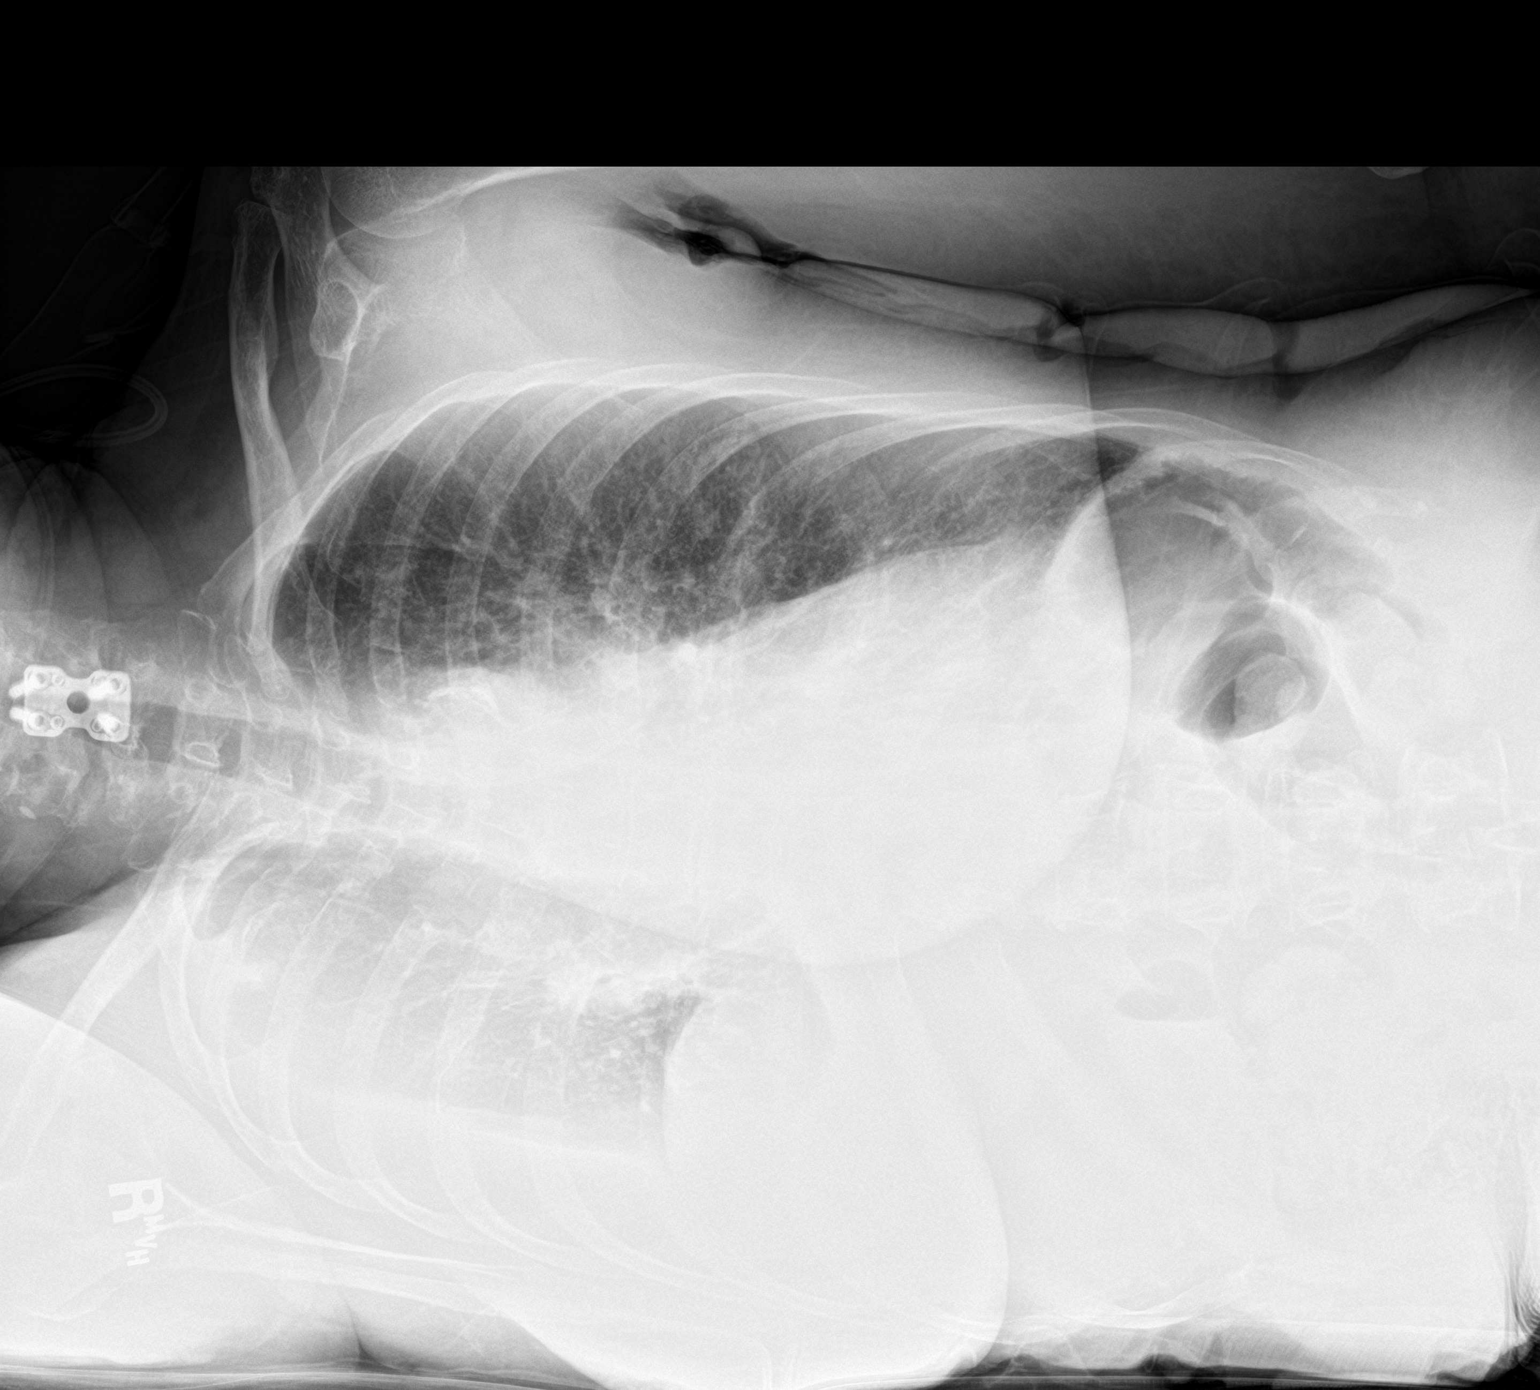

[2 of 2 positions shown; findings below may reference images not displayed]

FINDINGS: Postsurgical changes in the right hilum.

Small layering right pleural effusion. No left pleural effusion. No
pneumothorax. Stable cardiomediastinal silhouette. No acute osseous
abnormality.
IMPRESSION: Small layering right pleural effusion.
